# Patient Record
Sex: Female | Born: 1960 | Race: White | Hispanic: No | Marital: Single | Smoking: Current every day smoker
Health system: Southern US, Community
[De-identification: ages and names within clinical notes are randomized; demographics above are authoritative.]

## PROBLEM LIST (undated history)

## (undated) DIAGNOSIS — K279 Peptic ulcer, site unspecified, unspecified as acute or chronic, without hemorrhage or perforation: Secondary | ICD-10-CM

## (undated) DIAGNOSIS — K922 Gastrointestinal hemorrhage, unspecified: Secondary | ICD-10-CM

## (undated) DIAGNOSIS — F419 Anxiety disorder, unspecified: Secondary | ICD-10-CM

## (undated) DIAGNOSIS — R0789 Other chest pain: Secondary | ICD-10-CM

## (undated) DIAGNOSIS — K219 Gastro-esophageal reflux disease without esophagitis: Secondary | ICD-10-CM

## (undated) DIAGNOSIS — R52 Pain, unspecified: Secondary | ICD-10-CM

## (undated) DIAGNOSIS — F329 Major depressive disorder, single episode, unspecified: Secondary | ICD-10-CM

## (undated) DIAGNOSIS — G8929 Other chronic pain: Secondary | ICD-10-CM

## (undated) DIAGNOSIS — R87629 Unspecified abnormal cytological findings in specimens from vagina: Secondary | ICD-10-CM

## (undated) DIAGNOSIS — M47816 Spondylosis without myelopathy or radiculopathy, lumbar region: Secondary | ICD-10-CM

## (undated) DIAGNOSIS — M549 Dorsalgia, unspecified: Secondary | ICD-10-CM

## (undated) DIAGNOSIS — C539 Malignant neoplasm of cervix uteri, unspecified: Secondary | ICD-10-CM

## (undated) DIAGNOSIS — M797 Fibromyalgia: Secondary | ICD-10-CM

## (undated) DIAGNOSIS — F32A Depression, unspecified: Secondary | ICD-10-CM

## (undated) DIAGNOSIS — E785 Hyperlipidemia, unspecified: Secondary | ICD-10-CM

## (undated) DIAGNOSIS — G894 Chronic pain syndrome: Secondary | ICD-10-CM

## (undated) DIAGNOSIS — J449 Chronic obstructive pulmonary disease, unspecified: Secondary | ICD-10-CM

## (undated) DIAGNOSIS — M1990 Unspecified osteoarthritis, unspecified site: Secondary | ICD-10-CM

## (undated) DIAGNOSIS — T79A0XA Compartment syndrome, unspecified, initial encounter: Secondary | ICD-10-CM

## (undated) HISTORY — PX: TOTAL ABDOMINAL HYSTERECTOMY: SHX209

## (undated) HISTORY — DX: Unspecified abnormal cytological findings in specimens from vagina: R87.629

## (undated) HISTORY — DX: Fibromyalgia: M79.7

## (undated) HISTORY — DX: Anxiety disorder, unspecified: F41.9

## (undated) HISTORY — DX: Unspecified osteoarthritis, unspecified site: M19.90

## (undated) HISTORY — DX: Spondylosis without myelopathy or radiculopathy, lumbar region: M47.816

## (undated) HISTORY — DX: Compartment syndrome, unspecified, initial encounter: T79.A0XA

## (undated) HISTORY — DX: Major depressive disorder, single episode, unspecified: F32.9

## (undated) HISTORY — PX: THROAT SURGERY: SHX803

## (undated) HISTORY — PX: OTHER SURGICAL HISTORY: SHX169

## (undated) HISTORY — PX: TUBAL LIGATION: SHX77

## (undated) HISTORY — DX: Hyperlipidemia, unspecified: E78.5

## (undated) HISTORY — DX: Depression, unspecified: F32.A

## (undated) HISTORY — DX: Malignant neoplasm of cervix uteri, unspecified: C53.9

## (undated) HISTORY — DX: Peptic ulcer, site unspecified, unspecified as acute or chronic, without hemorrhage or perforation: K27.9

---

## 1998-11-28 ENCOUNTER — Ambulatory Visit (HOSPITAL_COMMUNITY): Admission: RE | Admit: 1998-11-28 | Discharge: 1998-11-28 | Payer: Self-pay | Admitting: Neurosurgery

## 1998-12-01 ENCOUNTER — Ambulatory Visit: Admission: RE | Admit: 1998-12-01 | Discharge: 1998-12-01 | Payer: Self-pay | Admitting: Internal Medicine

## 1998-12-08 ENCOUNTER — Ambulatory Visit (HOSPITAL_COMMUNITY): Admission: RE | Admit: 1998-12-08 | Discharge: 1998-12-08 | Payer: Self-pay | Admitting: Neurosurgery

## 1999-03-17 ENCOUNTER — Ambulatory Visit (HOSPITAL_COMMUNITY): Admission: RE | Admit: 1999-03-17 | Discharge: 1999-03-17 | Payer: Self-pay | Admitting: Neurosurgery

## 2000-12-12 ENCOUNTER — Ambulatory Visit (HOSPITAL_COMMUNITY): Admission: RE | Admit: 2000-12-12 | Discharge: 2000-12-12 | Payer: Self-pay | Admitting: Family Medicine

## 2000-12-12 ENCOUNTER — Encounter: Payer: Self-pay | Admitting: Family Medicine

## 2002-08-14 ENCOUNTER — Emergency Department (HOSPITAL_COMMUNITY): Admission: EM | Admit: 2002-08-14 | Discharge: 2002-08-14 | Payer: Self-pay | Admitting: Emergency Medicine

## 2002-08-14 ENCOUNTER — Encounter: Payer: Self-pay | Admitting: Emergency Medicine

## 2002-08-29 ENCOUNTER — Emergency Department (HOSPITAL_COMMUNITY): Admission: EM | Admit: 2002-08-29 | Discharge: 2002-08-29 | Payer: Self-pay | Admitting: Emergency Medicine

## 2002-08-30 ENCOUNTER — Encounter (HOSPITAL_COMMUNITY): Admission: RE | Admit: 2002-08-30 | Discharge: 2002-09-29 | Payer: Self-pay | Admitting: Emergency Medicine

## 2002-09-30 ENCOUNTER — Encounter (HOSPITAL_COMMUNITY): Admission: RE | Admit: 2002-09-30 | Discharge: 2002-10-30 | Payer: Self-pay | Admitting: General Surgery

## 2003-09-22 ENCOUNTER — Emergency Department (HOSPITAL_COMMUNITY): Admission: EM | Admit: 2003-09-22 | Discharge: 2003-09-22 | Payer: Self-pay | Admitting: Emergency Medicine

## 2003-11-11 ENCOUNTER — Ambulatory Visit (HOSPITAL_COMMUNITY): Admission: RE | Admit: 2003-11-11 | Discharge: 2003-11-11 | Payer: Self-pay | Admitting: Family Medicine

## 2004-01-15 ENCOUNTER — Ambulatory Visit (HOSPITAL_COMMUNITY): Admission: RE | Admit: 2004-01-15 | Discharge: 2004-01-15 | Payer: Self-pay | Admitting: Orthopedic Surgery

## 2004-01-19 ENCOUNTER — Emergency Department (HOSPITAL_COMMUNITY): Admission: EM | Admit: 2004-01-19 | Discharge: 2004-01-19 | Payer: Self-pay | Admitting: Emergency Medicine

## 2005-02-24 ENCOUNTER — Inpatient Hospital Stay (HOSPITAL_COMMUNITY): Admission: AD | Admit: 2005-02-24 | Discharge: 2005-03-02 | Payer: Self-pay | Admitting: Internal Medicine

## 2005-02-24 ENCOUNTER — Ambulatory Visit: Payer: Self-pay | Admitting: Internal Medicine

## 2005-02-25 ENCOUNTER — Encounter: Payer: Self-pay | Admitting: Internal Medicine

## 2005-03-30 ENCOUNTER — Ambulatory Visit (HOSPITAL_COMMUNITY): Admission: RE | Admit: 2005-03-30 | Discharge: 2005-03-30 | Payer: Self-pay | Admitting: Internal Medicine

## 2005-04-07 ENCOUNTER — Encounter (INDEPENDENT_AMBULATORY_CARE_PROVIDER_SITE_OTHER): Payer: Self-pay | Admitting: *Deleted

## 2005-04-07 ENCOUNTER — Ambulatory Visit: Payer: Self-pay | Admitting: Internal Medicine

## 2005-04-07 LAB — CONVERTED CEMR LAB
ALT: <8 units/L
AST: 11 units/L
Albumin: 4.5 g/dL
Alkaline Phosphatase: 54 units/L
Total Protein: 7.2 g/dL

## 2005-04-08 ENCOUNTER — Ambulatory Visit (HOSPITAL_COMMUNITY): Admission: RE | Admit: 2005-04-08 | Discharge: 2005-04-08 | Payer: Self-pay | Admitting: Internal Medicine

## 2005-04-13 ENCOUNTER — Ambulatory Visit (HOSPITAL_COMMUNITY): Admission: RE | Admit: 2005-04-13 | Discharge: 2005-04-13 | Payer: Self-pay | Admitting: Internal Medicine

## 2005-04-18 ENCOUNTER — Emergency Department (HOSPITAL_COMMUNITY): Admission: EM | Admit: 2005-04-18 | Discharge: 2005-04-18 | Payer: Self-pay | Admitting: Emergency Medicine

## 2005-04-20 ENCOUNTER — Ambulatory Visit (HOSPITAL_COMMUNITY): Admission: RE | Admit: 2005-04-20 | Discharge: 2005-04-20 | Payer: Self-pay | Admitting: Internal Medicine

## 2005-04-20 ENCOUNTER — Ambulatory Visit: Payer: Self-pay | Admitting: Internal Medicine

## 2005-04-27 ENCOUNTER — Ambulatory Visit: Payer: Self-pay | Admitting: Internal Medicine

## 2005-04-28 ENCOUNTER — Encounter (HOSPITAL_COMMUNITY): Admission: RE | Admit: 2005-04-28 | Discharge: 2005-05-28 | Payer: Self-pay | Admitting: Internal Medicine

## 2005-05-03 ENCOUNTER — Ambulatory Visit (HOSPITAL_COMMUNITY): Admission: RE | Admit: 2005-05-03 | Discharge: 2005-05-03 | Payer: Self-pay | Admitting: Gastroenterology

## 2005-05-11 ENCOUNTER — Emergency Department (HOSPITAL_COMMUNITY): Admission: EM | Admit: 2005-05-11 | Discharge: 2005-05-11 | Payer: Self-pay | Admitting: Emergency Medicine

## 2005-05-12 ENCOUNTER — Emergency Department (HOSPITAL_COMMUNITY): Admission: EM | Admit: 2005-05-12 | Discharge: 2005-05-12 | Payer: Self-pay | Admitting: Emergency Medicine

## 2005-05-18 ENCOUNTER — Ambulatory Visit (HOSPITAL_COMMUNITY): Admission: RE | Admit: 2005-05-18 | Discharge: 2005-05-18 | Payer: Self-pay | Admitting: Family Medicine

## 2005-06-01 ENCOUNTER — Ambulatory Visit (HOSPITAL_COMMUNITY): Admission: RE | Admit: 2005-06-01 | Discharge: 2005-06-01 | Payer: Self-pay | Admitting: Unknown Physician Specialty

## 2005-06-03 ENCOUNTER — Ambulatory Visit: Payer: Self-pay | Admitting: Family Medicine

## 2005-07-12 ENCOUNTER — Ambulatory Visit: Payer: Self-pay | Admitting: Orthopedic Surgery

## 2005-08-06 ENCOUNTER — Encounter (INDEPENDENT_AMBULATORY_CARE_PROVIDER_SITE_OTHER): Payer: Self-pay | Admitting: Internal Medicine

## 2005-08-06 LAB — CONVERTED CEMR LAB
ALT: 16 units/L
AST: 15 units/L
Alkaline Phosphatase: 91 units/L
BUN: 13 mg/dL
Calcium: 9.3 mg/dL
Chloride: 105 meq/L
Creatinine, Ser: 0.8 mg/dL
HCT: 41.1 %
MCV: 92.2 fL
Platelets: 324 10*3/uL
RDW: 13.9 %
Sed Rate: 11 mm/hr
Total Bilirubin: 0.4 mg/dL
WBC: 6.9 10*3/uL

## 2005-08-10 ENCOUNTER — Emergency Department (HOSPITAL_COMMUNITY): Admission: EM | Admit: 2005-08-10 | Discharge: 2005-08-10 | Payer: Self-pay | Admitting: Emergency Medicine

## 2005-08-21 ENCOUNTER — Emergency Department (HOSPITAL_COMMUNITY): Admission: EM | Admit: 2005-08-21 | Discharge: 2005-08-21 | Payer: Self-pay | Admitting: Emergency Medicine

## 2005-08-25 ENCOUNTER — Encounter (INDEPENDENT_AMBULATORY_CARE_PROVIDER_SITE_OTHER): Payer: Self-pay | Admitting: Internal Medicine

## 2005-09-27 ENCOUNTER — Inpatient Hospital Stay (HOSPITAL_COMMUNITY): Admission: RE | Admit: 2005-09-27 | Discharge: 2005-09-29 | Payer: Self-pay | Admitting: Obstetrics and Gynecology

## 2005-09-27 ENCOUNTER — Encounter (INDEPENDENT_AMBULATORY_CARE_PROVIDER_SITE_OTHER): Payer: Self-pay | Admitting: *Deleted

## 2005-10-19 ENCOUNTER — Ambulatory Visit: Payer: Self-pay | Admitting: Internal Medicine

## 2005-10-21 ENCOUNTER — Emergency Department (HOSPITAL_COMMUNITY): Admission: EM | Admit: 2005-10-21 | Discharge: 2005-10-21 | Payer: Self-pay | Admitting: Emergency Medicine

## 2005-11-01 ENCOUNTER — Encounter (INDEPENDENT_AMBULATORY_CARE_PROVIDER_SITE_OTHER): Payer: Self-pay | Admitting: Internal Medicine

## 2005-11-03 ENCOUNTER — Ambulatory Visit: Payer: Self-pay | Admitting: Internal Medicine

## 2005-11-23 ENCOUNTER — Ambulatory Visit: Payer: Self-pay | Admitting: Internal Medicine

## 2005-12-02 ENCOUNTER — Encounter (INDEPENDENT_AMBULATORY_CARE_PROVIDER_SITE_OTHER): Payer: Self-pay | Admitting: Internal Medicine

## 2005-12-27 ENCOUNTER — Emergency Department (HOSPITAL_COMMUNITY): Admission: EM | Admit: 2005-12-27 | Discharge: 2005-12-27 | Payer: Self-pay | Admitting: Emergency Medicine

## 2006-01-03 ENCOUNTER — Encounter (HOSPITAL_COMMUNITY): Admission: RE | Admit: 2006-01-03 | Discharge: 2006-01-22 | Payer: Self-pay | Admitting: Orthopedic Surgery

## 2006-01-07 ENCOUNTER — Encounter (INDEPENDENT_AMBULATORY_CARE_PROVIDER_SITE_OTHER): Payer: Self-pay | Admitting: Internal Medicine

## 2006-05-23 ENCOUNTER — Encounter (INDEPENDENT_AMBULATORY_CARE_PROVIDER_SITE_OTHER): Payer: Self-pay | Admitting: Internal Medicine

## 2006-08-12 ENCOUNTER — Encounter (INDEPENDENT_AMBULATORY_CARE_PROVIDER_SITE_OTHER): Payer: Self-pay | Admitting: Internal Medicine

## 2006-10-19 ENCOUNTER — Emergency Department (HOSPITAL_COMMUNITY): Admission: EM | Admit: 2006-10-19 | Discharge: 2006-10-19 | Payer: Self-pay | Admitting: Emergency Medicine

## 2006-10-20 ENCOUNTER — Ambulatory Visit: Payer: Self-pay | Admitting: Orthopedic Surgery

## 2006-10-31 ENCOUNTER — Ambulatory Visit: Payer: Self-pay | Admitting: Internal Medicine

## 2006-10-31 DIAGNOSIS — F1320 Sedative, hypnotic or anxiolytic dependence, uncomplicated: Secondary | ICD-10-CM | POA: Insufficient documentation

## 2006-10-31 DIAGNOSIS — M542 Cervicalgia: Secondary | ICD-10-CM

## 2006-10-31 DIAGNOSIS — K279 Peptic ulcer, site unspecified, unspecified as acute or chronic, without hemorrhage or perforation: Secondary | ICD-10-CM | POA: Insufficient documentation

## 2006-10-31 DIAGNOSIS — Z8719 Personal history of other diseases of the digestive system: Secondary | ICD-10-CM

## 2006-10-31 DIAGNOSIS — M79609 Pain in unspecified limb: Secondary | ICD-10-CM

## 2006-10-31 DIAGNOSIS — Z8541 Personal history of malignant neoplasm of cervix uteri: Secondary | ICD-10-CM

## 2006-10-31 DIAGNOSIS — Z87898 Personal history of other specified conditions: Secondary | ICD-10-CM

## 2006-10-31 DIAGNOSIS — K5900 Constipation, unspecified: Secondary | ICD-10-CM | POA: Insufficient documentation

## 2006-10-31 DIAGNOSIS — M129 Arthropathy, unspecified: Secondary | ICD-10-CM | POA: Insufficient documentation

## 2006-10-31 DIAGNOSIS — M545 Low back pain: Secondary | ICD-10-CM

## 2006-10-31 DIAGNOSIS — F319 Bipolar disorder, unspecified: Secondary | ICD-10-CM

## 2006-10-31 DIAGNOSIS — F411 Generalized anxiety disorder: Secondary | ICD-10-CM | POA: Insufficient documentation

## 2006-10-31 DIAGNOSIS — J309 Allergic rhinitis, unspecified: Secondary | ICD-10-CM | POA: Insufficient documentation

## 2006-10-31 DIAGNOSIS — M109 Gout, unspecified: Secondary | ICD-10-CM

## 2006-11-01 ENCOUNTER — Telehealth (INDEPENDENT_AMBULATORY_CARE_PROVIDER_SITE_OTHER): Payer: Self-pay | Admitting: Internal Medicine

## 2006-11-01 ENCOUNTER — Encounter (INDEPENDENT_AMBULATORY_CARE_PROVIDER_SITE_OTHER): Payer: Self-pay | Admitting: Internal Medicine

## 2006-11-18 ENCOUNTER — Encounter (INDEPENDENT_AMBULATORY_CARE_PROVIDER_SITE_OTHER): Payer: Self-pay | Admitting: Internal Medicine

## 2006-11-21 ENCOUNTER — Ambulatory Visit: Payer: Self-pay | Admitting: Orthopedic Surgery

## 2006-11-22 ENCOUNTER — Ambulatory Visit: Payer: Self-pay | Admitting: Internal Medicine

## 2006-11-22 DIAGNOSIS — F17200 Nicotine dependence, unspecified, uncomplicated: Secondary | ICD-10-CM

## 2006-11-22 DIAGNOSIS — M1990 Unspecified osteoarthritis, unspecified site: Secondary | ICD-10-CM | POA: Insufficient documentation

## 2006-12-01 ENCOUNTER — Telehealth (INDEPENDENT_AMBULATORY_CARE_PROVIDER_SITE_OTHER): Payer: Self-pay | Admitting: Internal Medicine

## 2006-12-07 ENCOUNTER — Telehealth (INDEPENDENT_AMBULATORY_CARE_PROVIDER_SITE_OTHER): Payer: Self-pay | Admitting: Internal Medicine

## 2006-12-14 ENCOUNTER — Telehealth (INDEPENDENT_AMBULATORY_CARE_PROVIDER_SITE_OTHER): Payer: Self-pay | Admitting: *Deleted

## 2006-12-19 ENCOUNTER — Ambulatory Visit: Payer: Self-pay | Admitting: Internal Medicine

## 2006-12-19 DIAGNOSIS — K0530 Chronic periodontitis, unspecified: Secondary | ICD-10-CM | POA: Insufficient documentation

## 2006-12-20 ENCOUNTER — Telehealth (INDEPENDENT_AMBULATORY_CARE_PROVIDER_SITE_OTHER): Payer: Self-pay | Admitting: Internal Medicine

## 2006-12-22 ENCOUNTER — Encounter (INDEPENDENT_AMBULATORY_CARE_PROVIDER_SITE_OTHER): Payer: Self-pay | Admitting: Internal Medicine

## 2006-12-23 ENCOUNTER — Ambulatory Visit (HOSPITAL_BASED_OUTPATIENT_CLINIC_OR_DEPARTMENT_OTHER): Admission: RE | Admit: 2006-12-23 | Discharge: 2006-12-23 | Payer: Self-pay | Admitting: Dentistry

## 2006-12-28 ENCOUNTER — Telehealth (INDEPENDENT_AMBULATORY_CARE_PROVIDER_SITE_OTHER): Payer: Self-pay | Admitting: Internal Medicine

## 2007-01-09 ENCOUNTER — Encounter (INDEPENDENT_AMBULATORY_CARE_PROVIDER_SITE_OTHER): Payer: Self-pay | Admitting: Internal Medicine

## 2007-02-06 ENCOUNTER — Telehealth (INDEPENDENT_AMBULATORY_CARE_PROVIDER_SITE_OTHER): Payer: Self-pay | Admitting: Internal Medicine

## 2007-02-15 ENCOUNTER — Encounter (INDEPENDENT_AMBULATORY_CARE_PROVIDER_SITE_OTHER): Payer: Self-pay | Admitting: Internal Medicine

## 2007-02-22 ENCOUNTER — Encounter (INDEPENDENT_AMBULATORY_CARE_PROVIDER_SITE_OTHER): Payer: Self-pay | Admitting: Internal Medicine

## 2007-03-22 ENCOUNTER — Encounter (INDEPENDENT_AMBULATORY_CARE_PROVIDER_SITE_OTHER): Payer: Self-pay | Admitting: Internal Medicine

## 2007-04-27 ENCOUNTER — Encounter: Payer: Self-pay | Admitting: Family Medicine

## 2007-05-04 ENCOUNTER — Telehealth (INDEPENDENT_AMBULATORY_CARE_PROVIDER_SITE_OTHER): Payer: Self-pay | Admitting: Internal Medicine

## 2007-05-12 ENCOUNTER — Ambulatory Visit (HOSPITAL_COMMUNITY): Admission: RE | Admit: 2007-05-12 | Discharge: 2007-05-12 | Payer: Self-pay | Admitting: Podiatry

## 2007-06-21 ENCOUNTER — Telehealth (INDEPENDENT_AMBULATORY_CARE_PROVIDER_SITE_OTHER): Payer: Self-pay | Admitting: Internal Medicine

## 2007-06-23 ENCOUNTER — Ambulatory Visit: Payer: Self-pay | Admitting: Internal Medicine

## 2007-08-30 ENCOUNTER — Ambulatory Visit: Payer: Self-pay | Admitting: Internal Medicine

## 2007-08-30 DIAGNOSIS — E538 Deficiency of other specified B group vitamins: Secondary | ICD-10-CM | POA: Insufficient documentation

## 2007-08-30 DIAGNOSIS — R498 Other voice and resonance disorders: Secondary | ICD-10-CM | POA: Insufficient documentation

## 2007-08-31 ENCOUNTER — Telehealth (INDEPENDENT_AMBULATORY_CARE_PROVIDER_SITE_OTHER): Payer: Self-pay | Admitting: *Deleted

## 2007-08-31 ENCOUNTER — Encounter (INDEPENDENT_AMBULATORY_CARE_PROVIDER_SITE_OTHER): Payer: Self-pay | Admitting: Internal Medicine

## 2007-08-31 LAB — CONVERTED CEMR LAB
Amphetamine Screen, Ur: NEGATIVE
Barbiturate Quant, Ur: NEGATIVE
Cocaine Metabolites: NEGATIVE
Ethyl Alcohol: <5 mg/dL (ref <10)
Marijuana Metabolite: NEGATIVE
Opiate Screen, Urine: POSITIVE — AB
Phencyclidine (PCP): NEGATIVE

## 2007-09-08 ENCOUNTER — Encounter (INDEPENDENT_AMBULATORY_CARE_PROVIDER_SITE_OTHER): Payer: Self-pay | Admitting: Internal Medicine

## 2007-09-11 ENCOUNTER — Encounter (INDEPENDENT_AMBULATORY_CARE_PROVIDER_SITE_OTHER): Payer: Self-pay | Admitting: Internal Medicine

## 2007-10-02 ENCOUNTER — Ambulatory Visit (HOSPITAL_COMMUNITY): Admission: RE | Admit: 2007-10-02 | Discharge: 2007-10-02 | Payer: Self-pay | Admitting: Family Medicine

## 2007-11-08 ENCOUNTER — Emergency Department (HOSPITAL_COMMUNITY): Admission: EM | Admit: 2007-11-08 | Discharge: 2007-11-08 | Payer: Self-pay | Admitting: Emergency Medicine

## 2009-05-12 ENCOUNTER — Inpatient Hospital Stay (HOSPITAL_COMMUNITY): Admission: EM | Admit: 2009-05-12 | Discharge: 2009-05-12 | Payer: Self-pay | Admitting: Emergency Medicine

## 2009-05-12 ENCOUNTER — Ambulatory Visit: Payer: Self-pay | Admitting: Cardiology

## 2009-06-10 ENCOUNTER — Emergency Department (HOSPITAL_COMMUNITY): Admission: EM | Admit: 2009-06-10 | Discharge: 2009-06-10 | Payer: Self-pay | Admitting: Emergency Medicine

## 2009-06-30 ENCOUNTER — Encounter (INDEPENDENT_AMBULATORY_CARE_PROVIDER_SITE_OTHER): Payer: Self-pay | Admitting: *Deleted

## 2009-09-10 ENCOUNTER — Encounter (INDEPENDENT_AMBULATORY_CARE_PROVIDER_SITE_OTHER): Payer: Self-pay | Admitting: *Deleted

## 2010-02-17 ENCOUNTER — Ambulatory Visit: Payer: Self-pay | Admitting: Internal Medicine

## 2010-02-18 ENCOUNTER — Ambulatory Visit (HOSPITAL_COMMUNITY): Admission: RE | Admit: 2010-02-18 | Discharge: 2010-02-18 | Payer: Self-pay | Admitting: Internal Medicine

## 2010-02-23 ENCOUNTER — Ambulatory Visit (HOSPITAL_COMMUNITY): Admission: RE | Admit: 2010-02-23 | Discharge: 2010-02-23 | Payer: Self-pay | Admitting: Internal Medicine

## 2010-02-23 ENCOUNTER — Ambulatory Visit: Payer: Self-pay | Admitting: Internal Medicine

## 2010-03-20 ENCOUNTER — Inpatient Hospital Stay (HOSPITAL_COMMUNITY): Admission: EM | Admit: 2010-03-20 | Discharge: 2010-03-25 | Payer: Self-pay | Admitting: Emergency Medicine

## 2010-03-20 ENCOUNTER — Ambulatory Visit: Payer: Self-pay | Admitting: Cardiology

## 2010-03-22 ENCOUNTER — Ambulatory Visit: Payer: Self-pay | Admitting: Gastroenterology

## 2010-03-24 ENCOUNTER — Encounter: Payer: Self-pay | Admitting: Cardiology

## 2010-05-11 ENCOUNTER — Encounter (INDEPENDENT_AMBULATORY_CARE_PROVIDER_SITE_OTHER): Payer: Self-pay | Admitting: *Deleted

## 2010-05-17 ENCOUNTER — Encounter: Payer: Self-pay | Admitting: Orthopedic Surgery

## 2010-05-28 NOTE — Miscellaneous (Signed)
Summary: labs 04/07/2005 liver  Clinical Lists Changes  Observations: Added new observation of ALBUMIN: 4.5 g/dL (91/47/8295 62:13) Added new observation of PROTEIN, TOT: 7.2 g/dL (08/65/7846 96:29) Added new observation of SGPT (ALT): <8 units/L (04/07/2005 11:18) Added new observation of SGOT (AST): 11 units/L (04/07/2005 11:18) Added new observation of ALK PHOS: 54 units/L (04/07/2005 11:18) Added new observation of BILI DIRECT: 0.1 mg/dL (52/84/1324 40:10)

## 2010-05-28 NOTE — Letter (Signed)
Summary: rpc chart  rpc chart   Imported By: Curtis Sites 01/16/2010 09:19:27  _____________________________________________________________________  External Attachment:    Type:   Image     Comment:   External Document

## 2010-05-28 NOTE — Miscellaneous (Signed)
Summary: CONSULTATION  Clinical Lists Changes [name], ROLLER           ACCOUNT NO.:  [id number]      MEDICAL RECORD NO.:  [id number]           PATIENT TYPE:      LOCATION:                                 FACILITY:      PHYSICIAN:  Jonette Eva, M.D.     DATE OF BIRTH:  [birth year]      DATE OF CONSULTATION:  03/22/2010   DATE OF DISCHARGE:                                    CONSULTATION         PRIMARY GASTROENTEROLOGIST:  Dr. Karilyn Cota.      PRIMARY PHYSICIAN:  Dr. Renard Matter.      PRIMARY PAIN SPECIALIST:  Dr. Hyman Bower, [place], [place].      REASON FOR CONSULTATION:  Chronic abdominal pain.      HPI:  [name] is a 50 year old female who has a significant past   medical history of anxiety and chronic pain.  She was last seen and   evaluated by Dr. Karilyn Cota for nausea in October 2011.  She had a CT scan   which showed diverticulosis and a small umbilical hernia.  It was   otherwise unremarkable.  She also had an EGD which was normal.  He   recommended ordering an ultrasound which was not performed.      She presents on a daily acid reducer which she bought at the dollar   store.  She said she cannot afford Dexilant.  She has been using her GI   cocktail.  She complains of epigastric pain and left upper quadrant pain   that is sharp.  It may be so severe that it makes her feel like she has   to bend over.  Her chest pain is a separate issue.  She has a bowel   movement every morning with a stool laxative.  She denies any blood in   her stool.  She has had 4 episodes of vomiting since October.  She   regurgitates 2 to 3 times a day.  She has intermittent nausea.  She   continues to smoke 1/2 a pack a day.  Her BMI is 28 consistent with   being overweight.      PAST MEDICAL HISTORY:   1. History of gout.   2. Peptic ulcer disease.   3. Tobacco abuse.      PAST SURGICAL HISTORY:   1. Tubal ligation.   2. Total abdominal hysterectomy.      ALLERGIES:   VICODIN.      MEDICATIONS:   1. Xanax.   2. Aspirin.   3. MS Contin 30 mg b.i.d.   4. Lovenox daily.   5. OxyContin 20 mg b.i.d. with 10 mg p.o. b.i.d.   6. Protonix daily.   7. Ambien q.h.s.   8. GI cocktail as needed.      FAMILY HISTORY:  She has a family history of coronary artery disease,   diabetes, and hypertension.      SOCIAL HISTORY:  She has a significant other.  She smokes 1/2 a pack a   day.  REVIEW OF SYSTEMS:  Patient stopped her Cymbalta after 2 weeks.  She   could not really give me an explanation why.  Review of systems is per   the HPI, otherwise all systems are negative.      PHYSICAL EXAM:  T-max 98.2.  Systolic blood pressure 117 to 100.   GENERAL:  She is in no apparent distress.  Alert and oriented x4.   HEENT:  Atraumatic, normocephalic.  Pupils equal and reactive to light.   Mouth, no oral lesions.   NECK:  Full range of motion.  No lymphadenopathy.   LUNGS:  Clear to auscultation bilaterally.   CARDIOVASCULAR:  Regular rhythm.  No murmur.  Normal S1 and S2.   ABDOMEN:  Bowel sounds present, soft, nontender, nondistended.  No   rebound or guarding.   EXTREMITIES:  No cyanosis or edema.   NEURO:  She has no focal neurologic deficits.      LABS:  White count 8.7, hemoglobin 14.8, platelets 281, creatinine 0.97.   normal hepatic function panel.  UA negative.      RADIOGRAPHIC STUDIES:  Portable chest x-ray on March 20, 2010, showed   no acute cardiopulmonary disease.      ASSESSMENT:  [name] is a 50 year old female who has chest pain and   left upper quadrant pain likely secondary to reflux and/or non-ulcer   dyspepsia.  She has a low likelihood of having esophageal spasm.      Thank you for allowing me to see [name] in consultation.  My   recommendations follow.      RECOMMENDATIONS:   1. We will order an ultrasound for tomorrow to complete her outpatient       workup.   2. B.i.d. PPI at 7 a.m. and 5 p.m.   3. Would add  imipramine for chronic pain and to treat non-ulcer       dyspepsia and depression.   4. She should follow a low-fat diet.   5. We will order a barium swallow to see if she has any evidence of       esophageal spasm as a reason for her chest pain.   6. Dr. Karilyn Cota will follow the patient on March 23, 2010.                     Jonette Eva, M.D.               SF/MEDQ  D:  03/22/2010  T:  03/22/2010  Job:  [id number]      cc:   Dr. Chrisandra Netters, M.D.   Fax: [phone]      Electronically Signed by Jonette Eva M.D. on 05/07/2010 65:78:46 PM

## 2010-05-28 NOTE — Letter (Signed)
Summary: Appointment - Missed  [name] at [facility]  [address], [place] [address]   Phone: [phone]  Fax: [phone]     Sep 10, 2009 MRN: [id number]   [name] [address] [place], [place]  [address]   Dear [name],  Our records indicate you missed your appointment on      09/10/09                  with Dr. Diona Browner            It is very important that we reach you to reschedule this appointment. We look forward to participating in your health care needs. Please contact us at the number listed above at your earliest convenience to reschedule this appointment.     Sincerely,    Glass blower/designer

## 2010-07-07 LAB — BASIC METABOLIC PANEL
BUN: 13 mg/dL (ref 6–23)
Creatinine, Ser: 0.97 mg/dL (ref 0.4–1.2)
GFR calc non Af Amer: >60 mL/min (ref >60)
Glucose, Bld: 93 mg/dL (ref 70–99)
Potassium: 3.6 mEq/L (ref 3.5–5.1)

## 2010-07-07 LAB — LIPID PANEL
Cholesterol: 239 mg/dL — ABNORMAL HIGH (ref 0–200)
LDL Cholesterol: 158 mg/dL — ABNORMAL HIGH (ref 0–99)
Triglycerides: 220 mg/dL — ABNORMAL HIGH (ref <150)

## 2010-07-07 LAB — URINALYSIS, ROUTINE W REFLEX MICROSCOPIC
Bilirubin Urine: NEGATIVE
Ketones, ur: NEGATIVE mg/dL
Nitrite: NEGATIVE
Protein, ur: NEGATIVE mg/dL
pH: 5.5 (ref 5.0–8.0)

## 2010-07-07 LAB — CARDIAC PANEL(CRET KIN+CKTOT+MB+TROPI)
CK, MB: 0.4 ng/mL (ref 0.3–4.0)
CK, MB: 0.5 ng/mL (ref 0.3–4.0)
Relative Index: INVALID (ref 0.0–2.5)
Total CK: 50 U/L (ref 7–177)
Troponin I: 0.01 ng/mL (ref 0.00–0.06)
Troponin I: <0.01 ng/mL (ref 0.00–0.06)
Troponin I: <0.01 ng/mL (ref 0.00–0.06)

## 2010-07-07 LAB — CBC
HCT: 42.5 % (ref 36.0–46.0)
MCHC: 34.8 g/dL (ref 30.0–36.0)
MCV: 88.5 fL (ref 78.0–100.0)
Platelets: 281 10*3/uL (ref 150–400)
RDW: 13.4 % (ref 11.5–15.5)

## 2010-07-07 LAB — POCT CARDIAC MARKERS
CKMB, poc: <1 ng/mL — ABNORMAL LOW (ref 1.0–8.0)
Myoglobin, poc: 41.5 ng/mL (ref 12–200)

## 2010-07-07 LAB — HEPATIC FUNCTION PANEL
Bilirubin, Direct: 0.1 mg/dL (ref 0.0–0.3)
Indirect Bilirubin: 0 mg/dL — ABNORMAL LOW (ref 0.3–0.9)
Total Protein: 7.7 g/dL (ref 6.0–8.3)

## 2010-07-07 LAB — DIFFERENTIAL
Basophils Absolute: 0 10*3/uL (ref 0.0–0.1)
Basophils Relative: 0 % (ref 0–1)
Eosinophils Relative: 4 % (ref 0–5)
Monocytes Absolute: 0.5 10*3/uL (ref 0.1–1.0)

## 2010-07-07 LAB — TSH: TSH: 1.23 u[IU]/mL (ref 0.350–4.500)

## 2010-07-08 LAB — BASIC METABOLIC PANEL
CO2: 30 mEq/L (ref 19–32)
Chloride: 98 mEq/L (ref 96–112)
Creatinine, Ser: 1.04 mg/dL (ref 0.4–1.2)
GFR calc Af Amer: >60 mL/min (ref >60)

## 2010-07-08 LAB — HEMOGLOBIN AND HEMATOCRIT, BLOOD: HCT: 43.1 % (ref 36.0–46.0)

## 2010-07-12 LAB — CBC
HCT: 43.3 % (ref 36.0–46.0)
Hemoglobin: 14.8 g/dL (ref 12.0–15.0)
RDW: 14.5 % (ref 11.5–15.5)

## 2010-07-12 LAB — D-DIMER, QUANTITATIVE: D-Dimer, Quant: 0.66 ug/mL-FEU — ABNORMAL HIGH (ref 0.00–0.48)

## 2010-07-12 LAB — COMPREHENSIVE METABOLIC PANEL
Alkaline Phosphatase: 142 U/L — ABNORMAL HIGH (ref 39–117)
BUN: 7 mg/dL (ref 6–23)
Chloride: 101 mEq/L (ref 96–112)
GFR calc non Af Amer: >60 mL/min (ref >60)
Glucose, Bld: 114 mg/dL — ABNORMAL HIGH (ref 70–99)
Potassium: 3.8 mEq/L (ref 3.5–5.1)
Total Bilirubin: 0.2 mg/dL — ABNORMAL LOW (ref 0.3–1.2)
Total Protein: 6.6 g/dL (ref 6.0–8.3)

## 2010-07-12 LAB — URINALYSIS, ROUTINE W REFLEX MICROSCOPIC
Bilirubin Urine: NEGATIVE
Hgb urine dipstick: NEGATIVE
Protein, ur: NEGATIVE mg/dL
Urobilinogen, UA: 0.2 mg/dL (ref 0.0–1.0)

## 2010-07-12 LAB — DIFFERENTIAL
Basophils Absolute: 0 10*3/uL (ref 0.0–0.1)
Basophils Relative: 1 % (ref 0–1)
Neutro Abs: 3.3 10*3/uL (ref 1.7–7.7)
Neutrophils Relative %: 55 % (ref 43–77)

## 2010-07-12 LAB — POCT CARDIAC MARKERS
CKMB, poc: <1 ng/mL — ABNORMAL LOW (ref 1.0–8.0)
CKMB, poc: <1 ng/mL — ABNORMAL LOW (ref 1.0–8.0)
Troponin i, poc: <0.05 ng/mL (ref 0.00–0.09)
Troponin i, poc: <0.05 ng/mL (ref 0.00–0.09)
Troponin i, poc: <0.05 ng/mL (ref 0.00–0.09)

## 2010-07-12 LAB — CARDIAC PANEL(CRET KIN+CKTOT+MB+TROPI): CK, MB: 1 ng/mL (ref 0.3–4.0)

## 2010-07-15 LAB — POCT CARDIAC MARKERS
CKMB, poc: <1 ng/mL — ABNORMAL LOW (ref 1.0–8.0)
Myoglobin, poc: 37.9 ng/mL (ref 12–200)
Troponin i, poc: <0.05 ng/mL (ref 0.00–0.09)

## 2010-07-15 LAB — BASIC METABOLIC PANEL
GFR calc Af Amer: >60 mL/min (ref >60)
GFR calc non Af Amer: >60 mL/min (ref >60)
Glucose, Bld: 107 mg/dL — ABNORMAL HIGH (ref 70–99)
Potassium: 4 mEq/L (ref 3.5–5.1)
Sodium: 137 mEq/L (ref 135–145)

## 2010-07-15 LAB — DIFFERENTIAL
Basophils Absolute: 0 10*3/uL (ref 0.0–0.1)
Eosinophils Relative: 5 % (ref 0–5)
Lymphocytes Relative: 37 % (ref 12–46)
Lymphs Abs: 2.2 10*3/uL (ref 0.7–4.0)
Monocytes Absolute: 0.6 10*3/uL (ref 0.1–1.0)
Monocytes Relative: 9 % (ref 3–12)

## 2010-07-15 LAB — CBC
HCT: 44.5 % (ref 36.0–46.0)
Hemoglobin: 15.4 g/dL — ABNORMAL HIGH (ref 12.0–15.0)
RBC: 4.8 MIL/uL (ref 3.87–5.11)
RDW: 14.7 % (ref 11.5–15.5)

## 2010-09-08 NOTE — Op Note (Signed)
NAMEJOMAIRA, [name]               ACCOUNT NO.:  [id number]   MEDICAL RECORD NO.:  [id number]          PATIENT TYPE:  AMB   LOCATION:  DSC                          FACILITY:  [facility]   PHYSICIAN:  Clydene Laming., D.D.S.DATE OF BIRTH:  1960/08/26   DATE OF PROCEDURE:  12/23/2006  DATE OF DISCHARGE:                               OPERATIVE REPORT   Patient was premedicated and brought into the OR under her own power and  placed on the table in supine position, where she remained throughout  the entire procedure.  After successful induction of nasal endotracheal  general anesthesia, the patient was prepped and draped in the usual  manner for an intraoral procedure.   Upper left, upper right, lower left and lower right were infiltrated and  blocked with 2% lidocaine, 1epinephrine times nine carpules, 1.8 mL per  carpule.   Upper right and upper left inverse bevel incisions, facial and palatal,  distal wedge, 2 and 15, full-thickness flaps were elevated from mesial  aspects of maxillary canines to the tuberosity.  There was very heavy  calculus interproximally.  The site was debrided, degranulated and  scaled and root planed with hand instrumentation and ultrasonic.  There  was significant cratering and bone loss interproximally between 2 and 3  with class 2 furcation distal of number 3.  The upper right was treated  with osteoplasty, reducing craters somewhat, removing spiny bony  exostosis palatal of number 2.  In the upper right, there was 2-3 wall  defect, mesial of 13 and distal of 14.  Furcations were generally intact  on the upper left.  Placed __________  bone graft at M13 and D14.  There  was further thinning and scalloping of tissues.  Flaps were repositioned  after irrigation and suture 4-0 BSS black silk suture.   On the lower left, pie-shaped distal wedge, distal of number 18,  intrasulcular incision, facial and lingual of number 18.  Full-thickness  flap elevated from  distal of 18 to the mesial aspect of 19.  There was  rather shallow and broad 2-3 wall defect distal of number 18, again very  heavy calculus.  Furcations were intact.  Scaled and root planed with  hand instrumentation and ultrasonic.  Graft to distal of number 18,  again with__________  bone graft material.  Flaps were sutured, 4-0 BSS  black silk suture.  Also, isolated flap procedure interproximal between  26 and 27.  Again there was intrasulcular incision on the facial and  lingual.  Interproximal area degranulated.  There was moderate to severe  horizontal bone loss, heavy calculus, mesial of 27, scaled and root  planed with hand instrumentation and ultrasonic.  Flaps were thinned and  apically positioned.  Suture of 4-0 BSS black silk suture.   Closed scaling and root planing, teeth 7 and 31.   There was excellent hemostasis, negligible blood loss.  No cultures or  specimens were taken.  Procedure went very smoothly and lasted  approximately two hours.  At the completion of the procedure, the  patient was returned to the recovery room, where  she remained until  discharge.  Patient was alert and postoperative instructions were  reviewed with patient in recovery.           ______________________________  Clydene Laming., D.D.S.    RJK/MEDQ  D:  12/23/2006  T:  12/24/2006  Job:  [id number]
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
## 2010-09-11 NOTE — Consult Note (Signed)
[name], [name]               ACCOUNT NO.:  [id number]   MEDICAL RECORD NO.:  [id number]          PATIENT TYPE:  EMS   LOCATION:  ED                            FACILITY:  [facility]   PHYSICIAN:  Tilda Burrow, M.D. DATE OF BIRTH:  March 22, 1961   DATE OF CONSULTATION:  08/21/2005  DATE OF DISCHARGE:  08/21/2005                                   CONSULTATION   CHIEF COMPLAINT:  Recurrent lower abdominal pain.   This 50 year old female is seen earlier this week in our office for her  continued complaints of lower abdominal pain since November which she  described as incapacitating and is seen in the emergency room. She began her  menses.   GYN history is interesting that the patient has a three-year history of  irregular menses. She allegedly was in fine overall shape as recently as  last November. She was treated at that time by primary care physician for  menstrual irregularity with Premarin and Provera. She relates that the  discomfort beginning about that time. She gives no GI etiology to her pain.  She claims the pain is all the time, constant, all the way across her lower  abdomen. She points to a line approximately even with the anterior superior  iliac crest from her left side to right side. There are no surgical scars in  that area. She has had no nausea, vomiting or diarrhea except that she does  state that she has thrown up in response to the Percocet which she is  currently prescribed. She perceives herself to not be able to take the pain  anymore.   The exam is surprisingly unremarkable. Abdomen is soft without guarding or  rebound. External genitalia is normal. Vaginal exam has nontender vaginal  side walls. Today exam, there is light menstrual blood on the glove. Bladder  gives her a sensation of pressure but is not perceived as painful. Uterus is  minimally sensitive with manipulation, increasing with pressure sensation  but causing no guarding, rebound or other body  response.   From review of old records, performed last week in the office, she has had a  negative small bowel series and a rather extensive outpatient workup to  date.   IMPRESSION:  Chronic lower abdominal pain, uncertain etiology. No acute  change in the status. Doubtful gynecologic etiology to pain.   PLAN:  We will reassess the patient as an outpatient. Have her return to the  office as already scheduled for next week. Prescription given for Dilaudid 2  mg tablets x20 tablets for the weekend and next week. It is not my plan to  keep her on such high doses of medicines but in the absence of any acute  change in her status I will simply arrange follow up next week. Labs pending  and to be called to me upon completion. This includes CBC and MET-7.      Tilda Burrow, M.D.  Electronically Signed     JVF/MEDQ  D:  08/21/2005  T:  08/22/2005  Job:  [id number]   cc:   [facility]

## 2010-09-11 NOTE — Discharge Summary (Signed)
[name], [name]               ACCOUNT NO.:  [id number]   MEDICAL RECORD NO.:  [id number]          PATIENT TYPE:  INP   LOCATION:  A428                          FACILITY:  [facility]   PHYSICIAN:  Tilda Burrow, M.D. DATE OF BIRTH:  05-Feb-1961   DATE OF ADMISSION:  09/27/2005  DATE OF DISCHARGE:  06/06/2007LH                                 DISCHARGE SUMMARY   ADMISSION DIAGNOSES:  1.  Heavy and irregular periods.  2.  Chronic pelvic pain x6 months.  3.  Benzodiazepine dependency x10 years.   DISCHARGE DIAGNOSES:  1.  Heavy and irregular periods.  2.  Chronic pelvic pain x6 months.  3.  Endometriosis.  4.  Benzodiazepine dependency x10 years.   PROCEDURE:  September 27, 2005, total abdominal hysterectomy with bilateral  salpingo-oophorectomy and appendectomy.   DISCHARGE MEDICATIONS:  1.  Tylox 40 tablets one p.o. q.i.d. x10 days.  2.  Xanax 1 mg tablets 120 dispensed one twice daily with two at bedtime,      refilled x1.  3.  Vivelle-Dot 0.1 mg to skin twice weekly indefinitely, refill x1 year.   HOSPITAL SUMMARY:  This 50 year old female with chronic pelvic pain x6  months was admitted for hysterectomy.  See HPI.  She underwent hysterectomy  and bilateral salpingo-oophorectomy with excision of endometriosis implants  in the cul-de-sac on September 27, 2005.  Postoperatively she did well with  postoperative hemoglobin 12 compared to preoperative of 13.8.  She was  afebrile.  She was actually doing quite well on her usual dose of Xanax.  She was discharged on her usual stable dose of Xanax 1 mg twice during the  day and 2 mg at bedtime, along with Tylox 40 tablets to last her 10 days  with follow-up in one week for staple removal.   ADDENDUM:  It is our goal to reduce benzodiazepine dependency in the future.  Patient is verbally supportive of this plan.      Tilda Burrow, M.D.  Electronically Signed     JVF/MEDQ  D:  09/29/2005  T:  09/29/2005  Job:  [id number]   cc:    [facility]   [facility]

## 2010-09-11 NOTE — Op Note (Signed)
[name], [name]               ACCOUNT NO.:  [id number]   MEDICAL RECORD NO.:  [id number]          PATIENT TYPE:  INP   LOCATION:  A302                          FACILITY:  [facility]   PHYSICIAN:  R. Roetta Sessions, M.D. DATE OF BIRTH:  [birth year]   DATE OF PROCEDURE:  03/01/2005  DATE OF DISCHARGE:                                 OPERATIVE REPORT   PROCEDURE:  Diagnostic esophagogastroduodenoscopy.   ENDOSCOPIST:  Gerrit Friends. Rourk, M.D.   INDICATIONS FOR PROCEDURE:  The patient is a 50 year old lady admitted to  the hospital with bloody diarrhea.  Colonoscopy demonstrated inflammatory  change consistent with ischemic colitis.  Biopsies remain.  She has  recovered from this illness, but does complain of chronic upper abdominal  discomfort and some reflux symptoms. She has  been taking Goody powders on a  regular basis.  She has requested that she have an EGD prior to discharge.  This is not unreasonable.  EGD is now being done for diagnostic purposes.  The approach has been discussed with the patient at length.  The potential  risks, benefits, and alternatives have been reviewed; questions answered;  she is agreeable.  Please see documentation in the medical record.   PROCEDURE NOTE:  O2 saturation, blood pressure, pulse, and respirations were  monitored throughout the entire procedure.   CONSCIOUS SEDATION:  Versed 6 mg IV, Demerol 150 mg IV in divided doses,  Phenergan 25 mg slow to __________ IV push.  Cetacaine spray for topical  oropharyngeal anesthesia.   INSTRUMENT:  Olympus video chip system.   FINDINGS:  Examination of the tubular esophagus revealed no mucosal  abnormalities.  The GE junction was easily traversed.   STOMACH:  The gastric cavity was empty.  It insufflated well with air.  A  thorough examination of the gastric mucosa including a retroflex view of the  proximal stomach and esophagogastric junction demonstrated prepyloric antral  erosions, otherwise, gastric  mucosa was normal.  Pylorus patent and easily  traversed.  Examination of the bulb and second portion was undertaken. The  patient had multiple bulbar erosions and a 6-mm bulbar ulcer with a clean  base.   THERAPEUTIC/DIAGNOSTIC MANEUVERS:  None.   The patient tolerated the procedure well and was reacted in endoscopy.   IMPRESSION:  1.  Normal esophagus.  2.  Antral erosions, otherwise normal stomach.  3.  Patent pylorus.  4.  Normal bulbar erosions with a 6-mm bulbar ulcer, otherwise D2, D2      appeared normal.   RECOMMENDATIONS:  1.  No aspirin powders, Advil, etcetera of any sort.  These are      contraindicated.  2.  [name] out a course of oral Cipro and Flagyl.  3.  Protonix b.i.d. x 1 month and once daily thereafter.  4.  Check H. pylori serologies.  5.  Diet as tolerated.  6.  From a GI standpoint this nice lady could be discharged at any time.      Jonathon Bellows, M.D.  Electronically Signed     RMR/MEDQ  D:  03/01/2005  T:  03/01/2005  Job:  [id number]   cc:   Madelin Rear. Sherwood Gambler, MD  Fax: [phone]

## 2010-09-11 NOTE — Op Note (Signed)
[name], [name]               ACCOUNT NO.:  [id number]   MEDICAL RECORD NO.:  [id number]          PATIENT TYPE:  INP   LOCATION:  A428                          FACILITY:  [facility]   PHYSICIAN:  Tilda Burrow, M.D. DATE OF BIRTH:  1960-12-16   DATE OF PROCEDURE:  DATE OF DISCHARGE:                                 OPERATIVE REPORT   PREOPERATIVE DIAGNOSIS:  Pelvic pain, heavy and irregular menses, chronic  pelvic pain.   POSTOPERATIVE DIAGNOSIS:  Pelvic pain, heavy and irregular menses, chronic  pelvic pain, endometriosis.   PROCEDURE:  Total abdominal hysterectomy, bilateral salpingo-oophorectomy,  appendectomy.   SURGEON:  Tilda Burrow, M.D.   ASSISTANTAlinda Money, RN.   ANESTHESIA:  General.   SPECIMENS:  Uterus, tubes, and ovaries.  Specimen to lab.   ESTIMATED BLOOD LOSS:  150 cc.   COMPLICATIONS:  None.   DESCRIPTION OF PROCEDURE:  The patient was taken to the operating room,  prepped and draped for lower abdominal surgery.  Foley catheter in place.  Vaginal prepping performed.  A Pfannenstiel incision was performed with an  18 cm long incision with the excision of a 4 cm ellipse of skin and fatty  tissue to avoid retraction at the scar.  The fascia was opened transversely  in a standard stainless steel technique.  The rectus muscle split in the  midline, and peritoneal cavity entered carefully by elevating the peritoneal  services and cautiously entering without risk to bowel.  The peritoneum was  opened in the midline.  A Balfour retractor positioned with a rolled-up  green towel placed beneath the retractor to elevate it above the pelvis.  Three laparotomy moistened tapes were used to elevate the bowel out of the  surgical field.  The uterus is grasped with Lahey thyroid tenaculum,  elevated the round ligaments, doubly ligated, clamped, cut, and suture  ligated.  The pelvis was inspected carefully, and there was this clear,  fibrotic area, particularly over  the left uterosacral ligament and to a  lesser degree in the pouch of Douglas, and a few on the right uterosacral  ligament.  These were addressed during the surgery with excision of these  portions of the peritoneal cavity, all except the two tiny implants with the  bowel.  The decision was made because of this, and the possibility that  there was endometriosis in the right ovary.  It was decided to remove both  tubes and ovaries bilaterally, as per preop discussions.  After the  infundibulopelvic ligaments were isolated by entering retroperitoneal and  identifying the ureters deep in the pelvis, well out of harm's way, we cross  clamped the infundibulopelvic ligaments with Kelly clamp, suture ligated  them.  The uterine vessels were skeletonized on either side, being careful  to excise the peritoneum surfaces that were involved in the nodular  processes.  The left uterosacral ligament covering was sent as a separate  specimen.  It peeled off nicely with a large area of approximately 3 cm  circumference.  The uterine vessels were then clamped with a curved Haney  clamp, Tresa Endo  clamp for back-bleeding, transected and suture ligated.  The  upper and lower cardinal ligaments were serially clamped, cut, and suture  ligated on either side with curved Heaney clamps used as knife dissection  and 0 chromic suture ligature.   Upon reaching low of the cervical vaginal angle, with the bladder flap  having been pushed in quite nicely, we were able to enter the anterior  cervical vaginal fornix with a single knife stab incision, then amputate the  cervix off of the vaginal cuff.  The left uterosacral insertion area was  somewhat thickened and fibrotic, and some extra trimming was necessary in  this area to remove the cervical tissues.  The vaginal cuff was then closed  with a figure-of-eight suture at each angle and then running 2-0 chromic in  between, resulting in satisfactory hemostasis.  The pelvis  was irrigated,  and the denuded surfaces inspected and found satisfactorily hemostatic.  The  bladder flap had been developed quite wide, and the peritoneum covered over  the hysterectomy and vaginal cuff nicely.  A moistened gauze was placed in  the pelvis after irrigation was completed and hemostasis confirmed.  We then  proceeded towards appendectomy.   APPENDECTOMY:  An appendectomy was then performed by identifying the  appendix in its anterior position.  There were very firm attachments to the  right lower quadrant.  The appendix itself was not particularly mobile.  The  mesoappendix was separated into segments with hemostats.  These segments  were cross-clamped with transection of the appendiceal mesentery (meso-  appendix) and then we ligated around each of the pedicles.  The appendix  itself was cross-clamped with two hemostats, transected between them with a  knife, and then ligated securely.   Irrigation was performed and hemostasis confirmed.   Laparotomy equipment was removed.  The peritoneum closed with running 2-0  chromic.  Fascia closed with interrupted suture in the pyramidalis muscle  area, then two-layered closure of the fascia.  Irrigation again was  performed to subcutaneous tissues.  Then the subcutaneous fatty tissue was  closed with 2-0 chromic.  The skin stapled together with excellent tissue-  edge approximation.  EBL 150 cc.      Tilda Burrow, M.D.  Electronically Signed     JVF/MEDQ  D:  09/27/2005  T:  09/27/2005  Job:  [id number]

## 2010-09-11 NOTE — Discharge Summary (Signed)
NAMEANUOLUWAPO, MEFFERD               ACCOUNT NO.:  [id number]   MEDICAL RECORD NO.:  [id number]          PATIENT TYPE:  INP   LOCATION:  A302                          FACILITY:  [facility]   PHYSICIAN:  Madelin Rear. Sherwood Gambler, MD  DATE OF BIRTH:  18-Apr-1961   DATE OF ADMISSION:  02/24/2005  DATE OF DISCHARGE:  11/07/2006LH                                 DISCHARGE SUMMARY   DISCHARGE MEDICATIONS:  1.  Prilosec 20 mg p.o. daily.  2.  Percocet 10/650 p.r.n. q.4 h.  3.  Abstinence from aspirin.  4.  Discontinuation of Prempro.   DISCHARGE DIAGNOSES:  1.  Hematochezia.  2.  Peptic ulcer disease.  3.  Gastroesophageal reflux disease.   SUMMARY:  Patient was admitted with abdominal pain, diarrhea and rectal  bleeding.  Of concern on her differential was infectious inflammatory bowel  disease and/or ischemic colitis.  She was seen in consultation by  gastroenterology and subsequently underwent colonoscopy.  She also underwent  EGD which showed multiple antral erosions and gastritis felt to be secondary  to nonsteroidal anti-inflammatory agent use.  Day of discharge she was  considerably improved and was discharged for followup in office for repeat  H&H.  Incidentally noted at the time of discharge was onset of  perimenopausal menorrhagia and GYN followup was anticipated.      Madelin Rear. Sherwood Gambler, MD  Electronically Signed     LJF/MEDQ  D:  03/21/2005  T:  03/21/2005  Job:  [id number]

## 2010-09-11 NOTE — Consult Note (Signed)
[name], [name]               ACCOUNT NO.:  [id number]   MEDICAL RECORD NO.:  [id number]          PATIENT TYPE:  INP   LOCATION:  A222                          FACILITY:  [facility]   PHYSICIAN:  [name], M.D. DATE OF BIRTH:  06/12/1960   DATE OF CONSULTATION:  02/24/2005  DATE OF DISCHARGE:                                   CONSULTATION   REASON FOR CONSULTATION:  Hematochezia, abdominal pain, colitis.   HISTORY OF PRESENT ILLNESS:  The patient is a 50 year old Caucasian female  who has had a 3-week history of diarrhea. She has had some associated lower  abdominal pain, bloating and nausea. The last 24 hours she has had  progressive diarrhea and was up the entire night prior to admission with  diarrhea. She saw Dr. Sherwood Gambler in the office today and she developed large  volume hematochezia while there. She passed blood clots as well. She denies  any dysuria, hematuria. No vomiting. No heart burn. She has not had any  recent antibiotic use. She has had some weakness and dizziness over the last  several weeks but she felt this was related to Prempro which she had  recently started. She has had these side effects to this medication  previously. She has had some headache as well. She has had vaginal bleeding  for which she had started the Prempro. The last 2 days she only had a small  amount of spotting. She denies any recent travel abroad, any ill contacts.  She has not been camping. No history of chronic diarrhea in the past.   MEDICATIONS PRIOR TO ADMISSION:  1.  Xanax 1 mg q.h.s.  2.  Diazepam t.i.d. p.Jessican.  3.  Hydrocodone 7.5 mg/650 mg q.6h p.Jessican.   ALLERGIES:  No known drug allergies.   She denies any NSAID or aspirin use.   PAST MEDICAL HISTORY:  1.  History of cervical cancer at age 53, status post surgery.  2.  Bilateral tubal ligation 7 years ago.  3.  Approximately 10 years ago she reports having a history of bleeding      peptic ulcer disease with prolonged  hospitalization of about 1 month.      She was treated for H. pylori at the time. Her weight dropped to about      60 pounds. She die not require surgery however.   FAMILY HISTORY:  Negative for colorectal cancer, chronic GI illnesses or  inflammatory bowel disease.   SOCIAL HISTORY:  She is married and has two children and two step-children.  She smokes about a pack of cigarettes daily. Denies any alcohol use or drug  use.   REVIEW OF SYSTEMS:  See HPI for GI. CONSTITUTIONAL: No weight loss.  GENITOURINARY:  See HPI, in addition she has had irregular menses. She may  skip several months in a row then when she does have a menstrual cycle it  lasts for 3-4 weeks at a time.   PHYSICAL EXAMINATION:  VITAL SIGNS:  Temperature 98.9, pulse 72, respiration  20, blood pressure 105/52. Height 64 inches. Weight 155.  GENERAL:  A  pleasant, well-developed, well-nourished Caucasian female in no  acute distress.  SKIN:  Warm and dry, no jaundice.  HEENT:  Pupils are equal, round and reactive to light. Conjunctivae are  pink, sclerae non icteric. Oropharyngeal mucosa moist and pink, no lesions  or exudate.  NECK:  No lymphadenopathy or thyromegaly.  CHEST:  Lungs are clear to auscultation.  CARDIAC EXAM:  Reveals regular rate and rhythm, normal S1, S2, no murmurs,  rubs, or gallops.  ABDOMEN:  Positive bowel sounds, soft, nondistended. She has moderate  tenderness throughout the right abdomen to deep palpation. She also has  moderate tenderness in the left lower quadrant region. No organomegaly or  masses. No rebound tenderness or guarding. No abdominal bruits or hernias.  EXTREMITIES:  No edema.   LABORATORY DATA:  White count 14,200, hemoglobin 13.6, hematocrit 38.9,  platelets 306,000. Sodium 135, potassium 5, BUN 9, creatinine 0.8, glucose  113, total bilirubin 0.4, alkaline phosphatase 68, SGOT 21, SGPT 21, albumin  3.7, amylase 32, lipase 18.   IMPRESSION:  Ms. [name] is a 50 year old  Caucasian female with a several-  week history of diarrhea who has developed large volume hematochezia in the  last 24 hours. She has associated abdominal pain, predominantly right-sided.  No recent history of antibiotic use. Remote history of bleeding peptic ulcer  disease. She has mild leukocytosis. Expect lower GI bleed secondary to  colitis, differential diagnosis includes infectious, inflammatory bowel  disease, and ischemia.   RECOMMENDATIONS:  1.  Colonoscopy.  2.  Will follow up stool studies.  3.  CBC in the morning.      Tana Coast, P.AJonathon Bellows, M.D.  Electronically Signed    LL/MEDQ  D:  02/24/2005  T:  02/24/2005  Job:  [id number]

## 2010-09-11 NOTE — H&P (Signed)
[name], [name]               ACCOUNT NO.:  [id number]   MEDICAL RECORD NO.:  [id number]          PATIENT TYPE:  AMB   LOCATION:  DAY                           FACILITY:  [facility]   PHYSICIAN:  Tilda Burrow, M.D. DATE OF BIRTH:  [birth year]   DATE OF ADMISSION:  09/27/2005  DATE OF DISCHARGE:  LH                                HISTORY & PHYSICAL   CHIEF COMPLAINT:  Heavy and prolonged menses, chronic pelvic pain x6 months,  uncertain etiology.   HISTORY OF PRESENT ILLNESS:  This 50 year old female has been seen in our  office with recurrent visits for lower abdominal and pelvic pain since  November, 2006.  She was first seen on March 10, 2005 for complaints of  irregular menses.  She had para menopausal symptoms, hot flashes, bleeding  up to three times per month with six months of that.  She was evaluated and  noted to be bleeding, given Megace to control her bleeding.  She was  subsequently cycled with Provera.  She gives a three year history of  irregular menses.  She was overall in fine shape with abdominal pain as late  as November.  At that point in time, she was hospitalized for several days  for the abdominal pain evaluation by Dr. Sherwood Gambler and Dr. Karilyn Cota.  The  evaluation resulted in a discharge diagnosis of hematochezia, GERD, and  peptic ulcer disease.  She was subsequently discharged home.  During that  evaluation, she had abdominal MRI which was unremarkable from a GYN  standpoint.  She had a pelvic ultrasound, which showed a small anterior  uterus with a normal premenopausal thickness endometrium.  She has small  nodules in the ovaries that were within normal limits for functional  ovaries.  There was a tubular structure in the right adnexa that could  represent a small hydrosalpinx.   She has been seen in our office several times.  She perceives herself as not  being able to take the pain anymore.  She has been in the emergency room on  August 21, 2005.  We were  given considerations to proceeding with laparoscopy  to evaluate the pain, given the thoroughness of her prior evaluation.  [place] does not wish to proceed with the lesser procedure but is strongly  committed to a single surgery that would include a hysterectomy at that  time.  Given her firm commitment to this, hysterectomy is considered our  best alternative that is acceptable to the patient.  We are therefore  proceeding with hysterectomy.  We have had lengthy discussion of the pro's  and con's of ovarian preservation.  Evaluna understands that we will preserve  the ovaries unless there is specific visible abnormalities noted at the time  of the surgery.  If that is the case, she agrees to surgical removal with  tubes and/or ovaries if abnormalities are identified.   REVIEW OF SYSTEMS:  Negative for bowel abnormality.  Bowel function  reportedly normal at this time.  Voiding is considered normal at 5-6 times  per day, 9 at night.  She does complain  of intermittent abdominal pain all  night with intermittent waking.   PAST MEDICAL HISTORY:  Notable for longstanding use of Xanax.  She has been  on three Xanax a day for several years.   PHYSICAL EXAMINATION:  GENERAL:  A well-dressed, appropriately attired  Caucasian female.  VITAL SIGNS:  Weight 156-1/2 pounds.  Blood pressure 110/72.  HEENT:  Pupils are equal, round and reactive.  Extraocular movements are  intact.  CARDIOVASCULAR:  Unremarkable.  BREASTS:  Deferred.  ABDOMEN:  Nontender without masses.  EXTERNAL GENITALIA:  Normal.  Multiparous female.  Vaginal exam shows  adequate support.  Cervix is mobile.  She describes the pain as related to  uterocervical manipulation.  Interestingly, she did complain of side wall  discomfort and equal discomfort over the palpation of the bowel.  The cervix  is the most tender, and bladder is slightly less tender than the cervix.   IMPRESSION:  Chronic pelvic pain, dysmenorrhea, dyspareunia,  longstanding  benzodiazepine dependence.   PLAN:  Total abdominal hysterectomy with preservation of ovaries.  Probable  appendectomy on June 4.  Bowel prep to be performed preoperatively.   ADDENDUM:  The patient is made specifically clear that I cannot guarantee  this will resolve her pain complaints but will absolutely eliminate menses  and allow thorough exploration of the abdomen.  Patient understands and  clearly acknowledges her desire for this treatment.      Tilda Burrow, M.D.  Electronically Signed     JVF/MEDQ  D:  09/26/2005  T:  09/26/2005  Job:  [id number]

## 2010-09-11 NOTE — Op Note (Signed)
[name], [name]               ACCOUNT NO.:  [id number]   MEDICAL RECORD NO.:  [id number]          PATIENT TYPE:  INP   LOCATION:  A222                          FACILITY:  [facility]   PHYSICIAN:  R. Roetta Sessions, M.D. DATE OF BIRTH:  1961-02-18   DATE OF PROCEDURE:  02/25/2005  DATE OF DISCHARGE:                                 OPERATIVE REPORT   PROCEDURE:  Colonoscopy with ileoscopy, biopsy.   INDICATIONS FOR PROCEDURE:  The patient is a 50 year old lady admitted to  the hospital with recent abdominal cramps and bloody diarrhea. It is notable  that she is a smoker. She admits to taking Goody's Powders on a regular  basis and was recently started on Prempro.   Colonoscopy is now being done. This approach has been discussed with the  patient at length. Potential risks, benefits, and alternatives have been  reviewed and questions answered. Please see documentation in the medical  record. Today, her white count is 12.4, hemoglobin is 12.3.   PROCEDURE NOTE:  The patient was placed in the left lateral decubitus  position. O2 saturation, blood pressure, pulse, and respirations were  monitored throughout the entire procedure. Conscious sedation with Versed 13  mg IV and Demerol 250 mg IV, Phenergan 25 mg in diluted slow IV push.   INSTRUMENT:  Olympus video chip system.   FINDINGS:  Digital rectal exam revealed no abnormalities.   ENDOSCOPIC FINDINGS:  Prep was good.   Rectum:  Examination of the rectal mucosa including retroflexed view of the  anal verge revealed only internal hemorrhoids.   Colon:  Colonic mucosa was surveyed from the rectosigmoid junction through  the left, transverse, and right colon to the area of the appendiceal  orifice, ileocecal valve, and cecum. These structures were well seen and  photographed for the record. Terminal ileum was intubated to 10 cm. From  this level, the scope was slowly withdrawn, and all previously mentioned  mucosal surfaces were  again seen. The patient had scattered sigmoid  diverticula beginning in the area of the proximal descending colon and  extending to the cecum. There were multiple areas of ulcerations. There were  long linear ulcers as wide as 1 cm and as long as a good 7 to 8 cm. There  were multiple. Some of these were stellate shaped. Others were linear and  spanning folds. The intervening mucosa appeared relatively normal. Terminal  ileal mucosa appeared normal. The ulcerated mucosa in the area of the  splenic flexure was biopsied for histologic study. The patient tolerated the  procedure very well and was reactive to endoscopy   IMPRESSION:  1.  Internal hemorrhoids. Otherwise normal rectum.  2.  Sigmoid diverticula.  3.  Scattered areas of ulceration from the splenic flexure all the way to      the cecum. Normal terminal ileum. Status post biopsy.   DISCUSSION:  I suspect today's findings are a result of ischemic or  segmental colitis, particularly in the setting of recent estrogen therapy in  a smoker.   The patient also admits to regular Goody's Powder use which could exacerbate  the above condition. Less likely this is an infectious process or new onset  Crohn's colitis.   RECOMMENDATIONS:  1.  Follow up on pathology.  2.  Clinically, the patient is doing much better. We will advance her to a      low residue diet.  3.  Smoking cessation encouraged.  4.  We will go ahead and finish out a course of oral antibiotic therapy.      Follow up on biopsies. Have strongly admonished her to avoid aspirin and      NSAIDs.      Jonathon Bellows, M.D.  Electronically Signed     RMR/MEDQ  D:  02/25/2005  T:  02/25/2005  Job:  [id number]

## 2010-09-17 ENCOUNTER — Ambulatory Visit (HOSPITAL_COMMUNITY)
Admission: RE | Admit: 2010-09-17 | Discharge: 2010-09-17 | Disposition: A | Discharge status: Home / Self Care | Payer: PRIVATE HEALTH INSURANCE | Source: Ambulatory Visit | Attending: Family Medicine | Admitting: Family Medicine

## 2010-09-17 ENCOUNTER — Other Ambulatory Visit (HOSPITAL_COMMUNITY): Payer: Self-pay | Admitting: Family Medicine

## 2010-09-17 DIAGNOSIS — R490 Dysphonia: Secondary | ICD-10-CM

## 2010-09-17 DIAGNOSIS — R0789 Other chest pain: Secondary | ICD-10-CM | POA: Insufficient documentation

## 2010-11-12 ENCOUNTER — Ambulatory Visit: Payer: PRIVATE HEALTH INSURANCE | Admitting: Orthopedic Surgery

## 2010-11-25 IMAGING — CT CT ANGIO CHEST
2 of 5 series · 18 of 36 positions shown · IV contrast (APPLIED)
Comparison: Chest radiograph performed 05/11/2009

CLINICAL DATA: Chest pain and left arm pain.  Elevated D-dimer.

CT ANGIOGRAPHY CHEST WITH CONTRAST
TECHNIQUE: Multidetector CT imaging of the chest was performed
using the standard protocol during bolus administration of
intravenous contrast.  Multiplanar CT image reconstructions
including MIPs were obtained to evaluate the vascular anatomy.
Contrast:  80 mL of Omnipaque 300 IV contrast

[Series 8: pulm embolism 1.0 b25f thins · axial · 0.69mm/px · z∈[-354,-98]mm · 15 of 285 slices shown]
[im 15/285  lung]
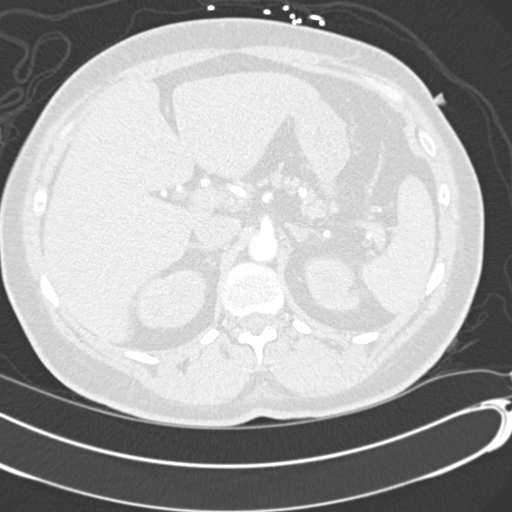
[im 29/285  mediastinal]
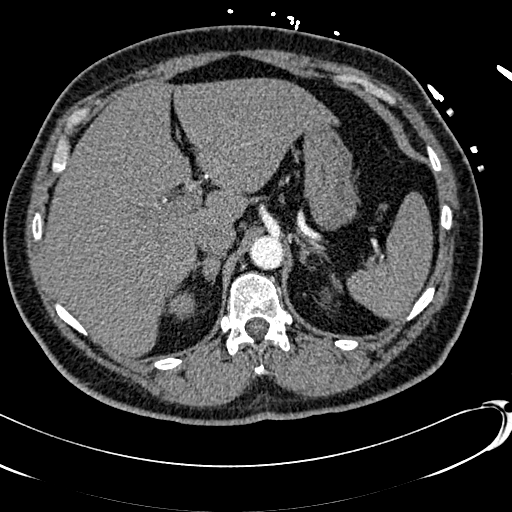
[im 57/285  lung]
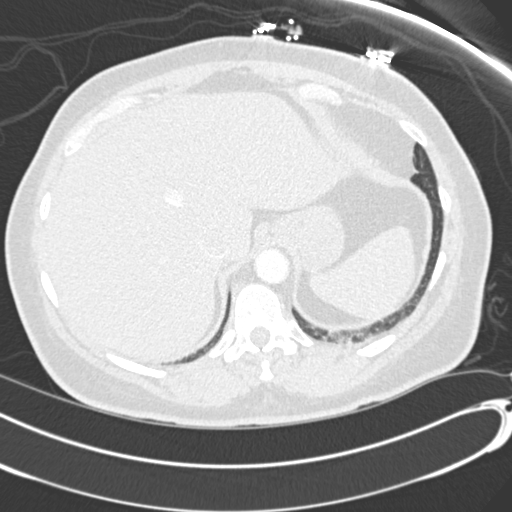
[im 72/285  mediastinal]
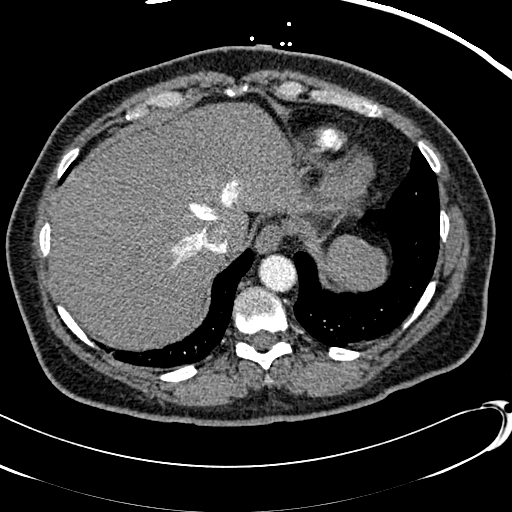
[im 86/285  lung]
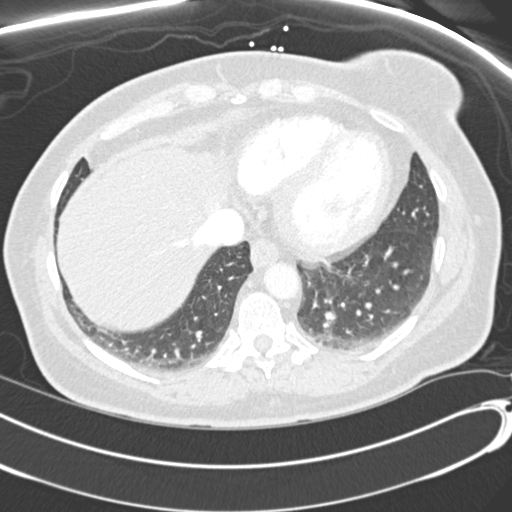
[im 100/285  mediastinal]
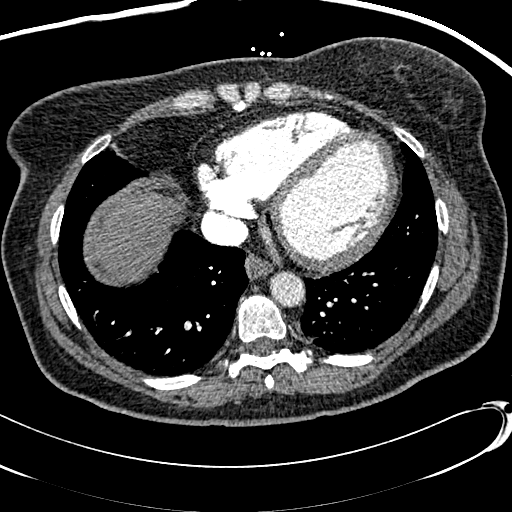
[im 128/285  lung]
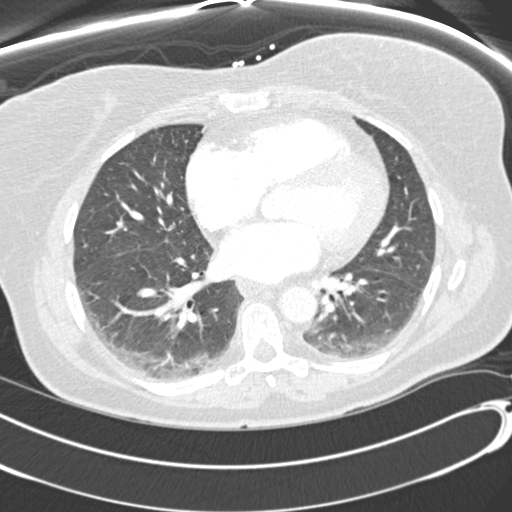
[im 143/285  mediastinal]
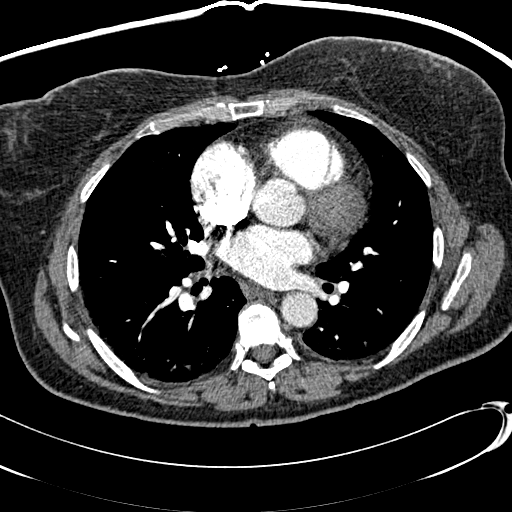
[im 157/285  lung]
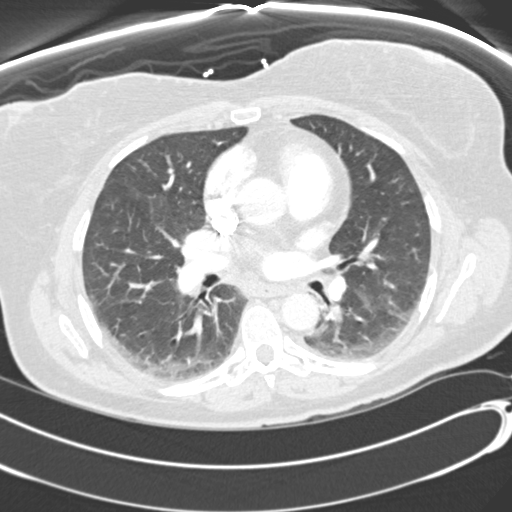
[im 185/285  mediastinal]
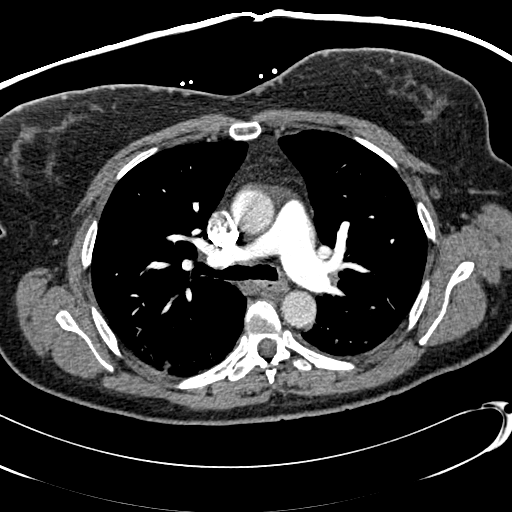
[im 199/285  lung]
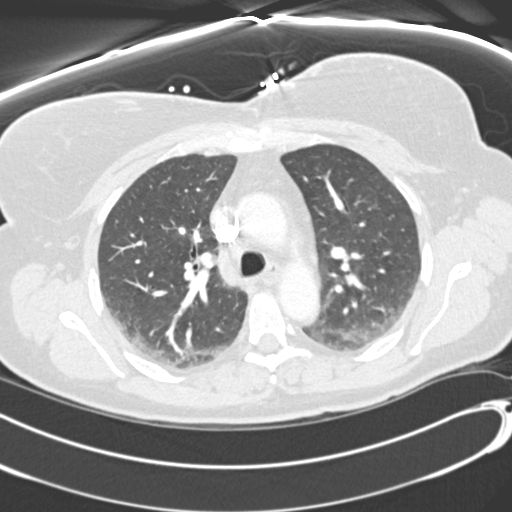
[im 214/285  mediastinal]
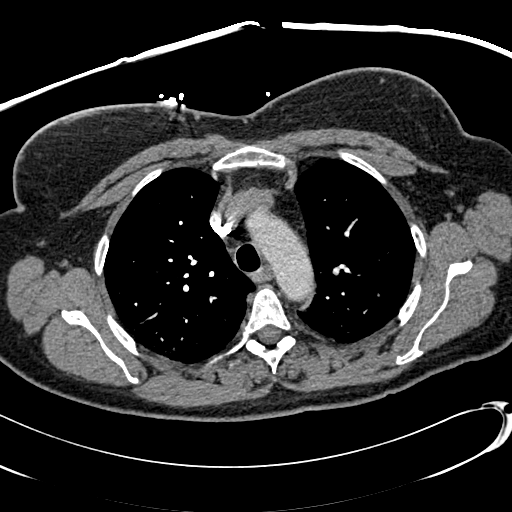
[im 228/285  lung]
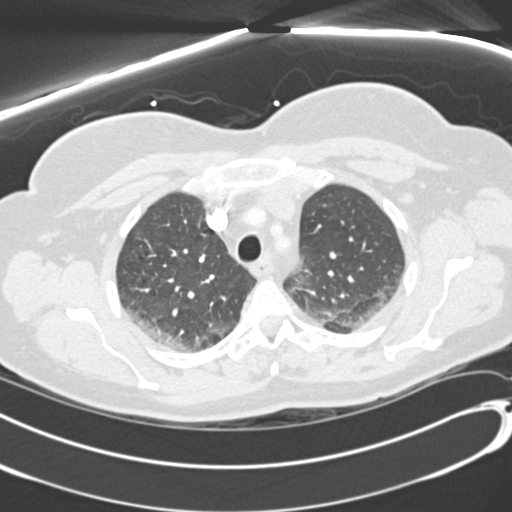
[im 256/285  mediastinal]
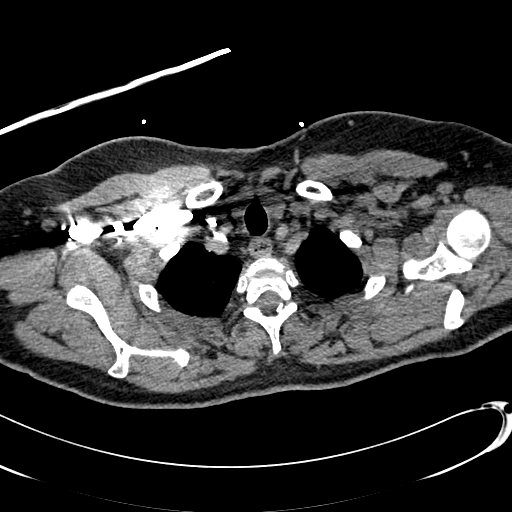
[im 270/285  lung]
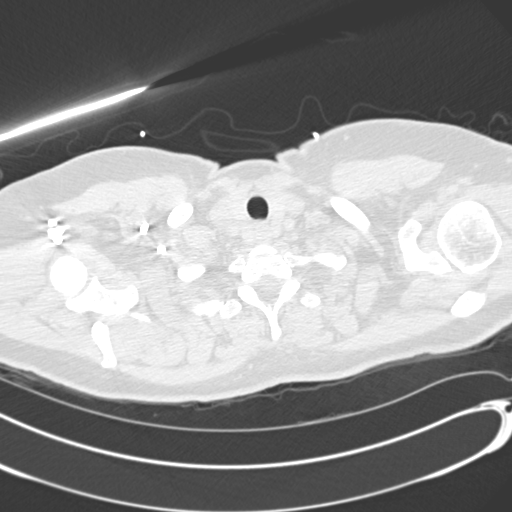

[Series 602: cor · coronal · 0.69mm/px · 3 of 99 slices shown]
[im 20/99  mediastinal]
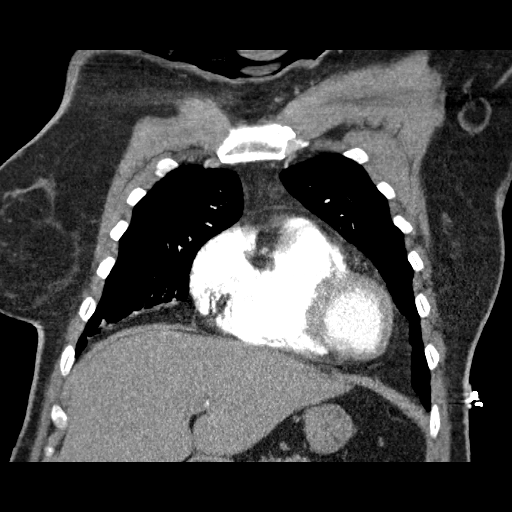
[im 40/99  mediastinal]
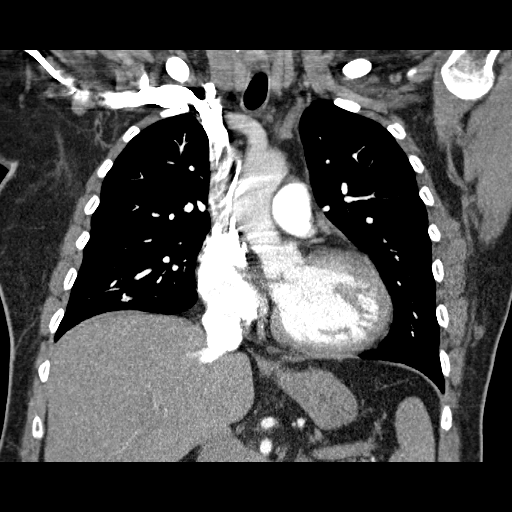
[im 59/99  mediastinal]
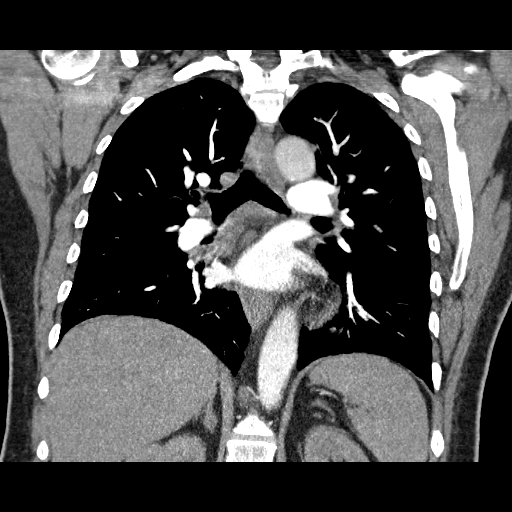

[18 of 36 positions shown; findings below may reference images not displayed]

FINDINGS: There is no evidence of pulmonary embolus.

Significant dependent atelectasis is noted bilaterally.  More focal
atelectasis is seen within the anterior aspect of the right middle
lobe.  There is no evidence of significant focal consolidation,
pleural effusion or pneumothorax.  No suspicious masses are
identified; no abnormal focal contrast enhancement is seen.

The mediastinum is unremarkable in appearance.  No mediastinal
lymphadenopathy is seen.  No pericardial effusion is identified.
The great vessels are unremarkable.  No axillary lymphadenopathy is
seen.  The thyroid gland is unremarkable in appearance.

The visualized portions of the liver and spleen are unremarkable.
There is mild reflux of contrast into the hepatic veins.  The
visualized portions of the pancreas, left adrenal gland and kidneys
are grossly unremarkable.  Note is made of a 1.7 x 1.2 cm mass
arising from the right adrenal gland.  Given that it appears
relatively homogeneous and well circumscribed, this is most likely
benign.  Per most recent recommendations, follow-up CT or MRI is
recommended in 12 months.

No acute osseous abnormalities are seen.

Review of the MIP images confirms the above findings.
IMPRESSION: 1.  No evidence of pulmonary embolus.
2.  Significant dependent atelectasis noted bilaterally.  Focal
atelectasis within the anterior aspect of the right middle lobe.
3.  1.7 cm mass arising from the right adrenal gland.  Given that
this appears relatively homogeneous and well-circumscribed, it is
most likely benign.  Per most recent recommendations, follow-up CT
or MRI is recommended in 12 months.

## 2010-12-16 ENCOUNTER — Emergency Department (HOSPITAL_COMMUNITY): Payer: PRIVATE HEALTH INSURANCE

## 2010-12-16 ENCOUNTER — Other Ambulatory Visit: Payer: Self-pay

## 2010-12-16 ENCOUNTER — Observation Stay (HOSPITAL_COMMUNITY)
Admission: EM | Admit: 2010-12-16 | Discharge: 2010-12-18 | Disposition: A | Discharge status: Home / Self Care | Payer: PRIVATE HEALTH INSURANCE | Attending: Family Medicine | Admitting: Family Medicine

## 2010-12-16 ENCOUNTER — Encounter (HOSPITAL_COMMUNITY): Payer: Self-pay | Admitting: *Deleted

## 2010-12-16 DIAGNOSIS — I2789 Other specified pulmonary heart diseases: Secondary | ICD-10-CM | POA: Insufficient documentation

## 2010-12-16 DIAGNOSIS — R079 Chest pain, unspecified: Secondary | ICD-10-CM

## 2010-12-16 DIAGNOSIS — R9431 Abnormal electrocardiogram [ECG] [EKG]: Secondary | ICD-10-CM | POA: Insufficient documentation

## 2010-12-16 DIAGNOSIS — M1990 Unspecified osteoarthritis, unspecified site: Secondary | ICD-10-CM | POA: Insufficient documentation

## 2010-12-16 DIAGNOSIS — R5383 Other fatigue: Secondary | ICD-10-CM | POA: Insufficient documentation

## 2010-12-16 DIAGNOSIS — R5381 Other malaise: Secondary | ICD-10-CM | POA: Insufficient documentation

## 2010-12-16 DIAGNOSIS — R0789 Other chest pain: Principal | ICD-10-CM | POA: Insufficient documentation

## 2010-12-16 DIAGNOSIS — F3289 Other specified depressive episodes: Secondary | ICD-10-CM | POA: Insufficient documentation

## 2010-12-16 DIAGNOSIS — F329 Major depressive disorder, single episode, unspecified: Secondary | ICD-10-CM | POA: Insufficient documentation

## 2010-12-16 DIAGNOSIS — M542 Cervicalgia: Secondary | ICD-10-CM | POA: Insufficient documentation

## 2010-12-16 DIAGNOSIS — F411 Generalized anxiety disorder: Secondary | ICD-10-CM | POA: Insufficient documentation

## 2010-12-16 DIAGNOSIS — G8929 Other chronic pain: Secondary | ICD-10-CM | POA: Insufficient documentation

## 2010-12-16 DIAGNOSIS — Z8249 Family history of ischemic heart disease and other diseases of the circulatory system: Secondary | ICD-10-CM | POA: Insufficient documentation

## 2010-12-16 DIAGNOSIS — R0602 Shortness of breath: Secondary | ICD-10-CM | POA: Insufficient documentation

## 2010-12-16 HISTORY — DX: Dorsalgia, unspecified: M54.9

## 2010-12-16 HISTORY — DX: Gastrointestinal hemorrhage, unspecified: K92.2

## 2010-12-16 HISTORY — DX: Other chronic pain: G89.29

## 2010-12-16 LAB — URINALYSIS, ROUTINE W REFLEX MICROSCOPIC
Glucose, UA: NEGATIVE mg/dL
Ketones, ur: NEGATIVE mg/dL
Leukocytes, UA: NEGATIVE
Protein, ur: NEGATIVE mg/dL
Urobilinogen, UA: 0.2 mg/dL (ref 0.0–1.0)

## 2010-12-16 LAB — CBC
MCH: 30.6 pg (ref 26.0–34.0)
MCH: 30.5 pg (ref 26.0–34.0)
MCHC: 34.5 g/dL (ref 30.0–36.0)
MCHC: 33.8 g/dL (ref 30.0–36.0)
MCV: 88.7 fL (ref 78.0–100.0)
Platelets: 294 10*3/uL (ref 150–400)
Platelets: 289 10*3/uL (ref 150–400)
RBC: 4.89 MIL/uL (ref 3.87–5.11)
RDW: 14.2 % (ref 11.5–15.5)

## 2010-12-16 LAB — CARDIAC PANEL(CRET KIN+CKTOT+MB+TROPI)
CK, MB: 1.8 ng/mL (ref 0.3–4.0)
CK, MB: 1.8 ng/mL (ref 0.3–4.0)
Total CK: 73 U/L (ref 7–177)
Troponin I: <0.3 ng/mL (ref <0.30)
Troponin I: <0.3 ng/mL (ref <0.30)

## 2010-12-16 LAB — BASIC METABOLIC PANEL
Calcium: 9.8 mg/dL (ref 8.4–10.5)
Creatinine, Ser: 0.71 mg/dL (ref 0.50–1.10)
GFR calc non Af Amer: >60 mL/min (ref >60)
Sodium: 138 mEq/L (ref 135–145)

## 2010-12-16 LAB — CREATININE, SERUM: Creatinine, Ser: 0.71 mg/dL (ref 0.50–1.10)

## 2010-12-16 MED ORDER — MORPHINE SULFATE CR 15 MG PO TB12
30.0000 mg | ORAL_TABLET | Freq: Two times a day (BID) | ORAL | Status: DC
  Administered 2010-12-17 (×3): 30 mg via ORAL
  Filled 2010-12-16 (×2): qty 2

## 2010-12-16 MED ORDER — ZOLPIDEM TARTRATE 5 MG PO TABS: 5.0000 mg | ORAL_TABLET | Freq: Every evening | ORAL | Status: DC | PRN

## 2010-12-16 MED ORDER — ENOXAPARIN SODIUM 80 MG/0.8ML ~~LOC~~ SOLN
40.0000 mg | SUBCUTANEOUS | Status: DC
  Administered 2010-12-16 – 2010-12-17 (×2): 40 mg via SUBCUTANEOUS
  Filled 2010-12-16 (×2): qty 0.8

## 2010-12-16 MED ORDER — PANTOPRAZOLE SODIUM 40 MG PO TBEC
40.0000 mg | DELAYED_RELEASE_TABLET | Freq: Every day | ORAL | Status: DC
  Administered 2010-12-16 – 2010-12-18 (×3): 40 mg via ORAL
  Filled 2010-12-16 (×3): qty 1

## 2010-12-16 MED ORDER — OXYCODONE HCL 5 MG PO TABS
30.0000 mg | ORAL_TABLET | Freq: Four times a day (QID) | ORAL | Status: DC
  Administered 2010-12-16 – 2010-12-18 (×6): 30 mg via ORAL
  Filled 2010-12-16 (×6): qty 6

## 2010-12-16 MED ORDER — POLYETHYLENE GLYCOL 3350 17 G PO PACK
17.0000 g | PACK | Freq: Every day | ORAL | Status: DC
  Filled 2010-12-16 (×2): qty 1

## 2010-12-16 MED ORDER — ACETAMINOPHEN 650 MG RE SUPP: 650.0000 mg | Freq: Four times a day (QID) | RECTAL | Status: DC | PRN

## 2010-12-16 MED ORDER — SODIUM CHLORIDE 0.9 % IV SOLN
INTRAVENOUS | Status: DC
  Administered 2010-12-17 – 2010-12-18 (×4): via INTRAVENOUS

## 2010-12-16 MED ORDER — CYCLOBENZAPRINE HCL 10 MG PO TABS
10.0000 mg | ORAL_TABLET | Freq: Three times a day (TID) | ORAL | Status: DC | PRN
  Administered 2010-12-17: 10 mg via ORAL

## 2010-12-16 MED ORDER — ONDANSETRON HCL 4 MG PO TABS: 4.0000 mg | ORAL_TABLET | Freq: Four times a day (QID) | ORAL | Status: DC | PRN

## 2010-12-16 MED ORDER — ASPIRIN 81 MG PO CHEW
324.0000 mg | CHEWABLE_TABLET | Freq: Once | ORAL | Status: AC
  Administered 2010-12-16: 324 mg via ORAL
  Filled 2010-12-16: qty 4

## 2010-12-16 MED ORDER — LORAZEPAM 1 MG PO TABS
1.0000 mg | ORAL_TABLET | Freq: Once | ORAL | Status: AC
  Administered 2010-12-16: 1 mg via ORAL
  Filled 2010-12-16: qty 1

## 2010-12-16 MED ORDER — ACETAMINOPHEN 325 MG PO TABS: 650.0000 mg | ORAL_TABLET | Freq: Four times a day (QID) | ORAL | Status: DC | PRN

## 2010-12-16 MED ORDER — ALPRAZOLAM 0.5 MG PO TABS
1.0000 mg | ORAL_TABLET | Freq: Two times a day (BID) | ORAL | Status: DC
  Administered 2010-12-16 – 2010-12-18 (×4): 1 mg via ORAL
  Filled 2010-12-16: qty 2
  Filled 2010-12-16 (×2): qty 1
  Filled 2010-12-16 (×2): qty 2

## 2010-12-16 MED ORDER — ONDANSETRON HCL 4 MG/2ML IJ SOLN: 4.0000 mg | Freq: Four times a day (QID) | INTRAMUSCULAR | Status: DC | PRN

## 2010-12-16 NOTE — Progress Notes (Signed)
Pt sitting on side of the bed waiting to go to Xray. No distress. Pt states she is less "shakey". Chest pain is only slight. She feels safer now. Test results up to this point discussed with pt. Cxray pending. 2nd cardiac markers pending.

## 2010-12-16 NOTE — ED Provider Notes (Signed)
History     CSN: [id number] Arrival date & time: 12/16/2010  3:17 PM  Chief Complaint  Patient presents with  . Chest Pain   HPI Comments: Pt reports daily "shaking episodes", usually accompanied by SOB, chest pain, and sweats. This has been intermittent since NOV. 2012. It has become worse in the past few days, accompanies by weakness and easy fatigue. No n/v/d or LOC.  Patient is a 50 y.o. female presenting with chest pain. The history is provided by the patient.  Chest Pain The chest pain began more  than 1 month ago. Duration of episode(s) is 2 minutes. Chest pain occurs frequently. The chest pain is worsening. The severity of the pain is moderate. The quality of the pain is described as aching, sharp and tightness. The pain does not radiate. Primary symptoms include fatigue and shortness of breath. Pertinent negatives for primary symptoms include no fever, no syncope, no cough, no wheezing, no palpitations, no abdominal pain, no nausea, no vomiting, no dizziness and no altered mental status.  Pertinent negatives for associated symptoms include no near-syncope. She tried nothing for the symptoms. Risk factors include smoking/tobacco exposure.  Her past medical history is significant for anxiety/panic attacks and arrhythmia.  Pertinent negatives for past medical history include no aneurysm, no MI, no pacemaker, no PE, no recent injury, no seizures and no strokes.  Her family medical history is significant for hypertension in family.     Past Medical History  Diagnosis Date  . Chest pain   . Pulmonary HTN   . Allergic rhinitis   . Anxiety and depression   . Osteoarthritis   . PUD (peptic ulcer disease)   . Cervical cancer   . Neck pain   . Gout   . DJD (degenerative joint disease) of lumbar spine     Past Surgical History  Procedure Date  . Total abdominal hysterectomy   . Btl   . Cervival cone   . Tubal ligation     Family History  Problem Relation Age of Onset  .  Coronary artery disease      History  Substance Use Topics  . Smoking status: Current Everyday Smoker  . Smokeless tobacco: Not on file  . Alcohol Use: No    OB History    Grav Para Term Preterm Abortions TAB SAB Ect Mult Living                  Review of Systems  Constitutional: Positive for fatigue. Negative for fever and activity change.       All ROS Neg except as noted in HPI  HENT: Negative for nosebleeds and neck pain.   Eyes: Negative for photophobia and discharge.  Respiratory: Positive for shortness of breath. Negative for cough and wheezing.   Cardiovascular: Positive for chest pain. Negative for palpitations, syncope and near-syncope.  Gastrointestinal: Negative for nausea, vomiting, abdominal pain and blood in stool.  Genitourinary: Negative for dysuria, frequency and hematuria.  Musculoskeletal: Negative for back pain and arthralgias.  Skin: Negative.   Neurological: Negative for dizziness, seizures and speech difficulty.  Psychiatric/Behavioral: Negative for hallucinations, confusion and altered mental status. The patient is nervous/anxious.     Physical Exam  BP 123/82  Temp(Src) 98.1 F (36.7 C) (Oral)  Resp 20  Ht 5\' 4"  (1.626 m)  Wt 165 lb (74.844 kg)  BMI 28.32 kg/m2  SpO2 96%  LMP 09/16/2000  Physical Exam  Nursing note and vitals reviewed. Constitutional: She is oriented to person,  place, and time. She appears well-developed and well-nourished.  Non-toxic appearance.  HENT:  Head: Normocephalic.  Right Ear: Tympanic membrane and external ear normal.  Left Ear: Tympanic membrane and external ear normal.  Eyes: EOM and lids are normal. Pupils are equal, round, and reactive to light.  Neck: Normal range of motion. Neck supple. Carotid bruit is not present.  Cardiovascular: Normal rate, regular rhythm, normal heart sounds, intact distal pulses and normal pulses.  Exam reveals no gallop and no friction rub.   No murmur heard. Pulmonary/Chest:  Breath sounds normal. No respiratory distress. She has no wheezes. She exhibits no tenderness.  Abdominal: Soft. Bowel sounds are normal. There is no tenderness. There is no guarding.  Musculoskeletal: Normal range of motion.       Neg Homan's Sign.  Lymphadenopathy:       Head (right side): No submandibular adenopathy present.       Head (left side): No submandibular adenopathy present.    She has no cervical adenopathy.  Neurological: She is alert and oriented to person, place, and time. She has normal strength. No cranial nerve deficit or sensory deficit.  Skin: Skin is warm and dry.  Psychiatric: Her speech is normal. Her mood appears anxious.    ED Course  Procedures  OTHER DATA REVIEWED: Nursing notes, vital signs, and past medical records reviewed.  DIAGNOSTIC STUDIES: Oxygen Saturation is 96% on room air, normal by my interpretation.    LABS / RADIOLOGY:  Results for orders placed during the hospital encounter of 12/16/10  CARDIAC PANEL(CRET KIN+CKTOT+MB+TROPI)      Component Value Range   Total CK 73  7 - 177 (U/L)   CK, MB 1.8  0.3 - 4.0 (ng/mL)   Troponin I <0.30  <0.30 (ng/mL)   Relative Index RELATIVE INDEX IS INVALID  0.0 - 2.5   CBC      Component Value Range   WBC 8.2  4.0 - 10.5 (K/uL)   RBC 5.03  3.87 - 5.11 (MIL/uL)   Hemoglobin 15.4 (*) 12.0 - 15.0 (g/dL)   HCT 04.5  40.9 - 81.1 (%)   MCV 88.7  78.0 - 100.0 (fL)   MCH 30.6  26.0 - 34.0 (pg)   MCHC 34.5  30.0 - 36.0 (g/dL)   RDW 91.4  78.2 - 95.6 (%)   Platelets 294  150 - 400 (K/uL)  BASIC METABOLIC PANEL      Component Value Range   Sodium 138  135 - 145 (mEq/L)   Potassium 3.7  3.5 - 5.1 (mEq/L)   Chloride 102  96 - 112 (mEq/L)   CO2 27  19 - 32 (mEq/L)   Glucose, Bld 117 (*) 70 - 99 (mg/dL)   BUN 11  6 - 23 (mg/dL)   Creatinine, Ser 2.13  0.50 - 1.10 (mg/dL)   Calcium 9.8  8.4 - 08.6 (mg/dL)   GFR calc non Af Amer >60  >60 (mL/min)   GFR calc Af Amer >60  >60 (mL/min)  URINALYSIS, ROUTINE W  REFLEX MICROSCOPIC      Component Value Range   Color, Urine YELLOW  YELLOW    Appearance CLEAR  CLEAR    Specific Gravity, Urine 1.020  1.005 - 1.030    pH 6.5  5.0 - 8.0    Glucose, UA NEGATIVE  NEGATIVE (mg/dL)   Hgb urine dipstick NEGATIVE  NEGATIVE    Bilirubin Urine NEGATIVE  NEGATIVE    Ketones, ur NEGATIVE  NEGATIVE (mg/dL)  Protein, ur NEGATIVE  NEGATIVE (mg/dL)   Urobilinogen, UA 0.2  0.0 - 1.0 (mg/dL)   Nitrite NEGATIVE  NEGATIVE    Leukocytes, UA NEGATIVE  NEGATIVE   CARDIAC PANEL(CRET KIN+CKTOT+MB+TROPI)      Component Value Range   Total CK 65  7 - 177 (U/L)   CK, MB 1.8  0.3 - 4.0 (ng/mL)   Troponin I <0.30  <0.30 (ng/mL)   Relative Index RELATIVE INDEX IS INVALID  0.0 - 2.5    Dg Chest 2 View  12/16/2010  *RADIOLOGY REPORT*  Clinical Data: Chest pain and tightness  CHEST - 2 VIEW  Comparison: 09/17/2010  Findings: Negative for heart failure or pneumonia.  Lungs are clear.  Negative for mass or effusion.  IMPRESSION: No active cardiopulmonary disease.  Original Report Authenticated By: Camelia Phenes, M.D.      ED COURSE / COORDINATION OF CARE: Case discussed with Dr Juanetta Gosling at [phone]. He will admit pt to [facility] unit. Patient feeling better after ativan given.  MDM:  I have reviewed nursing notes, vital signs, and all appropriate lab and imaging results for this patient. R/O dysrhythmia, anxiety/depression, electrolyte imbalance.   IMPRESSION: Chest Pain Anxiety  PLAN:  Admitted to step down unit.  the case was discussed with the admitting physician Dr Juanetta Gosling.      Medications  oxymorphone (OPANA ER) 20 MG 12 hr tablet (not administered)  oxycodone (ROXICODONE) 30 MG immediate release tablet (not administered)  pantoprazole (PROTONIX) 40 MG tablet (not administered)  ALPRAZolam (XANAX) 1 MG tablet (not administered)  cyclobenzaprine (FLEXERIL) 10 MG tablet (not administered)  PRESCRIPTION MEDICATION (not administered)  aspirin chewable tablet 324  mg (324 mg Oral Given 12/16/10 1916)  LORazepam (ATIVAN) tablet 1 mg (1 mg Oral Given 12/16/10 1916)       Kathie Dike, PA 12/16/10 1557  Kathie Dike, [place] 12/16/10 1958
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
## 2010-12-16 NOTE — Progress Notes (Signed)
Evaluation and management procedures were performed by the PA/NP under my supervision/collaboration.   

## 2010-12-16 NOTE — ED Notes (Signed)
C/o "shaking" for a while.  Chest pain, sob, and diaphoresis started approx 0800 today.  Denies n/v/weakness/dizziness.

## 2010-12-16 NOTE — ED Provider Notes (Signed)
Evaluation and management procedures were performed by the PA/NP under my supervision/collaboration.    Loyola Santino D Micalah Cabezas, MD 12/16/10 2257 

## 2010-12-17 ENCOUNTER — Other Ambulatory Visit: Payer: Self-pay

## 2010-12-17 ENCOUNTER — Encounter (HOSPITAL_COMMUNITY): Payer: Self-pay | Admitting: Cardiology

## 2010-12-17 DIAGNOSIS — R0789 Other chest pain: Secondary | ICD-10-CM

## 2010-12-17 DIAGNOSIS — R072 Precordial pain: Secondary | ICD-10-CM

## 2010-12-17 LAB — BASIC METABOLIC PANEL
CO2: 30 mEq/L (ref 19–32)
Calcium: 9.2 mg/dL (ref 8.4–10.5)
Chloride: 100 mEq/L (ref 96–112)
GFR calc Af Amer: >60 mL/min (ref >60)
Sodium: 136 mEq/L (ref 135–145)

## 2010-12-17 LAB — MRSA PCR SCREENING: MRSA by PCR: NEGATIVE

## 2010-12-17 MED ORDER — REGADENOSON 0.4 MG/5ML IV SOLN
0.4000 mg | Freq: Once | INTRAVENOUS | Status: DC
  Administered 2010-12-18: 0.4 mg via INTRAVENOUS
  Filled 2010-12-17: qty 5

## 2010-12-17 NOTE — Progress Notes (Signed)
*  PRELIMINARY RESULTS* Echocardiogram 2D Echocardiogram has been performed.  [name] [facility] 12/17/2010, 12:07 PM

## 2010-12-17 NOTE — Progress Notes (Unsigned)
[name], [name]              ACCOUNT NO.:  [id number]  MEDICAL RECORD NO.:  [id number]  LOCATION:  IC11                          FACILITY:  [facility]  PHYSICIAN:  Devory Mckinzie G. Renard Matter, MD   DATE OF BIRTH:  15-Jan-1961  DATE OF PROCEDURE: DATE OF DISCHARGE:                                PROGRESS NOTE   This patient was admitted with chest pain and shortness of breath which had become worse over the last few days.  It was followed by ED that the patient should be admitted to rule out MI and for further cardiac evaluation.  She has had essentially no pain this morning and the patient feels that she has been in much anxiety and has been depressed.  OBJECTIVE:  VITAL SIGNS:  Blood pressure 89/44, respirations 12, pulse 54, temp 97.5. LUNGS:  Occasional rhonchus heard. HEART:  Regular rhythm. ABDOMEN:  No palpable organs or masses.  ASSESSMENT:  The patient admitted to the hospital with chest pain to rule out myocardial infarction.  She will have serial cardiac enzymes, EKG, and echocardiogram and we will get cardiology consult.  So far cardiac markers have been within normal range.     Deante Blough G. Renard Matter, MD     AGM/MEDQ  D:  12/17/2010  T:  12/17/2010  Job:  [id number]

## 2010-12-17 NOTE — Progress Notes (Signed)
UR Chart Review Completed  

## 2010-12-17 NOTE — Progress Notes (Signed)
Pt to be transferred to regular floor with tele per MD order. Report given to RN. Pt transferred via wheelchair.

## 2010-12-17 NOTE — Consult Note (Signed)
Clinical Summary [name] is a 50 y.o.female now admitted to the hospital with chest discomfort. She reports being under a substantial amount of stress and suffering from anxiety. She reports a chronic history of dyspnea on exertion, and intermittent episodes of chest heaviness, but she attributes to "anxiety attacks." She reports a particularly intense episode within the last few days, associated with weakness and fatigue. She was admitted for further observation, and has ruled out for myocardial infarction with normal troponin levels. ECG is however no abnormal, showing anterolateral T wave inversions, somewhat dynamic. Followup echocardiogram demonstrates an LVEF of 50-55% with possible hypokinesis of the basal inferior segment.  She has no clearly documented history of obstructive CAD or myocardial infarction. Ischemic testing in November of last year was overall low risk, demonstrating breast attenuation without clear evidence of scar or ischemia. LVEF was 57%. We are consulted to assist with her management.   Allergies  Allergen Reactions  . Vicodin (Hydrocodone-Acetaminophen) Itching    Medication list reviewed.  Past Medical History  Diagnosis Date  . Allergic rhinitis   . Anxiety and depression   . Osteoarthritis   . PUD (peptic ulcer disease)   . Cervical cancer   . Gout   . DJD (degenerative joint disease) of lumbar spine   . Chronic back pain   . GI bleed     Past Surgical History  Procedure Date  . Total abdominal hysterectomy   . Cervival cone   . Tubal ligation   . Appendectomy     Family History  Problem Relation Age of Onset  . Coronary artery disease Father   . Hypertension Father   . Diabetes type II Mother   . Alcohol abuse Mother     Social History Ms. Mccolgan reports that she has been smoking Cigarettes.  She has never used smokeless tobacco. Ms. Marion reports that she does not drink alcohol.  Review of Systems No palpitations or syncope. Has  trouble with chronic back pain. Reports no cough or hemoptysis. No melena or hematochezia. Describes feeling "shaky" and uneasy. Otherwise as outlined above.  Physical Examination Blood pressure 121/67, pulse 65, temperature 97.3 F (36.3 C), temperature source Axillary, resp. rate 15, height 5\' 4"  (1.626 m), weight 170 lb 10.2 oz (77.4 kg), last menstrual period 09/16/2000, SpO2 97.00%.  Somewhat disheveled-appearing woman in no acute distress, tearful. HEENT: Conjunctivae and lids normal, oropharynx with poor dentition. Neck: Supple, no elevated JVP or carotid bruits, without megaly. Lungs: Coarse breath sounds with scattered rhonchi, no wheezing. Cardiac: Regular rate and rhythm, no S3 gallop or significant murmur. Abdomen: Soft, nontender, bowel sounds present. Skin: Warm and dry. Musculoskeletal: No kyphosis. Extremities: No pitting edema, distal pulses with 2+. Neuropsychiatric: Alert and oriented x3, anxious.   ECG Serial tracings from August 22 and August 23 shows sinus rhythm with anterolateral T wave inversions, occasional ventricular ectopy.  Studies Echocardiogram 12/17/2010: - Left ventricle: The cavity size was normal. Wall thickness was normal. Systolic function was normal. The estimated ejection fraction was in the range of 50% to 55%. Possible hypokinesis of the basal inferior myocardium. Left ventricular diastolic function parameters were normal. - Mitral valve: Trivial regurgitation. - Left atrium: The atrium was at the upper limits of normal in size. - Tricuspid valve: Trivial regurgitation. - Pericardium, extracardiac: There was no pericardial effusion.  Chest x-ray 12/16/2010: No active cardiopulmonary disease.  Lab Results  Component Value Date   CREATININE 0.83 12/17/2010   BUN 9 12/17/2010   NA 136  12/17/2010   K 3.5 12/17/2010   CL 100 12/17/2010   CO2 30 12/17/2010   Lab Results  Component Value Date   WBC 7.8 12/16/2010   HGB 14.9 12/16/2010   HCT  44.1 12/16/2010   MCV 90.2 12/16/2010   PLT 289 12/16/2010   Lab Results  Component Value Date   CKTOTAL 52 12/17/2010   CKMB 1.8 12/17/2010   TROPONINI <0.30 12/17/2010    Impression  1. Chest pain - typical and atypical features. Cardiac markers at this point are normal, however ECG is abnormal with anterolateral T wave inversions that are somewhat dynamic. Echocardiogram also suggests possible inferobasal wall motion abnormality with LVEF of 50-55%. Cardiac risk factors include a long-standing history of tobacco use, hyperlipidemia based on previous lab work, some family history of CAD.  2. Hyperlipidemia - LDL 158 in November 2011.  3. Tobacco abuse  4. History of anxiety and depression - seems to be a significant limitation at this point.  Plan  Although her symptoms may be multifactorial in light of comorbid illnesses, the possibility of underlying CAD is certainly to be considered. At this point cardiac markers argue against an acute coronary syndrome. I discussed the case with Dr. Renard Matter, and plan is to arrange a followup Lexiscan Myoview for tomorrow as an inpatient, to better understand the patient's risk, and whether she can be treated medically, or need to be considered for further invasive testing. Would also agree with further efforts to manage her anxiety and depression, as it seems to be a major limitation for her. Smoking cessation is recommended, as is more aggressive management of her lipids. Further recommendations to follow.

## 2010-12-18 ENCOUNTER — Inpatient Hospital Stay (HOSPITAL_COMMUNITY): Payer: PRIVATE HEALTH INSURANCE

## 2010-12-18 ENCOUNTER — Encounter (HOSPITAL_COMMUNITY): Payer: Self-pay | Admitting: Cardiology

## 2010-12-18 ENCOUNTER — Observation Stay (HOSPITAL_COMMUNITY): Payer: PRIVATE HEALTH INSURANCE

## 2010-12-18 MED ORDER — TECHNETIUM TC 99M TETROFOSMIN IV KIT
30.0000 | PACK | Freq: Once | INTRAVENOUS | Status: AC | PRN
  Administered 2010-12-18: 28.5 via INTRAVENOUS

## 2010-12-18 MED ORDER — REGADENOSON 0.4 MG/5ML IV SOLN
INTRAVENOUS | Status: AC
  Administered 2010-12-18: 0.4 mg via INTRAVENOUS
  Filled 2010-12-18: qty 5

## 2010-12-18 MED ORDER — NAPHAZOLINE-GLYCERIN 0.012-0.2 % OP SOLN
1.0000 [drp] | OPHTHALMIC | Status: DC | PRN
  Filled 2010-12-18: qty 15

## 2010-12-18 MED ORDER — SODIUM CHLORIDE 0.9 % IJ SOLN
INTRAMUSCULAR | Status: AC
  Administered 2010-12-18: 10 mL via INTRAVENOUS
  Filled 2010-12-18: qty 10

## 2010-12-18 MED ORDER — REGADENOSON 0.4 MG/5ML IV SOLN: 0.4000 mg | Freq: Once | INTRAVENOUS | Status: DC

## 2010-12-18 MED ORDER — POLYVINYL ALCOHOL 1.4 % OP SOLN: 1.0000 [drp] | OPHTHALMIC | Status: DC | PRN

## 2010-12-18 MED ORDER — TECHNETIUM TC 99M TETROFOSMIN IV KIT
10.0000 | PACK | Freq: Once | INTRAVENOUS | Status: AC | PRN
  Administered 2010-12-18: 10.5 via INTRAVENOUS

## 2010-12-18 NOTE — Discharge Summary (Signed)
[name], [name]              ACCOUNT NO.:  [id number]  MEDICAL RECORD NO.:  [id number]  LOCATION:  A303                          FACILITY:  [facility]  PHYSICIAN:  Kinsie Belford G. Renard Matter, MD   DATE OF BIRTH:  [birth year]  DATE OF ADMISSION:  12/16/2010 DATE OF DISCHARGE:  LH                              DISCHARGE SUMMARY   DIAGNOSES: 1. Atypical chest pain. 2. Possible underlying ischemic heart disease. 3. Pulmonary hypertension. 4. Chronic anxiety and depression. 5. Osteoarthritis. 6. Degenerative joint disease of lumbar spine. 7. Chronic neck pain.  CONDITION:  Stable at the time of her discharge.  HISTORY:  This 50 year old white female came to emergency room with complaints of chest pain, shortness of breath and sweats, which this had become worse for several days prior to her being seen in the ED.  It was felt by ED physician that she should be brought into the hospital to rule out myocardial infarction and for further cardiac evaluation.  She had also been sent to mental health, but had difficulty affording care there.  She had anxiety episodes prior to admission.  PHYSICAL EXAMINATION:  VITAL SIGNS:  Blood pressure on admission 111/73, pulse 63, temp 98. HEENT:  Eyes:  PERRLA.  TM negative.  Oropharynx benign. NECK:  Supple.  No JVD or thyroid abnormalities. LUNGS:  Diminished breath sounds and occasional rhonchus bilaterally. HEART:  Regular rhythm.  No murmurs.  No cardiomegaly. ABDOMEN:  No palpable organs or masses. EXTREMITIES:  Free of edema.  LABORATORY DATA ON ADMISSION:  CBC:  WBC 8200 with hemoglobin 15.4, hematocrit 44.6.  Chemistries on admission:  Sodium 138, potassium 3.7, chloride 102, CO2 27, glucose 117, BUN 11, creatinine 0.71, calcium 98.1.  Urinalysis negative.  Cardiac panel on admission:  CK 60, CK-MB 1.8, troponin less than 0.30.  Subsequent cardiac marker:  CK 52, CK-MB 1.8, troponin less than 0.30.  X-ray:  Chest x-ray on admission, no active  cardiopulmonary disease.  HOSPITAL COURSE:  The patient on admission was continued on following medications: 1. Alprazolam 1 mg b.i.d. 2. Enoxaparin 40 mg subcu q.24 h. 3. Morphine 30 mg q.12 h. 4. Oxycodone 30 mg q.i.d. 5. Pantoprazole 40 mg daily. 6. Polyethylene glycol 17 g p.o. daily. 7. She was given Lexiscan injection 0.4 mg IV. 8. Upon one occasion, she was continued on IV fluids, half normal     saline.  The patient remained fairly comfortable during her hospital stay in ICU. She was seen in consultation by [facility] and a stress test was ordered.  She did have slight abnormality, T-wave changes on electrocardiogram and slightly abnormal echocardiogram.  It was felt that if stress test which will be done today is normal, then the patient can be discharged home.  Echocardiogram did demonstrate LVEF of 50-55% with possible hypokinesis of basal inferior segment.     [name] G. Renard Matter, MD     AGM/MEDQ  D:  12/18/2010  T:  12/18/2010  Job:  [id number]

## 2010-12-18 NOTE — Progress Notes (Signed)
SUBJECTIVE:No further complaints of chest pain.  "Its anxiety."    Filed Vitals:   12/17/10 1654 12/17/10 1900 12/18/10 0447 12/18/10 0648  BP: 126/75 134/71 128/72   Pulse: 61 63 61   Temp: 97.6 F (36.4 C) 97.4 F (36.3 C) 97.9 F (36.6 C)   TempSrc: Oral Oral    Resp: 16 16 16    Height:      Weight:    176 lb 9.4 oz (80.1 kg)  SpO2: 98% 100% 96%     Intake/Output Summary (Last 24 hours) at 12/18/10 1026 Last data filed at 12/18/10 1610  Gross per 24 hour  Intake   4151 ml  Output      0 ml  Net   4151 ml    LABS: Basic Metabolic Panel:  Basename 12/17/10 0220 12/16/10 2103 12/16/10 1543  NA 136 -- 138  K 3.5 -- 3.7  CL 100 -- 102  CO2 30 -- 27  GLUCOSE 105* -- 117*  BUN 9 -- 11  CREATININE 0.83 0.71 --  CALCIUM 9.2 -- 9.8  MG -- -- --  PHOS -- -- --   CBC:  Basename 12/16/10 2103 12/16/10 1543  WBC 7.8 8.2  NEUTROABS -- --  HGB 14.9 15.4*  HCT 44.1 44.6  MCV 90.2 88.7  PLT 289 294   Cardiac Enzymes:  Basename 12/17/10 0200 12/16/10 1800 12/16/10 1543  CKTOTAL 52 65 73  CKMB 1.8 1.8 1.8  CKMBINDEX -- -- --  TROPONINI <0.30 <0.30 <0.30     RADIOLOGY: Dg Chest 2 View  12/16/2010  *RADIOLOGY REPORT*  Clinical Data: Chest pain and tightness  CHEST - 2 VIEW  Comparison: 09/17/2010  Findings: Negative for heart failure or pneumonia.  Lungs are clear.  Negative for mass or effusion.  IMPRESSION: No active cardiopulmonary disease.  Original Report Authenticated By: Camelia Phenes, M.D.    PHYSICAL EXAM General: Well developed, well nourished, in no acute distress Head: Eyes PERRLA, No xanthomas.   Normal cephalic and atramatic  Lungs: Clear bilaterally to auscultation and percussion. Heart: HRRR S1 S2, with soft S4 murmur.  Pulses are 2+ & equal.            No carotid bruit. No JVD.  No abdominal bruits. No femoral bruits. Abdomen: Bowel sounds are positive, abdomen soft and non-tender without masses or                  Hernia's noted. Msk:  Back  normal, normal gait. Normal strength and tone for age. Extremities: No clubbing, cyanosis or edema.  DP +1 Neuro: Alert and oriented X 3. Psych:  Good affect, responds appropriately  TELEMETRY: Reviewed telemetry pt in NSR  ASSESSMENT AND PLAN:  1. Chest Pain:  Negative cardiac enzymes.  She has experienced no further chest pain or discomfort. She is stating that this is anxiety. It is atypical in etiology.  She is undergoing stress myoview for evaluation of ischemia. More recommendations per Dr. Diona Browner when test results are evaluated. 2.  Ongoing tobacco abuse: States this helps with anxiety. She is counseled on cessation.  3.  Anxiety:  She is advised to seek assistance with this via her PCP or on her own.  ADDENDUM: Patient unable to complete walking portion of stress myoview, stepping off treadmill while still in motion at Stage III .  States she could not walk any further. Lexiscan used instead. Hypertensive response to exercise with evidence of LV strain.  Nuclear scintigraphy to follow.  Active Problems:  * No active hospital problems. [name]. Lyman Bishop NP

## 2010-12-18 NOTE — Discharge Summary (Signed)
[name], [name]              ACCOUNT NO.:  [id number]  MEDICAL RECORD NO.:  [id number]  LOCATION:  A303                          FACILITY:  [facility]  PHYSICIAN:  Mariann Palo G. Renard Matter, MD   DATE OF BIRTH:  1961/01/09  DATE OF ADMISSION:  12/16/2010 DATE OF DISCHARGE:  LH                              DISCHARGE SUMMARY   ADDENDUM: The patient's medication list at discharge: 1. Alprazolam 1 mg every 4 hours p.r.n. 2. Flexeril 10 mg q.i.d. 3. Oxycodone 30 mg. 4. Opana ER 20 mg every 12 hours. 5. Protonix 40 mg daily.     [name] G. Renard Matter, MD     AGM/MEDQ  D:  12/18/2010  T:  12/18/2010  Job:  [id number]

## 2010-12-18 NOTE — Progress Notes (Signed)
Stress Lab Nurses Notes - [name]  [name] 12/18/2010  Reason for doing test: Chest Pain  Type of test: Stress Myoview and Test Changed unable to reach THR stepped off TM.  [name] given  Nurse performing test: Parke Poisson, RN  Nuclear Medicine Tech: Dillard Essex  Echo Tech: Not Applicable  MD performing test: Ival Bible. MD  & [name]. NP  Family MD: McInnis  Test explained and consent signed: yes  IV started: 20g jelco, IV in progress from floor and No redness or edema  Symptoms: Mild SOB  Treatment/Intervention: None  Reason test stopped: protocol completed  After recovery IV was: Left intact (rate of 125cc/min)  Patient to return to Nuc. Med at :11:10am  Patient discharged: Transported back to room 303 via Wheelchair  Patient's Condition upon discharge was: stable  Comments: Symptoms resolved in recovery. Peak BP 160/68 & HR 138.  BP during recovery 128/72, HR 85.   [name] T

## 2010-12-18 NOTE — Progress Notes (Signed)
Patient seen and examined. Denies further chest pain, very eager to go home today. Troponin levels have been normal. She is completing a [employer] today for further evaluation. Anticipate medical therapy and observation, unless high risk features are noted.

## 2010-12-19 NOTE — Progress Notes (Signed)
Patient received discharge instructions along with follow up appointments. Patient verbalized understanding of all instructions. Patient was escorted by staff via wheelchair to vehicle. Patient discharged to home in stable condition. 

## 2011-01-07 ENCOUNTER — Ambulatory Visit (HOSPITAL_COMMUNITY): Payer: PRIVATE HEALTH INSURANCE | Admitting: Psychology

## 2011-02-05 LAB — POCT HEMOGLOBIN-HEMACUE
Hemoglobin: 13.7
Operator id: 141661

## 2011-02-08 ENCOUNTER — Other Ambulatory Visit (HOSPITAL_COMMUNITY): Payer: Self-pay | Admitting: Family Medicine

## 2011-02-08 DIAGNOSIS — M79606 Pain in leg, unspecified: Secondary | ICD-10-CM

## 2011-02-08 DIAGNOSIS — R609 Edema, unspecified: Secondary | ICD-10-CM

## 2011-02-09 ENCOUNTER — Other Ambulatory Visit (HOSPITAL_COMMUNITY): Payer: Self-pay | Admitting: Family Medicine

## 2011-02-09 ENCOUNTER — Ambulatory Visit (HOSPITAL_COMMUNITY)
Admission: RE | Admit: 2011-02-09 | Discharge: 2011-02-09 | Disposition: A | Discharge status: Home / Self Care | Payer: PRIVATE HEALTH INSURANCE | Source: Ambulatory Visit | Attending: Family Medicine | Admitting: Family Medicine

## 2011-02-09 DIAGNOSIS — M79606 Pain in leg, unspecified: Secondary | ICD-10-CM

## 2011-02-09 DIAGNOSIS — M79609 Pain in unspecified limb: Secondary | ICD-10-CM | POA: Insufficient documentation

## 2011-02-09 DIAGNOSIS — R52 Pain, unspecified: Secondary | ICD-10-CM

## 2011-02-09 DIAGNOSIS — R609 Edema, unspecified: Secondary | ICD-10-CM

## 2011-02-09 DIAGNOSIS — M7989 Other specified soft tissue disorders: Secondary | ICD-10-CM | POA: Insufficient documentation

## 2011-07-22 ENCOUNTER — Ambulatory Visit (INDEPENDENT_AMBULATORY_CARE_PROVIDER_SITE_OTHER): Payer: PRIVATE HEALTH INSURANCE | Admitting: Otolaryngology

## 2011-07-22 DIAGNOSIS — H9209 Otalgia, unspecified ear: Secondary | ICD-10-CM

## 2011-07-22 DIAGNOSIS — J381 Polyp of vocal cord and larynx: Secondary | ICD-10-CM

## 2011-07-22 DIAGNOSIS — R490 Dysphonia: Secondary | ICD-10-CM

## 2012-02-02 ENCOUNTER — Ambulatory Visit: Payer: PRIVATE HEALTH INSURANCE | Attending: Neurology | Admitting: Sleep Medicine

## 2012-02-02 DIAGNOSIS — G4700 Insomnia, unspecified: Secondary | ICD-10-CM

## 2012-02-02 DIAGNOSIS — G4710 Hypersomnia, unspecified: Secondary | ICD-10-CM | POA: Insufficient documentation

## 2012-02-08 NOTE — Procedures (Signed)
[facility] Tena Linebaugh A. Gerilyn Pilgrim, MD     [email or web address]         NAMEDARIEL, TEIXEIRA              ACCOUNT NO.:  [id number]  MEDICAL RECORD NO.:  [id number]          PATIENT TYPE:  OUT  LOCATION:  SLEEP LAB                     FACILITY:  [facility]  PHYSICIAN:  Cherisse Carrell A. Gerilyn Pilgrim, M.D. DATE OF BIRTH:  Sep 02, 1960  DATE OF STUDY:  02/02/2012                           NOCTURNAL POLYSOMNOGRAM  REFERRING PHYSICIAN:  Angus G. McInnis, MD  INDICATION:  This is a 51 year old, who presents with fatigue, snoring, and awakening with dyspnea.  MEDICATIONS:  Oxycodone, Flexeril, Zantac, Cymbalta.  EPWORTH SLEEPINESS SCALE:  4.  BMI:  29.  ARCHITECTURAL SUMMARY:  This is a full-night recording.  The total recording time is 362 minutes.  Sleep efficiency 65%, sleep latency 12 minutes, REM latency 158 minutes.  Stage N1 10%, N2 73%, N3 0%, and REM sleep 60%.  RESPIRATORY SUMMARY:  Baseline oxygen saturation is 98, lowest saturation 87 during REM sleep.  Diagnostic AHI is 4.  LIMB MOVEMENT SUMMARY:  PLM index 0.  ELECTROCARDIOGRAM SUMMARY:  Average heart rate is 66 with no significant dysrhythmias observed.  IMPRESSION:  Unremarkable nocturnal polysomnography.    Brylea Pita A. Gerilyn Pilgrim, M.D.    KAD/MEDQ  D:  02/08/2012 09:13:20  T:  02/08/2012 62:13:08  Job:  [id number]

## 2012-05-18 ENCOUNTER — Ambulatory Visit (INDEPENDENT_AMBULATORY_CARE_PROVIDER_SITE_OTHER): Payer: PRIVATE HEALTH INSURANCE | Admitting: Otolaryngology

## 2012-06-01 ENCOUNTER — Encounter (HOSPITAL_COMMUNITY): Payer: Self-pay | Admitting: Emergency Medicine

## 2012-06-01 ENCOUNTER — Emergency Department (HOSPITAL_COMMUNITY)
Admission: EM | Admit: 2012-06-01 | Discharge: 2012-06-02 | Discharge status: Against Medical Advice | Payer: Medicaid Other | Attending: Family Medicine | Admitting: Family Medicine

## 2012-06-01 ENCOUNTER — Emergency Department (HOSPITAL_COMMUNITY): Payer: Medicaid Other

## 2012-06-01 DIAGNOSIS — R0789 Other chest pain: Secondary | ICD-10-CM | POA: Insufficient documentation

## 2012-06-01 DIAGNOSIS — R1013 Epigastric pain: Secondary | ICD-10-CM | POA: Insufficient documentation

## 2012-06-01 DIAGNOSIS — R079 Chest pain, unspecified: Secondary | ICD-10-CM

## 2012-06-01 DIAGNOSIS — K3189 Other diseases of stomach and duodenum: Secondary | ICD-10-CM | POA: Insufficient documentation

## 2012-06-01 LAB — COMPREHENSIVE METABOLIC PANEL
BUN: 6 mg/dL (ref 6–23)
Calcium: 9 mg/dL (ref 8.4–10.5)
Creatinine, Ser: 0.91 mg/dL (ref 0.50–1.10)
GFR calc Af Amer: 82 mL/min — ABNORMAL LOW (ref >90)
Glucose, Bld: 93 mg/dL (ref 70–99)
Total Protein: 6.7 g/dL (ref 6.0–8.3)

## 2012-06-01 LAB — CBC WITH DIFFERENTIAL/PLATELET
Basophils Absolute: 0 10*3/uL (ref 0.0–0.1)
Basophils Relative: 0 % (ref 0–1)
Eosinophils Absolute: 0.4 10*3/uL (ref 0.0–0.7)
HCT: 38 % (ref 36.0–46.0)
Hemoglobin: 13.1 g/dL (ref 12.0–15.0)
MCH: 31.2 pg (ref 26.0–34.0)
MCHC: 34.5 g/dL (ref 30.0–36.0)
Monocytes Absolute: 0.4 10*3/uL (ref 0.1–1.0)
Monocytes Relative: 6 % (ref 3–12)
RDW: 14.3 % (ref 11.5–15.5)

## 2012-06-01 LAB — TROPONIN I: Troponin I: <0.3 ng/mL (ref <0.30)

## 2012-06-01 MED ORDER — ASPIRIN 325 MG PO TABS
325.0000 mg | ORAL_TABLET | Freq: Once | ORAL | Status: AC
  Administered 2012-06-01: 325 mg via ORAL
  Filled 2012-06-01: qty 1

## 2012-06-01 MED ORDER — CYANOCOBALAMIN 1000 MCG/ML IJ SOLN
1000.0000 ug | INTRAMUSCULAR | Status: DC
  Filled 2012-06-01: qty 1

## 2012-06-01 MED ORDER — LORAZEPAM 1 MG PO TABS
1.0000 mg | ORAL_TABLET | Freq: Once | ORAL | Status: AC
  Administered 2012-06-01: 1 mg via ORAL
  Filled 2012-06-01: qty 1

## 2012-06-01 MED ORDER — DULOXETINE HCL 60 MG PO CPEP
60.0000 mg | ORAL_CAPSULE | Freq: Every day | ORAL | Status: DC
  Administered 2012-06-01: 60 mg via ORAL
  Filled 2012-06-01 (×3): qty 1

## 2012-06-01 MED ORDER — PANTOPRAZOLE SODIUM 40 MG PO TBEC: 40.0000 mg | DELAYED_RELEASE_TABLET | Freq: Every day | ORAL | Status: DC

## 2012-06-01 MED ORDER — ALPRAZOLAM 1 MG PO TABS
1.0000 mg | ORAL_TABLET | Freq: Two times a day (BID) | ORAL | Status: DC
  Administered 2012-06-01: 1 mg via ORAL
  Filled 2012-06-01: qty 1

## 2012-06-01 MED ORDER — ASPIRIN EC 325 MG PO TBEC: 325.0000 mg | DELAYED_RELEASE_TABLET | Freq: Once | ORAL | Status: DC

## 2012-06-01 MED ORDER — MORPHINE SULFATE ER 30 MG PO TBCR: 30.0000 mg | EXTENDED_RELEASE_TABLET | Freq: Every morning | ORAL | Status: DC

## 2012-06-01 MED ORDER — OXYCODONE HCL 5 MG PO TABS
30.0000 mg | ORAL_TABLET | Freq: Four times a day (QID) | ORAL | Status: DC
  Administered 2012-06-01: 30 mg via ORAL
  Filled 2012-06-01: qty 6

## 2012-06-01 MED ORDER — AMITRIPTYLINE HCL 25 MG PO TABS
75.0000 mg | ORAL_TABLET | Freq: Every day | ORAL | Status: DC
  Filled 2012-06-01 (×3): qty 3

## 2012-06-01 MED ORDER — TRIAMTERENE-HCTZ 37.5-25 MG PO TABS
1.0000 | ORAL_TABLET | Freq: Every morning | ORAL | Status: DC
  Filled 2012-06-01 (×2): qty 1

## 2012-06-01 MED ORDER — CYCLOBENZAPRINE HCL 10 MG PO TABS: 10.0000 mg | ORAL_TABLET | Freq: Three times a day (TID) | ORAL | Status: DC | PRN

## 2012-06-01 NOTE — ED Provider Notes (Signed)
History    CSN: [id number]  Arrival date & time 06/01/12  1911   First MD Initiated Contact with Patient 06/01/12 1933      Chief Complaint  Patient presents with  . Chest Pain   HPI Comments: Started as indigestion, now feels like someone "hit her" in the left side of her chest.   Patient is a 52 y.o. female presenting with chest pain. The history is provided by the patient.  Chest Pain The chest pain began 3 - 5 hours ago. Chest pain occurs constantly. The chest pain is improving. The pain is associated with stress. At its most intense, the pain is at 6/10. The pain is currently at 2/10. The severity of the pain is severe. The quality of the pain is described as sharp and heavy. The pain radiates to the left neck and left jaw. Primary symptoms include cough and abdominal pain (Started as "bad case of indigestion"). Pertinent negatives for primary symptoms include no fever, no shortness of breath, no wheezing, no nausea and no vomiting.  The abdominal pain began today. The abdominal pain has been rapidly improving since its onset. The abdominal pain is located in the epigastric region. She tried nothing for the symptoms. Risk factors include smoking/tobacco exposure and stress.  Her past medical history is significant for anxiety/panic attacks.  Pertinent negatives for past medical history include no diabetes, no hyperlipidemia and no hypertension. Past medical history comments: Been worked up before for chest pain with normal stress test  Her family medical history is significant for CAD in family and diabetes in family.  Procedure history is positive for exercise treadmill test.  Procedure history is negative for cardiac catheterization.    Past Medical History  Diagnosis Date  . Allergic rhinitis   . Anxiety and depression   . Osteoarthritis   . PUD (peptic ulcer disease)   . Cervical cancer   . Gout   . DJD (degenerative joint disease) of lumbar spine   . Chronic back pain   .  GI bleed     Past Surgical History  Procedure Date  . Total abdominal hysterectomy   . Cervival cone   . Tubal ligation   . Appendectomy     Family History  Problem Relation Age of Onset  . Coronary artery disease Father   . Hypertension Father   . Diabetes type II Mother   . Alcohol abuse Mother     History  Substance Use Topics  . Smoking status: Current Every Day Smoker    Types: Cigarettes  . Smokeless tobacco: Never Used  . Alcohol Use: No    OB History    Grav Para Term Preterm Abortions TAB SAB Ect Mult Living                  Review of Systems  Constitutional: Negative for fever and chills.  HENT: Positive for neck pain. Negative for congestion and trouble swallowing.   Eyes: Negative for visual disturbance.  Respiratory: Positive for cough. Negative for shortness of breath and wheezing.   Cardiovascular: Positive for chest pain.  Gastrointestinal: Positive for abdominal pain (Started as "bad case of indigestion"). Negative for nausea and vomiting.  Skin: Negative for rash.  Neurological: Negative for light-headedness and headaches.  Psychiatric/Behavioral: The patient is nervous/anxious.   All other systems reviewed and are negative.    Allergies  Vicodin  Home Medications   Current Outpatient Rx  Name  Route  Sig  Dispense  Refill  .  ALPRAZOLAM 1 MG PO TABS   Oral   Take 1 mg by mouth 2 (two) times daily.           . CYCLOBENZAPRINE HCL 10 MG PO TABS   Oral   Take 10 mg by mouth 3 (three) times daily as needed. For muscle spasms          . OXYCODONE HCL 30 MG PO TABS   Oral   Take 30 mg by mouth 4 (four) times daily.           Marland Kitchen OXYMORPHONE HCL ER 20 MG PO TB12   Oral   Take 40 mg by mouth every morning. And take one tablet by mouth at bedtime          . PANTOPRAZOLE SODIUM 40 MG PO TBEC   Oral   Take 40 mg by mouth daily.           Marland Kitchen PRESCRIPTION MEDICATION   Subcutaneous   Inject 1 each into the skin every 30 (thirty)  days. Vitamin B-12 INJ: Adminstered by Renard Matter, MD office           BP 114/66  Pulse 69  Temp 98.2 F (36.8 C) (Oral)  SpO2 98%  LMP 09/16/2000  Physical Exam  Constitutional: She is oriented to person, place, and time. She appears well-developed and well-nourished. No distress.  HENT:  Head: Normocephalic and atraumatic.  Mouth/Throat: Oropharynx is clear and moist. No oropharyngeal exudate.  Eyes: Pupils are equal, round, and reactive to light.  Neck: Normal range of motion. Neck supple. Carotid bruit is not present.  Cardiovascular: Normal rate, regular rhythm, normal heart sounds and normal pulses.  Exam reveals no gallop.   No murmur heard. Pulmonary/Chest: Effort normal and breath sounds normal. No respiratory distress. She has no decreased breath sounds. She has no wheezes. She exhibits tenderness (Left sided reproducible pain). She exhibits no deformity.  Abdominal: Soft. Normal appearance and bowel sounds are normal. There is tenderness in the epigastric area.  Musculoskeletal: Normal range of motion. She exhibits no edema.  Lymphadenopathy:    She has no cervical adenopathy.  Neurological: She is alert and oriented to person, place, and time. No cranial nerve deficit.  Skin: Skin is warm and dry. No rash noted. She is not diaphoretic.  Psychiatric: She has a normal mood and affect. Thought content normal.    ED Course  Procedures (including critical care time)  Labs Reviewed  CBC WITH DIFFERENTIAL - Abnormal; Notable for the following:    Eosinophils Relative 6 (*)     All other components within normal limits  COMPREHENSIVE METABOLIC PANEL - Abnormal; Notable for the following:    CO2 33 (*)     Total Bilirubin 0.2 (*)     GFR calc non Af Amer 71 (*)     GFR calc Af Amer 82 (*)     All other components within normal limits  TROPONIN I   Dg Chest Portable 1 View  06/01/2012  *RADIOLOGY REPORT*  Clinical Data: Lower chest pain.  PORTABLE CHEST - 1 VIEW   Comparison: CT chest 05/12/2009.  PA and lateral chest 12/16/2010.  Findings: Lungs are clear.  Mild cardiomegaly is noted.  No pneumothorax or pleural effusion.  IMPRESSION: No acute disease.   Original Report Authenticated By: Holley Dexter, M.D.      No diagnosis found.  1940 - Patient seen and examined. Describes pain currently as a ache in her chest, but not sharp/stabbing/radiating currently  although it was previously. Given her past history of admissions for chest pain, will proceed with full work up including EKG, CXR, CBC, Cmet, Troponin. She has already received aspirin and nitro, but will give Ativan 1mg  x1 for anxiety (chronic condition, which she states is now exacerbated.)  2035- Labs and X-ray reviewed. All within normal limits. Troponin negative.   2100- Patient continues to have chest pain. Risk factors include smoking, prior chest pain, female with atypical symptoms. Will need hospital admission for cardiac rule out.  MDM  52 yo F with PMH of chronic pain, anxiety and GERD presenting with acute onset chest pain and indigestion. Given her history of prior cardiac work up including stress test without cardiac catherization, she will be admitted for chest pain rule out and risk stratification.   Date: 06/01/2012  Rate: 67  Rhythm: normal sinus rhythm with one PVC  QRS Axis: normal 104  Intervals: normal  ST/T Wave abnormalities: normal  Conduction Disutrbances: none  Narrative Interpretation: No apparent life threatening event  Old EKG Reviewed: No significant changes noted  Patient aware of admission and agrees with this plan. Dr. Felecia Shelling called and will accept patient.         Hilarie Fredrickson, MD 06/01/12 2120

## 2012-06-01 NOTE — ED Notes (Signed)
Patient had mid-sternal chest pain that started 2 hours ago that was radiating into her jaw.  Per EMS, patient has had some anxiety recently.  Patient ambulatory with steady gait.  Patient A&O; skin w/d. Respirations even and unlabored; able to speak in complete sentences without difficulty.

## 2012-06-01 NOTE — ED Notes (Signed)
MD at bedside. 

## 2012-06-01 NOTE — ED Provider Notes (Signed)
I have personally seen and examined the patient.  I have discussed the plan of care with the resident.  I have reviewed the documentation on PMH/FH/Soc. History.  I have reviewed the documentation of the resident and agree.  I have reviewed and agree with the ECG interpretation(s) documented by the resident.  Pt tells me she had chest pressure/diaphoresis.  Would benefit from admission and monitoring.  Stable in the ED.  No no dynamic EKG changes noted  Joya Gaskins, MD 06/01/12 2142

## 2012-06-02 NOTE — Progress Notes (Signed)
Pt stated that she needed to leave that her room smelled like death and that she could not stay in this hospital. Pt stated that her mother died about a year ago and that she was smelling that smell.Tried to console pt and offered to change rooms. Pt stated that she just could not stay here. AMA papers signed. Heplock removed, site WNL.

## 2012-06-02 NOTE — Progress Notes (Signed)
Dr. Felecia Shelling notified of pt leaving AMA.

## 2012-06-22 NOTE — Progress Notes (Signed)
UR Chart Review Completed  

## 2012-07-02 IMAGING — NM NM MYOCAR SINGLE W/SPECT W/WALL MOTION & EF
1 series · 6 of 6 positions shown · non-contrast
Comparison: none

Ordering Physician: XASHIN PARZALI

Taxidion Physician: [REDACTED]al Data: 50-year-old woman with history of significant
anxiety and depression, hyperlipidemia, tobacco abuse, and some
family history of CAD.  She presents with chest pain and has ruled
out for myocardial infarction.  This study is requested to evaluate
for the presence of ischemia.
NUCLEAR MEDICINE STRESS MYOVIEW STUDY WITH SPECT AND LEFT
VENTRICULAR EJECTION FRACTION
Radionuclide Data: One-day rest/stress protocol performed with
[DATE] mCi of Ic-00m Myoview.
Stress Data: Lexiscan bolus was given per protocol with concurrent
ambulation on the treadmill.  No clearly diagnostic ST-segment
changes were noted by standard criteria. No arrhythmias were noted.
Peak heart rate was 140 beats per minute, 83% of the maximum
predicted heart rate response.  Blood pressure increased from
128/68 up to 160/66.
EKG: Baseline ECG shows normal sinus rhythm at 66 beats per minute
with poor R-wave progression, nonspecific T-wave changes.
Scintigraphic Data: Analysis of the raw perfusion data shows
evidence of breast attenuation.
Tomographic views were obtained using the short axis, vertical long
axis, and horizontal long axis planes.  There is a mild
anteroseptal defect at the apex that is consistent with breast
attenuation.  Partially reversible, small, mild intensity defect
noted at the inferior apex, suggests possible mild degree of
ischemia in this distribution.  No large reversible perfusion
defects are noted however.
Gated imaging reveals an EDV of 144, ESV of 62, T I D ratio of
1.08, and LVEF of 57%.

[cr cardiac tc low dose · 6.41mm/px · 6 of 64 frames shown]
[frame 6/64]
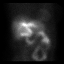
[frame 16/64]
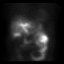
[frame 27/64]
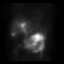
[frame 38/64]
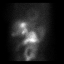
[frame 48/64]
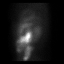
[frame 59/64]
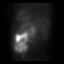

[6 of 6 positions shown; findings below may reference images not displayed]

IMPRESSION: Mildly abnormal, overall low risk, Lexican Myoview as outlined.  No
diagnostic ST-segment changes were noted during concurrent
ambulation on the treadmill.  Perfusion imaging shows evidence of
breast attenuation.  Possible small area of inferoapical ischemia
noted.  LVEF is normal at 57%.

## 2012-07-17 ENCOUNTER — Encounter: Payer: Self-pay | Admitting: *Deleted

## 2012-07-18 ENCOUNTER — Telehealth: Payer: Self-pay | Admitting: Cardiovascular Disease

## 2012-07-18 ENCOUNTER — Encounter: Payer: Self-pay | Admitting: Cardiovascular Disease

## 2012-07-18 ENCOUNTER — Ambulatory Visit (INDEPENDENT_AMBULATORY_CARE_PROVIDER_SITE_OTHER): Payer: PRIVATE HEALTH INSURANCE | Admitting: Cardiovascular Disease

## 2012-07-18 VITALS — BP 108/76 | HR 80 | Ht 64.0 in | Wt 180.4 lb

## 2012-07-18 DIAGNOSIS — R0989 Other specified symptoms and signs involving the circulatory and respiratory systems: Secondary | ICD-10-CM

## 2012-07-18 DIAGNOSIS — R079 Chest pain, unspecified: Secondary | ICD-10-CM

## 2012-07-18 DIAGNOSIS — R0600 Dyspnea, unspecified: Secondary | ICD-10-CM

## 2012-07-18 NOTE — Patient Instructions (Signed)
Your physician recommends that you schedule a follow-up appointment in: As needed  Your physician has requested that you have an exercise tolerance test. For further information please visit [email or web address] Please also follow instruction sheet, as given.  Your physician has requested that you have an echocardiogram. Echocardiography is a painless test that uses sound waves to create images of your heart. It provides your doctor with information about the size and shape of your heart and how well your heart's chambers and valves are working. This procedure takes approximately one hour. There are no restrictions for this procedure.

## 2012-07-18 NOTE — Telephone Encounter (Signed)
JUST FYI. PT STATES SHE HAS HAD SEVERAL STRESS TEST DONE IN PAST AND THEY NEVER SHOW ANYTHING WRONG. SHE IS NOT SCHEDULING ANOTHER ONE AT THIS TIME. SHE DID SCHEDULES 2-D ECHO FOR 07/21/12/TMJ

## 2012-07-18 NOTE — Telephone Encounter (Signed)
Noted MD PN made aware verbally

## 2012-07-18 NOTE — Assessment & Plan Note (Signed)
Counseled no motivation to quit CXR 2/14 ok no active wheezing f/u primary

## 2012-07-18 NOTE — Assessment & Plan Note (Signed)
Likely related to anxiety and smoking F/U echo
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
## 2012-07-18 NOTE — Assessment & Plan Note (Signed)
Atypical negative ER w/u but left AMA  F/U ETT  Echo to assess RWMA;s

## 2012-07-21 ENCOUNTER — Ambulatory Visit (HOSPITAL_COMMUNITY): Payer: PRIVATE HEALTH INSURANCE | Attending: Cardiovascular Disease

## 2012-10-20 ENCOUNTER — Other Ambulatory Visit (HOSPITAL_COMMUNITY): Payer: PRIVATE HEALTH INSURANCE

## 2012-10-23 ENCOUNTER — Other Ambulatory Visit (HOSPITAL_COMMUNITY): Payer: Self-pay | Admitting: Family Medicine

## 2012-10-23 ENCOUNTER — Ambulatory Visit (HOSPITAL_COMMUNITY)
Admission: RE | Admit: 2012-10-23 | Discharge: 2012-10-23 | Disposition: A | Discharge status: Home / Self Care | Payer: Medicaid Other | Source: Ambulatory Visit | Attending: Cardiovascular Disease | Admitting: Cardiovascular Disease

## 2012-10-23 DIAGNOSIS — Z139 Encounter for screening, unspecified: Secondary | ICD-10-CM

## 2012-10-23 DIAGNOSIS — F17200 Nicotine dependence, unspecified, uncomplicated: Secondary | ICD-10-CM | POA: Insufficient documentation

## 2012-10-23 DIAGNOSIS — I359 Nonrheumatic aortic valve disorder, unspecified: Secondary | ICD-10-CM

## 2012-10-23 DIAGNOSIS — R079 Chest pain, unspecified: Secondary | ICD-10-CM | POA: Insufficient documentation

## 2012-10-23 NOTE — Progress Notes (Signed)
*  PRELIMINARY RESULTS* Echocardiogram 2D Echocardiogram has been performed.  [name] 10/23/2012, 2:01 PM

## 2012-10-24 ENCOUNTER — Ambulatory Visit (HOSPITAL_COMMUNITY)
Admission: RE | Admit: 2012-10-24 | Discharge: 2012-10-24 | Disposition: A | Discharge status: Home / Self Care | Payer: Medicaid Other | Source: Ambulatory Visit | Attending: Family Medicine | Admitting: Family Medicine

## 2012-10-24 ENCOUNTER — Other Ambulatory Visit (HOSPITAL_COMMUNITY): Payer: Self-pay | Admitting: Family Medicine

## 2012-10-24 DIAGNOSIS — Z139 Encounter for screening, unspecified: Secondary | ICD-10-CM

## 2012-10-24 DIAGNOSIS — M79609 Pain in unspecified limb: Secondary | ICD-10-CM | POA: Insufficient documentation

## 2012-10-24 DIAGNOSIS — G8929 Other chronic pain: Secondary | ICD-10-CM

## 2012-10-24 DIAGNOSIS — Z1231 Encounter for screening mammogram for malignant neoplasm of breast: Secondary | ICD-10-CM | POA: Insufficient documentation

## 2012-10-30 ENCOUNTER — Telehealth: Payer: Self-pay | Admitting: Cardiovascular Disease

## 2012-10-30 NOTE — Telephone Encounter (Signed)
LMTCB ./CY 

## 2012-10-30 NOTE — Telephone Encounter (Signed)
Follow-up: ° ° ° °Patient called in returning your call.  Please call back. °

## 2012-10-30 NOTE — Telephone Encounter (Signed)
New Problem  Pt is wanting to know the results of her echo. She said she received 3 calls from our office so she is a little concerned.

## 2012-11-01 NOTE — Telephone Encounter (Signed)
PT AWARE OF ECHO RESULTS./CY 

## 2013-01-06 ENCOUNTER — Encounter (HOSPITAL_COMMUNITY): Payer: Self-pay

## 2013-01-06 ENCOUNTER — Emergency Department (HOSPITAL_COMMUNITY): Payer: Medicaid Other

## 2013-01-06 ENCOUNTER — Observation Stay (HOSPITAL_COMMUNITY)
Admission: EM | Admit: 2013-01-06 | Discharge: 2013-01-07 | Disposition: A | Discharge status: Home / Self Care | Payer: Medicaid Other | Attending: Family Medicine | Admitting: Family Medicine

## 2013-01-06 DIAGNOSIS — I10 Essential (primary) hypertension: Secondary | ICD-10-CM | POA: Diagnosis present

## 2013-01-06 DIAGNOSIS — G8929 Other chronic pain: Secondary | ICD-10-CM | POA: Diagnosis present

## 2013-01-06 DIAGNOSIS — Z8711 Personal history of peptic ulcer disease: Secondary | ICD-10-CM | POA: Insufficient documentation

## 2013-01-06 DIAGNOSIS — F3289 Other specified depressive episodes: Secondary | ICD-10-CM | POA: Insufficient documentation

## 2013-01-06 DIAGNOSIS — Z859 Personal history of malignant neoplasm, unspecified: Secondary | ICD-10-CM | POA: Insufficient documentation

## 2013-01-06 DIAGNOSIS — K219 Gastro-esophageal reflux disease without esophagitis: Secondary | ICD-10-CM | POA: Insufficient documentation

## 2013-01-06 DIAGNOSIS — M109 Gout, unspecified: Secondary | ICD-10-CM | POA: Insufficient documentation

## 2013-01-06 DIAGNOSIS — E876 Hypokalemia: Secondary | ICD-10-CM | POA: Insufficient documentation

## 2013-01-06 DIAGNOSIS — F17200 Nicotine dependence, unspecified, uncomplicated: Secondary | ICD-10-CM | POA: Diagnosis present

## 2013-01-06 DIAGNOSIS — Z79899 Other long term (current) drug therapy: Secondary | ICD-10-CM | POA: Insufficient documentation

## 2013-01-06 DIAGNOSIS — F32A Depression, unspecified: Secondary | ICD-10-CM | POA: Diagnosis present

## 2013-01-06 DIAGNOSIS — R079 Chest pain, unspecified: Secondary | ICD-10-CM | POA: Diagnosis present

## 2013-01-06 DIAGNOSIS — R0789 Other chest pain: Principal | ICD-10-CM | POA: Insufficient documentation

## 2013-01-06 DIAGNOSIS — M1990 Unspecified osteoarthritis, unspecified site: Secondary | ICD-10-CM | POA: Insufficient documentation

## 2013-01-06 DIAGNOSIS — G894 Chronic pain syndrome: Secondary | ICD-10-CM | POA: Insufficient documentation

## 2013-01-06 DIAGNOSIS — F329 Major depressive disorder, single episode, unspecified: Secondary | ICD-10-CM | POA: Diagnosis present

## 2013-01-06 DIAGNOSIS — J309 Allergic rhinitis, unspecified: Secondary | ICD-10-CM | POA: Insufficient documentation

## 2013-01-06 DIAGNOSIS — F411 Generalized anxiety disorder: Secondary | ICD-10-CM | POA: Diagnosis present

## 2013-01-06 DIAGNOSIS — M549 Dorsalgia, unspecified: Secondary | ICD-10-CM | POA: Insufficient documentation

## 2013-01-06 LAB — BASIC METABOLIC PANEL
BUN: 16 mg/dL (ref 6–23)
Creatinine, Ser: 0.82 mg/dL (ref 0.50–1.10)
GFR calc Af Amer: >90 mL/min (ref >90)
GFR calc non Af Amer: 80 mL/min — ABNORMAL LOW (ref >90)
Glucose, Bld: 110 mg/dL — ABNORMAL HIGH (ref 70–99)

## 2013-01-06 LAB — CBC WITH DIFFERENTIAL/PLATELET
Lymphocytes Relative: 32 % (ref 12–46)
Neutro Abs: 6 10*3/uL (ref 1.7–7.7)
Platelets: 319 10*3/uL (ref 150–400)
RDW: 14.8 % (ref 11.5–15.5)
WBC: 10.5 10*3/uL (ref 4.0–10.5)

## 2013-01-06 LAB — RAPID URINE DRUG SCREEN, HOSP PERFORMED
Barbiturates: NOT DETECTED
Benzodiazepines: POSITIVE — AB

## 2013-01-06 MED ORDER — ASPIRIN 81 MG PO CHEW
324.0000 mg | CHEWABLE_TABLET | Freq: Once | ORAL | Status: AC
  Administered 2013-01-06: 324 mg via ORAL
  Filled 2013-01-06: qty 4

## 2013-01-06 MED ORDER — ACETAMINOPHEN 325 MG PO TABS: 650.0000 mg | ORAL_TABLET | Freq: Four times a day (QID) | ORAL | Status: DC | PRN

## 2013-01-06 MED ORDER — MORPHINE SULFATE ER 30 MG PO TBCR
60.0000 mg | EXTENDED_RELEASE_TABLET | Freq: Two times a day (BID) | ORAL | Status: DC
  Administered 2013-01-06 (×2): 60 mg via ORAL
  Filled 2013-01-06 (×2): qty 2

## 2013-01-06 MED ORDER — OXYCODONE HCL 5 MG PO TABS
30.0000 mg | ORAL_TABLET | Freq: Four times a day (QID) | ORAL | Status: DC | PRN
  Administered 2013-01-06 (×2): 30 mg via ORAL
  Filled 2013-01-06: qty 1
  Filled 2013-01-06: qty 5
  Filled 2013-01-06: qty 6

## 2013-01-06 MED ORDER — ALPRAZOLAM 1 MG PO TABS
1.0000 mg | ORAL_TABLET | Freq: Two times a day (BID) | ORAL | Status: DC
  Administered 2013-01-06 (×2): 1 mg via ORAL
  Filled 2013-01-06 (×2): qty 1

## 2013-01-06 MED ORDER — TRIAMTERENE-HCTZ 37.5-25 MG PO TABS
1.0000 | ORAL_TABLET | Freq: Every morning | ORAL | Status: DC
  Administered 2013-01-06: 1 via ORAL
  Filled 2013-01-06: qty 1

## 2013-01-06 MED ORDER — MORPHINE SULFATE 4 MG/ML IJ SOLN
4.0000 mg | Freq: Once | INTRAMUSCULAR | Status: AC
  Administered 2013-01-06: 4 mg via INTRAVENOUS
  Filled 2013-01-06: qty 1

## 2013-01-06 MED ORDER — ENSURE COMPLETE PO LIQD
237.0000 mL | Freq: Two times a day (BID) | ORAL | Status: DC
  Administered 2013-01-06: 237 mL via ORAL

## 2013-01-06 MED ORDER — NITROGLYCERIN 0.4 MG SL SUBL: 0.4000 mg | SUBLINGUAL_TABLET | SUBLINGUAL | Status: DC | PRN

## 2013-01-06 MED ORDER — SODIUM CHLORIDE 0.9 % IV SOLN
Freq: Once | INTRAVENOUS | Status: AC
  Administered 2013-01-06: 06:00:00 via INTRAVENOUS

## 2013-01-06 MED ORDER — ENOXAPARIN SODIUM 40 MG/0.4ML ~~LOC~~ SOLN
40.0000 mg | SUBCUTANEOUS | Status: DC
  Administered 2013-01-06: 40 mg via SUBCUTANEOUS
  Filled 2013-01-06: qty 0.4

## 2013-01-06 MED ORDER — SODIUM CHLORIDE 0.9 % IV SOLN
INTRAVENOUS | Status: DC
  Administered 2013-01-06: 10:00:00 via INTRAVENOUS

## 2013-01-06 MED ORDER — ASPIRIN EC 81 MG PO TBEC
81.0000 mg | DELAYED_RELEASE_TABLET | Freq: Every day | ORAL | Status: DC
  Administered 2013-01-06: 81 mg via ORAL
  Filled 2013-01-06: qty 1

## 2013-01-06 MED ORDER — ACETAMINOPHEN 650 MG RE SUPP: 650.0000 mg | Freq: Four times a day (QID) | RECTAL | Status: DC | PRN

## 2013-01-06 MED ORDER — DIPHENHYDRAMINE HCL 50 MG/ML IJ SOLN
25.0000 mg | Freq: Once | INTRAMUSCULAR | Status: AC
  Administered 2013-01-06: 25 mg via INTRAVENOUS
  Filled 2013-01-06: qty 1

## 2013-01-06 MED ORDER — POTASSIUM CHLORIDE CRYS ER 20 MEQ PO TBCR
40.0000 meq | EXTENDED_RELEASE_TABLET | Freq: Once | ORAL | Status: AC
  Administered 2013-01-06: 40 meq via ORAL
  Filled 2013-01-06: qty 2

## 2013-01-06 MED ORDER — NITROGLYCERIN 0.4 MG SL SUBL
0.4000 mg | SUBLINGUAL_TABLET | SUBLINGUAL | Status: DC | PRN
  Administered 2013-01-06: 0.4 mg via SUBLINGUAL
  Filled 2013-01-06: qty 25

## 2013-01-06 MED ORDER — ONDANSETRON HCL 4 MG/2ML IJ SOLN: 4.0000 mg | Freq: Four times a day (QID) | INTRAMUSCULAR | Status: DC | PRN

## 2013-01-06 MED ORDER — AMITRIPTYLINE HCL 25 MG PO TABS
75.0000 mg | ORAL_TABLET | Freq: Every day | ORAL | Status: DC
  Administered 2013-01-06: 75 mg via ORAL
  Filled 2013-01-06: qty 3

## 2013-01-06 MED ORDER — CYCLOBENZAPRINE HCL 10 MG PO TABS: 10.0000 mg | ORAL_TABLET | Freq: Three times a day (TID) | ORAL | Status: DC | PRN

## 2013-01-06 MED ORDER — DULOXETINE HCL 60 MG PO CPEP
60.0000 mg | ORAL_CAPSULE | Freq: Every day | ORAL | Status: DC
  Administered 2013-01-06: 60 mg via ORAL
  Filled 2013-01-06: qty 1

## 2013-01-06 MED ORDER — PANTOPRAZOLE SODIUM 40 MG PO TBEC
40.0000 mg | DELAYED_RELEASE_TABLET | Freq: Two times a day (BID) | ORAL | Status: DC
  Administered 2013-01-06 (×2): 40 mg via ORAL
  Filled 2013-01-06 (×2): qty 1

## 2013-01-06 MED ORDER — ONDANSETRON HCL 4 MG PO TABS: 4.0000 mg | ORAL_TABLET | Freq: Four times a day (QID) | ORAL | Status: DC | PRN

## 2013-01-06 MED ORDER — SODIUM CHLORIDE 0.9 % IJ SOLN: 3.0000 mL | Freq: Two times a day (BID) | INTRAMUSCULAR | Status: DC
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
## 2013-01-06 NOTE — Progress Notes (Signed)
INITIAL NUTRITION ASSESSMENT  DOCUMENTATION CODES Per approved criteria  -Obesity Unspecified   INTERVENTION: Ensure Complete po BID, each supplement provides 350 kcal and 13 grams of protein.  NUTRITION DIAGNOSIS: Inadequate oral intake related to decreased appetite as evidenced by PO: 0-15%.   Goal: Pt will meet >90% of estimated nutritional needs  Monitor:  PO intake, labs, weight changes, skin integrity, changes in status  Reason for Assessment: MST=3  52 y.o. female  Admitting Dx: Chest pain  ASSESSMENT: Pt admitted with chest pain. Pt asleep at time of visit and unable to arouse.  Chart reviewed. Wt has fluctuated the past year between 192-176#. 5# (2.7%) wt loss x 6 months and 17# (8.8%) wt loss x 6 months are both not clinically significant.  Recorded PO intake 15% at breakfast. Noted that pt's lunch tray was unattempted.   Height: Ht Readings from Last 1 Encounters:  01/06/13 5\' 4"  (1.626 m)    Weight: Wt Readings from Last 1 Encounters:  01/06/13 175 lb (79.379 kg)    Ideal Body Weight: 120#  % Ideal Body Weight: 146%  Wt Readings from Last 10 Encounters:  01/06/13 175 lb (79.379 kg)  07/18/12 180 lb 6.4 oz (81.829 kg)  06/01/12 192 lb 6.4 oz (87.272 kg)  12/18/10 176 lb 9.4 oz (80.1 kg)  06/23/07 154 lb (69.854 kg)    Usual Body Weight: 176#  % Usual Body Weight: 99%  BMI:  Body mass index is 30.02 kg/(m^2). Meets criteria for obesity, class I.  Estimated Nutritional Needs: Kcal: 1300-1400 daily Protein: 64-80 grams daily Fluid: 1.3-1.4 L daily  Skin: Intact  Diet Order: Cardiac  EDUCATION NEEDS: -Education not appropriate at this time   Intake/Output Summary (Last 24 hours) at 01/06/13 1302 Last data filed at 01/06/13 1021  Gross per 24 hour  Intake    310 ml  Output      0 ml  Net    310 ml    Last BM: 01/05/13  Labs:   Recent Labs Lab 01/06/13 0537  NA 137  K 3.3*  CL 98  CO2 29  BUN 16  CREATININE 0.82  CALCIUM  9.4  GLUCOSE 110*    CBG (last 3)  No results found for this basename: GLUCAP,  in the last 72 hours  Scheduled Meds: . ALPRAZolam  1 mg Oral BID  . amitriptyline  75 mg Oral QHS  . aspirin EC  81 mg Oral Daily  . DULoxetine  60 mg Oral QHS  . enoxaparin (LOVENOX) injection  40 mg Subcutaneous Q24H  . morphine  60 mg Oral Q12H  . pantoprazole  40 mg Oral BID  . sodium chloride  3 mL Intravenous Q12H  . triamterene-hydrochlorothiazide  1 tablet Oral q morning - 10a    Continuous Infusions: . sodium chloride 75 mL/hr at 01/06/13 1008    Past Medical History  Diagnosis Date  . Allergic rhinitis   . Anxiety and depression   . Osteoarthritis   . PUD (peptic ulcer disease)   . Cervical cancer   . Gout   . DJD (degenerative joint disease) of lumbar spine   . Chronic back pain   . GI bleed     Past Surgical History  Procedure Laterality Date  . Total abdominal hysterectomy    . Cervival cone    . Tubal ligation    . Appendectomy    . Throat surgery     Boyce Keltner A. Mayford Knife, RD, LDN Pager: [phone]

## 2013-01-06 NOTE — ED Notes (Signed)
Tightness in chest onset 2 hours ago

## 2013-01-06 NOTE — Progress Notes (Signed)
Patient requested to walk in hall, explained to patient that was ok, but do not leave unit. Patient was later observed by security smoking outside. Upon patient return, patient educated on smoking policy and the consequences if goes back outside. Patient also educated on importance of needing urine sample, patient voids in toilet and not in specimen.

## 2013-01-06 NOTE — H&P (Signed)
[facility] History and Physical  [name] JXB:[id number] DOB: Sep 14, 1960 DOA: 01/06/2013  Referring physician: Dr. Preston Fleeting, ER physician PCP: Alice Reichert, MD  Specialists: Cardiologist: Dr. Eden Emms  Chief Complaint: Chest pain  HPI: [name] is a 52 y.o. female presents the emergency room with complaints of chest discomfort. She describes severe, sharp left-sided chest pain or crepitus sleep approximately 3 AM this morning. She ended up taking one nitroglycerin at home without any significant relief. She was brought to the hospital where she received aspirin nitroglycerin as well as morphine which improved her symptoms. She reports her pain radiated across her chest, she may have had some jaw pain as well. She had associated shortness of breath, no diaphoresis, no nausea, some lightheadedness. He reports having these symptoms for the last year, but worse today. She was evaluated in the emergency room where he EKG did not show any acute changes. Cardiac enzymes are negative. Due to her risk factors, overnight observation was recommended. Hospitalists were requested to assist with admission. Of note, patient was seen by cardiology in 06/2012. At that time echocardiogram and stress test were ordered. Echocardiogram showed preserved systolic function, I could not locate results of stress test. She reports having significant stressors in her life that may be contributing to her symptoms.   Review of Systems: Pertinent positives as per history of present illness, otherwise negative  Past Medical History  Diagnosis Date  . Allergic rhinitis   . Anxiety and depression   . Osteoarthritis   . PUD (peptic ulcer disease)   . Cervical cancer   . Gout   . DJD (degenerative joint disease) of lumbar spine   . Chronic back pain   . GI bleed    Past Surgical History  Procedure Laterality Date  . Total abdominal hysterectomy    . Cervival cone    . Tubal ligation    . Appendectomy     . Throat surgery     Social History:  reports that she has been smoking Cigarettes.  She has a 80 pack-year smoking history. She has never used smokeless tobacco. She reports that she does not drink alcohol or use illicit drugs.   Allergies  Allergen Reactions  . Vicodin [Hydrocodone-Acetaminophen] Itching    Family History  Problem Relation Age of Onset  . Coronary artery disease Father   . Hypertension Father   . Diabetes type II Mother   . Alcohol abuse Mother     Prior to Admission medications   Medication Sig Start Date End Date Taking? Authorizing Provider  ALPRAZolam Prudy Feeler) 1 MG tablet Take 1 mg by mouth 2 (two) times daily.     Yes Historical Provider, MD  cephALEXin (KEFLEX) 500 MG capsule Take 500 mg by mouth 4 (four) times daily.   Yes Historical Provider, MD  cyclobenzaprine (FLEXERIL) 10 MG tablet Take 10 mg by mouth 3 (three) times daily as needed. For muscle spasms    Yes Historical Provider, MD  CYMBALTA 60 MG capsule Take 60 mg by mouth at bedtime. 03/19/12  Yes Historical Provider, MD  nitroGLYCERIN (NITROSTAT) 0.3 MG SL tablet Place 0.3 mg under the tongue every 5 (five) minutes as needed for chest pain.   Yes Historical Provider, MD  omeprazole (PRILOSEC) 40 MG capsule Take 40 mg by mouth Twice daily. 03/29/12  Yes Historical Provider, MD  oxycodone (ROXICODONE) 30 MG immediate release tablet Take 30 mg by mouth 4 (four) times daily.     Yes Historical Provider,  MD  oxymorphone (OPANA ER) 20 MG 12 hr tablet Take 20-40 mg by mouth 2 (two) times daily. Take two tablets in the morning And take one tablet by mouth at bedtime   Yes Historical Provider, MD  triamterene-hydrochlorothiazide (MAXZIDE-25) 37.5-25 MG per tablet Take 1 tablet by mouth every morning.  04/08/12  Yes Historical Provider, MD  amitriptyline (ELAVIL) 75 MG tablet Take 75 mg by mouth at bedtime. 03/29/12   Historical Provider, MD  cyanocobalamin (,VITAMIN B-12,) 1000 MCG/ML injection Inject 1 mL into  the muscle Every month. 05/05/12   Historical Provider, MD   Physical Exam: Filed Vitals:   01/06/13 0834  BP: 105/59  Pulse: 57  Temp:   Resp: 20     General:  No acute distress  Eyes: Pupils are equal round react to light  ENT: Mucous membranes are moist  Neck: Supple  Cardiovascular: S1, S2, regular rate and rhythm, with some tenderness to palpation over left breast which the patient describes is different than the discomfort she had earlier today.  Respiratory: Clear to auscultation bilaterally  Abdomen: Soft, nontender, positive bowel sounds  Skin: No rashes  Musculoskeletal: No pedal edema bilaterally  Psychiatric: Somewhat anxious, cooperative with exam  Neurologic:  Grossly intact, nonfocal  Labs on Admission:  Basic Metabolic Panel:  Recent Labs Lab 01/06/13 0537  NA 137  K 3.3*  CL 98  CO2 29  GLUCOSE 110*  BUN 16  CREATININE 0.82  CALCIUM 9.4   Liver Function Tests: No results found for this basename: AST, ALT, ALKPHOS, BILITOT, PROT, ALBUMIN,  in the last 168 hours No results found for this basename: LIPASE, AMYLASE,  in the last 168 hours No results found for this basename: AMMONIA,  in the last 168 hours CBC:  Recent Labs Lab 01/06/13 0537  WBC 10.5  NEUTROABS 6.0  HGB 15.5*  HCT 45.5  MCV 90.5  PLT 319   Cardiac Enzymes:  Recent Labs Lab 01/06/13 0537  TROPONINI <0.30    BNP (last 3 results) No results found for this basename: PROBNP,  in the last 8760 hours CBG: No results found for this basename: GLUCAP,  in the last 168 hours  Radiological Exams on Admission: Dg Chest Port 1 View  01/06/2013   CLINICAL DATA:  Chest tightness  EXAM: PORTABLE CHEST - 1 VIEW  COMPARISON:  06/01/2012  FINDINGS: The heart size and mediastinal contours are within normal limits. Both lungs are clear. The visualized skeletal structures are unremarkable.  IMPRESSION: No active disease.   Electronically Signed   By: Tiburcio Pea   On:  01/06/2013 06:07    EKG: Independently reviewed. T wave inversions in V2 and V3 (old)  Assessment/Plan Principal Problem:   Chest pain Active Problems:   ANXIETY   TOBACCO ABUSE   Chronic pain   Depression   Hypertension   1. Chest pain. Appear somewhat atypical. Possibly related to anxiety. She also has significant reflux and is on twice a day PPI. Due to her risk factors, she'll be admitted to telemetry overnight. We will cycle cardiac enzymes and repeat EKG in the morning. If she rules out for ACS, she will need followup with cardiology as an outpatient. D-dimer was also found to be negative. 2. Hypokalemia, likely related to hydrochlorothiazide, replace 3. Anxiety. Continue home dose of Xanax 4. Chronic pain syndrome. Continue long and short acting opiates as she takes at home. 5. Tobacco abuse. Counseled on the importance of tobacco cessation.  Patient will be  admitted to the service of Dr. Renard Matter. [facility] will be available for any issues until 7 AM on 9/14.    Code Status: full code Family Communication: discussed with patient Disposition Plan: probable discharge home in am  Time spent:71mins  Ege Muckey [facility] Pager [phone]  If 7PM-7AM, please contact night-coverage [email or web address] Password [email or web address] 01/06/2013, 8:45 AM

## 2013-01-07 LAB — BASIC METABOLIC PANEL
CO2: 31 mEq/L (ref 19–32)
Calcium: 9.3 mg/dL (ref 8.4–10.5)
Chloride: 102 mEq/L (ref 96–112)
Sodium: 140 mEq/L (ref 135–145)

## 2013-01-07 LAB — CBC
MCV: 91.2 fL (ref 78.0–100.0)
Platelets: 286 10*3/uL (ref 150–400)
RBC: 4.45 MIL/uL (ref 3.87–5.11)
WBC: 6.9 10*3/uL (ref 4.0–10.5)

## 2013-01-07 NOTE — Discharge Summary (Signed)
NAMESINAYA, MINOGUE           ACCOUNT NO.:  [id number]  MEDICAL RECORD NO.:  [id number]  LOCATION:  A323                          FACILITY:  [facility]  PHYSICIAN:  Deston Bilyeu G. Rebekka Lobello, MD   DATE OF BIRTH:  March 26, 1961  DATE OF ADMISSION:  01/06/2013 DATE OF DISCHARGE:  09/14/2014LH                              DISCHARGE SUMMARY   ADDENDUM:  Patient will be discharged on the following medications: Opana ER 20 mg twice daily, oxycodone 30 mg four times a day, Xanax 1 mg by mouth two times a day, Flexeril 10 mg t.i.d., vitamin B12 one mL once a month, Maxzide 25 mg daily, Prilosec 40 mg daily, Cymbalta 60 mg daily at bedtime, Nitrostat 0.3 mg under tongue every 5 minutes as needed for chest pain, Keflex 500 mg b.i.d.  Patient was stable at the time of her discharge.     Elfie Costanza G. Renard Matter, MD     AGM/MEDQ  D:  01/07/2013  T:  01/07/2013  Job:  [id number]

## 2013-01-07 NOTE — Progress Notes (Signed)
01/07/13 1610 Reviewed discharge instructions with patient. Given copy of instructions, medication list, f/u appointment information. Pt to call MD office on Monday for appointment as instructed. Verbalized understanding of instructions and when to call MD if further chest pain, shortness of breath. Denies chest pain this am. IV site d/c'd, within normal limits. Notified CMT of telemetry d/c for discharge. Pt left floor in stable condition via w/c accompanied by nurse techs. Earnstine Regal, RN

## 2013-01-07 NOTE — Progress Notes (Signed)
01/07/13 0735 Patient to be discharged per Dr. Renard Matter. Requested EKG completed prior to discharge, notified pt had EKG this morning about 0500. Dr. Renard Matter reviewed EKG result, stated okay for discharge home. Morrie Sheldon Evelisse Szalkowski,RN

## 2013-01-07 NOTE — Discharge Summary (Signed)
NAMENUMA, HEATWOLE           ACCOUNT NO.:  [id number]  MEDICAL RECORD NO.:  [id number]  LOCATION:  A323                          FACILITY:  [facility]  PHYSICIAN:  Teonia Yager G. Renard Matter, MD   DATE OF BIRTH:  1960/11/14  DATE OF ADMISSION:  01/06/2013 DATE OF DISCHARGE:  09/14/2014LH                              DISCHARGE SUMMARY   DIAGNOSES: 1. Atypical chest pain. 2. Probable gastroesophageal reflux disease. 3. Hypokalemia. 4. Anxiety. 5. Chronic pain syndrome. 6. Prior history of peptic ulcer disease.  CONDITION:  Stable at the time of her discharge.  This 52 year old female presented to the emergency department with chest discomfort which she described as severe sharp left-sided chest pain which occurred approximately at 3:00 a.m. in the morning.  She took a nitroglycerin at home without relief.  Came to the hospital where received aspirin, nitroglycerin, and morphine which improved her symptoms.  Pain radiated across the chest.  She has had these symptoms previously.  An electrocardiogram did not show any acute changes and cardiac markers were negative.  She had been seen by Cardiology in March, 2014 with echocardiogram showed preserved systolic function.  PHYSICAL EXAMINATION:  VITAL SIGNS:  On admission; blood pressure 105/59, pulse 57, temp 98, respirations 20. GENERAL APPEARANCE:  Patient appear to be in no acute distress. HEENT:  Eyes, PERRLA.  TMs negative.  Oropharynx benign. NECK:  Supple.  No JVD or thyroid abnormalities. HEART:  Regular rhythm.  No murmurs.  Tenderness over the left breast. LUNGS:  Clear to P and A. ABDOMEN:  No palpable organs or masses. SKIN:  Warm and dry. NEUROLOGICAL:  Grossly intact.  No focal areas of concern.  LABORATORY DATA:  On admission, CBC:  WBC 10,500 with hemoglobin 15.5, hematocrit 45.5.  Chemistries; sodium 137, potassium 3.3, chloride 98, CO2 of 29, glucose 110.  BUN 16, creatinine 0.82.  Calcium 9.2. Troponin less than 0.3.   D-dimer is 0.30.  TSH is 0.780.  Rapid drug screen was positive for opiates, positive for benzodiazepine.  Chemistry panel on  September 14; sodium 140, potassium 4.2, chloride 102, CO2 of 31, glucose 103, BUN 17, creatinine 0.89, calcium 9.3.  RADIOLOGY:  Chest x-ray, heart size and mediastinal contours were within normal limits.  Lungs are clear.  No active disease.  EKG, T-wave inversion in V2 and 3 old.  HOSPITAL COURSE:  Patient was admitted on telemetry to med/surg bed. She was continued on the following medications.  Amitriptyline 75 mg at bedtime, Xanax 1 mg b.i.d., aspirin 81 mg daily, Cymbalta 60 mg at bedtime, pantoprazole 40 mg b.i.d.  She was given IV fluids, saline, continued on Maxzide 25 mg daily.  Lovenox was given subcutaneously 40 mg for DVT prophylaxis, and patient was continued on oxycodone 30 mg every 6 hours p.r.n., Flexeril 10 mg t.i.d. p.r.n., and Tylenol.  She did not have any further chest pain similar to that she had previously, but did describe the soreness over the left chest wall, some slight soreness over left lower leg.  PLAN:  Discharge patient the following EKG this morning and follow up as outpatient.     Clarke Amburn G. Renard Matter, MD     AGM/MEDQ  D:  01/07/2013  T:  01/07/2013  Job:  [id number]

## 2013-01-07 NOTE — Progress Notes (Signed)
[name], [name]           ACCOUNT NO.:  [id number]  MEDICAL RECORD NO.:  [id number]  LOCATION:  A323                          FACILITY:  [facility]  PHYSICIAN:  Bryse Blanchette G. Renard Matter, MD   DATE OF BIRTH:  02/05/1961  DATE OF PROCEDURE: DATE OF DISCHARGE:                                PROGRESS NOTE   SUBJECTIVE:  This patient is free of pain this morning.  She was admitted through the emergency department after having developed what she describes as a severe sharp left-sided chest pain which awakened her from her sleep.  She has had no diaphoresis or nausea.  She was evaluated in the emergency room.  EKG did not show acute changes. Cardiac enzymes were negative.  A previous echocardiogram had been done as an outpatient which showed preserved systolic function.  OBJECTIVE:  GENERAL:  Alert female. VITAL SIGNS:  Blood pressure of 105/56, respirations 17, pulse 51, temp 97.3. HEENT:  Eyes, PERRLA.  TMs negative.  Oropharynx benign. NECK:  Supple.  No JVD or thyroid abnormalities. HEART:  Regular rhythm.  No murmurs. LUNGS:  Clear to P and A. ABDOMEN:  No palpable organs or masses.  No organomegaly. SKIN:  Warm and dry.  ASSESSMENT:  The patient was admitted with chest pain which is atypical, possibly related to anxiety.  She does have history of reflux and she did have hypokalemia which is being addressed, chronic pain syndrome, and chronic anxiety.     Mickell Birdwell G. Renard Matter, MD     AGM/MEDQ  D:  01/06/2013  T:  01/07/2013  Job:  [id number]

## 2013-01-23 ENCOUNTER — Other Ambulatory Visit (HOSPITAL_COMMUNITY): Payer: Self-pay | Admitting: Nurse Practitioner

## 2013-01-23 ENCOUNTER — Ambulatory Visit (HOSPITAL_COMMUNITY)
Admission: RE | Admit: 2013-01-23 | Discharge: 2013-01-23 | Disposition: A | Discharge status: Home / Self Care | Payer: Medicaid Other | Source: Ambulatory Visit | Attending: Nurse Practitioner | Admitting: Nurse Practitioner

## 2013-01-23 DIAGNOSIS — M542 Cervicalgia: Secondary | ICD-10-CM

## 2013-01-23 DIAGNOSIS — M898X9 Other specified disorders of bone, unspecified site: Secondary | ICD-10-CM | POA: Insufficient documentation

## 2013-07-11 ENCOUNTER — Emergency Department (HOSPITAL_COMMUNITY): Payer: Medicaid Other

## 2013-07-11 ENCOUNTER — Encounter (HOSPITAL_COMMUNITY): Payer: Self-pay | Admitting: Emergency Medicine

## 2013-07-11 ENCOUNTER — Emergency Department (HOSPITAL_COMMUNITY)
Admission: EM | Admit: 2013-07-11 | Discharge: 2013-07-11 | Disposition: A | Discharge status: Home / Self Care | Payer: Medicaid Other | Attending: Emergency Medicine | Admitting: Emergency Medicine

## 2013-07-11 DIAGNOSIS — Y9389 Activity, other specified: Secondary | ICD-10-CM | POA: Insufficient documentation

## 2013-07-11 DIAGNOSIS — Z8639 Personal history of other endocrine, nutritional and metabolic disease: Secondary | ICD-10-CM | POA: Insufficient documentation

## 2013-07-11 DIAGNOSIS — Z8711 Personal history of peptic ulcer disease: Secondary | ICD-10-CM | POA: Insufficient documentation

## 2013-07-11 DIAGNOSIS — S0990XA Unspecified injury of head, initial encounter: Secondary | ICD-10-CM | POA: Insufficient documentation

## 2013-07-11 DIAGNOSIS — R269 Unspecified abnormalities of gait and mobility: Secondary | ICD-10-CM | POA: Insufficient documentation

## 2013-07-11 DIAGNOSIS — R7989 Other specified abnormal findings of blood chemistry: Secondary | ICD-10-CM | POA: Insufficient documentation

## 2013-07-11 DIAGNOSIS — Z8541 Personal history of malignant neoplasm of cervix uteri: Secondary | ICD-10-CM | POA: Insufficient documentation

## 2013-07-11 DIAGNOSIS — S199XXA Unspecified injury of neck, initial encounter: Secondary | ICD-10-CM

## 2013-07-11 DIAGNOSIS — F3289 Other specified depressive episodes: Secondary | ICD-10-CM | POA: Insufficient documentation

## 2013-07-11 DIAGNOSIS — IMO0002 Reserved for concepts with insufficient information to code with codable children: Secondary | ICD-10-CM | POA: Insufficient documentation

## 2013-07-11 DIAGNOSIS — Z8719 Personal history of other diseases of the digestive system: Secondary | ICD-10-CM | POA: Insufficient documentation

## 2013-07-11 DIAGNOSIS — G894 Chronic pain syndrome: Secondary | ICD-10-CM | POA: Insufficient documentation

## 2013-07-11 DIAGNOSIS — G8929 Other chronic pain: Secondary | ICD-10-CM

## 2013-07-11 DIAGNOSIS — S0993XA Unspecified injury of face, initial encounter: Secondary | ICD-10-CM | POA: Insufficient documentation

## 2013-07-11 DIAGNOSIS — S5000XA Contusion of unspecified elbow, initial encounter: Secondary | ICD-10-CM | POA: Insufficient documentation

## 2013-07-11 DIAGNOSIS — W1809XA Striking against other object with subsequent fall, initial encounter: Secondary | ICD-10-CM | POA: Insufficient documentation

## 2013-07-11 DIAGNOSIS — S5001XA Contusion of right elbow, initial encounter: Secondary | ICD-10-CM

## 2013-07-11 DIAGNOSIS — Y929 Unspecified place or not applicable: Secondary | ICD-10-CM | POA: Insufficient documentation

## 2013-07-11 DIAGNOSIS — F17200 Nicotine dependence, unspecified, uncomplicated: Secondary | ICD-10-CM | POA: Insufficient documentation

## 2013-07-11 DIAGNOSIS — R42 Dizziness and giddiness: Secondary | ICD-10-CM | POA: Insufficient documentation

## 2013-07-11 DIAGNOSIS — R5381 Other malaise: Secondary | ICD-10-CM | POA: Insufficient documentation

## 2013-07-11 DIAGNOSIS — R945 Abnormal results of liver function studies: Secondary | ICD-10-CM

## 2013-07-11 DIAGNOSIS — F329 Major depressive disorder, single episode, unspecified: Secondary | ICD-10-CM | POA: Insufficient documentation

## 2013-07-11 DIAGNOSIS — Z8739 Personal history of other diseases of the musculoskeletal system and connective tissue: Secondary | ICD-10-CM | POA: Insufficient documentation

## 2013-07-11 DIAGNOSIS — R5383 Other fatigue: Secondary | ICD-10-CM

## 2013-07-11 DIAGNOSIS — Z79899 Other long term (current) drug therapy: Secondary | ICD-10-CM | POA: Insufficient documentation

## 2013-07-11 DIAGNOSIS — F411 Generalized anxiety disorder: Secondary | ICD-10-CM | POA: Insufficient documentation

## 2013-07-11 DIAGNOSIS — Z862 Personal history of diseases of the blood and blood-forming organs and certain disorders involving the immune mechanism: Secondary | ICD-10-CM | POA: Insufficient documentation

## 2013-07-11 HISTORY — DX: Chronic pain syndrome: G89.4

## 2013-07-11 LAB — RAPID URINE DRUG SCREEN, HOSP PERFORMED
AMPHETAMINES: NOT DETECTED
BARBITURATES: NOT DETECTED
BENZODIAZEPINES: POSITIVE — AB
Cocaine: NOT DETECTED
Opiates: NOT DETECTED
TETRAHYDROCANNABINOL: NOT DETECTED

## 2013-07-11 LAB — COMPREHENSIVE METABOLIC PANEL
ALBUMIN: 3.5 g/dL (ref 3.5–5.2)
ALT: 40 U/L — AB (ref 0–35)
AST: 76 U/L — AB (ref 0–37)
Alkaline Phosphatase: 84 U/L (ref 39–117)
BUN: 10 mg/dL (ref 6–23)
CALCIUM: 9 mg/dL (ref 8.4–10.5)
CO2: 31 mEq/L (ref 19–32)
Chloride: 99 mEq/L (ref 96–112)
Creatinine, Ser: 0.97 mg/dL (ref 0.50–1.10)
GFR calc Af Amer: 75 mL/min — ABNORMAL LOW (ref >90)
GFR calc non Af Amer: 65 mL/min — ABNORMAL LOW (ref >90)
Glucose, Bld: 84 mg/dL (ref 70–99)
POTASSIUM: 3.6 meq/L — AB (ref 3.7–5.3)
SODIUM: 141 meq/L (ref 137–147)
TOTAL PROTEIN: 6.8 g/dL (ref 6.0–8.3)
Total Bilirubin: 0.2 mg/dL — ABNORMAL LOW (ref 0.3–1.2)

## 2013-07-11 LAB — CBC WITH DIFFERENTIAL/PLATELET
BASOS ABS: 0 10*3/uL (ref 0.0–0.1)
Basophils Relative: 0 % (ref 0–1)
EOS ABS: 0.3 10*3/uL (ref 0.0–0.7)
EOS PCT: 3 % (ref 0–5)
HCT: 41.3 % (ref 36.0–46.0)
Hemoglobin: 13.8 g/dL (ref 12.0–15.0)
Lymphocytes Relative: 29 % (ref 12–46)
Lymphs Abs: 3.2 10*3/uL (ref 0.7–4.0)
MCH: 30.1 pg (ref 26.0–34.0)
MCHC: 33.4 g/dL (ref 30.0–36.0)
MCV: 90 fL (ref 78.0–100.0)
Monocytes Absolute: 0.8 10*3/uL (ref 0.1–1.0)
Monocytes Relative: 7 % (ref 3–12)
NEUTROS PCT: 61 % (ref 43–77)
Neutro Abs: 6.8 10*3/uL (ref 1.7–7.7)
PLATELETS: 265 10*3/uL (ref 150–400)
RBC: 4.59 MIL/uL (ref 3.87–5.11)
RDW: 15.5 % (ref 11.5–15.5)
WBC: 11.2 10*3/uL — AB (ref 4.0–10.5)

## 2013-07-11 LAB — URINALYSIS, ROUTINE W REFLEX MICROSCOPIC
Bilirubin Urine: NEGATIVE
Glucose, UA: NEGATIVE mg/dL
HGB URINE DIPSTICK: NEGATIVE
Ketones, ur: NEGATIVE mg/dL
Leukocytes, UA: NEGATIVE
Nitrite: NEGATIVE
PH: 5.5 (ref 5.0–8.0)
Protein, ur: NEGATIVE mg/dL
SPECIFIC GRAVITY, URINE: 1.011 (ref 1.005–1.030)
UROBILINOGEN UA: 0.2 mg/dL (ref 0.0–1.0)

## 2013-07-11 LAB — I-STAT TROPONIN, ED: Troponin i, poc: 0 ng/mL (ref 0.00–0.08)

## 2013-07-11 LAB — ETHANOL

## 2013-07-11 LAB — SALICYLATE LEVEL

## 2013-07-11 LAB — ACETAMINOPHEN LEVEL

## 2013-07-11 MED ORDER — SODIUM CHLORIDE 0.9 % IV BOLUS (SEPSIS)
1000.0000 mL | Freq: Once | INTRAVENOUS | Status: AC
  Administered 2013-07-11: 1000 mL via INTRAVENOUS

## 2013-07-11 NOTE — ED Notes (Signed)
Patient walked to and from restroom without difficulty.
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
## 2013-07-11 NOTE — ED Notes (Signed)
Patient transported to X-ray 
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
## 2013-07-11 NOTE — ED Notes (Signed)
Requested urine sample from pt. Before going to Xray. Pt sts she would go when she came back. Went to check on pt. To take to restroom and pt. sts "I already went. I didn't know I needed a urine sample."

## 2013-07-11 NOTE — Discharge Instructions (Signed)
Follow up with primary care doctor and your neurologist regarding your pain. Please avoid taking too much pain medications. Return if worsening.   Chronic Pain Chronic pain can be defined as pain that is off and on and lasts for 3 6 months or longer. Many things cause chronic pain, which can make it difficult to make a diagnosis. There are many treatment options available for chronic pain. However, finding a treatment that works well for you may require trying various approaches until the right one is found. Many people benefit from a combination of two or more types of treatment to control their pain. SYMPTOMS  Chronic pain can occur anywhere in the body and can range from mild to very severe. Some types of chronic pain include:  Headache.  Low back pain.  Cancer pain.  Arthritis pain.  Neurogenic pain. This is pain resulting from damage to nerves. People with chronic pain may also have other symptoms such as:  Depression.  Anger.  Insomnia.  Anxiety. DIAGNOSIS  Your health care provider will help diagnose your condition over time. In many cases, the initial focus will be on excluding possible conditions that could be causing the pain. Depending on your symptoms, your health care provider may order tests to diagnose your condition. Some of these tests may include:   Blood tests.   CT scan.   MRI.   X-rays.   Ultrasounds.   Nerve conduction studies.  You may need to see a specialist.  TREATMENT  Finding treatment that works well may take time. You may be referred to a pain specialist. He or she may prescribe medicine or therapies, such as:   Mindful meditation or yoga.  Shots (injections) of numbing or pain-relieving medicines into the spine or area of pain.  Local electrical stimulation.  Acupuncture.   Massage therapy.   Aroma, color, light, or sound therapy.   Biofeedback.   Working with a physical therapist to keep from getting stiff.    Regular, gentle exercise.   Cognitive or behavioral therapy.   Group support.  Sometimes, surgery may be recommended.  HOME CARE INSTRUCTIONS   Take all medicines as directed by your health care provider.   Lessen stress in your life by relaxing and doing things such as listening to calming music.   Exercise or be active as directed by your health care provider.   Eat a healthy diet and include things such as vegetables, fruits, fish, and lean meats in your diet.   Keep all follow-up appointments with your health care provider.   Attend a support group with others suffering from chronic pain. SEEK MEDICAL CARE IF:   Your pain gets worse.   You develop a new pain that was not there before.   You cannot tolerate medicines given to you by your health care provider.   You have new symptoms since your last visit with your health care provider.  SEEK IMMEDIATE MEDICAL CARE IF:   You feel weak.   You have decreased sensation or numbness.   You lose control of bowel or bladder function.   Your pain suddenly gets much worse.   You develop shaking.  You develop chills.  You develop confusion.  You develop chest pain.  You develop shortness of breath.  MAKE SURE YOU:  Understand these instructions.  Will watch your condition.  Will get help right away if you are not doing well or get worse. Document Released: 01/02/2002 Document Revised: 12/13/2012 Document Reviewed: 10/06/2012 [employer] Patient  Information ©2014 [employer]. ° °

## 2013-07-12 LAB — URINE CULTURE

## 2013-07-13 NOTE — ED Provider Notes (Signed)
Medical screening examination/treatment/procedure(s) were performed by non-physician practitioner and as supervising physician I was immediately available for consultation/collaboration.   EKG Interpretation   Date/Time:  Wednesday July 11 2013 15:01:13 EDT Ventricular Rate:  91 PR Interval:  155 QRS Duration: 93 QT Interval:  378 QTC Calculation: 465 R Axis:   95 Text Interpretation:  Sinus rhythm Baseline wander Artifact Multiple  ventricular premature complexes T wave abnormality Anterior leads When  compared with ECG of 01/07/2013 No significant change was found Confirmed  by [facility]  MD, Bettie Swavely ([id number]) on 07/11/2013 4:29:27 PM        Alfonzo Feller, DO 07/13/13 1433

## 2013-08-03 ENCOUNTER — Emergency Department (HOSPITAL_COMMUNITY): Payer: Medicaid Other

## 2013-08-03 ENCOUNTER — Inpatient Hospital Stay (HOSPITAL_COMMUNITY)
Admission: EM | Admit: 2013-08-03 | Discharge: 2013-08-10 | DRG: 574 | Disposition: A | Discharge status: Home / Self Care | Payer: Medicaid Other | Attending: Orthopedic Surgery | Admitting: Orthopedic Surgery

## 2013-08-03 ENCOUNTER — Encounter (HOSPITAL_COMMUNITY): Payer: Self-pay | Admitting: Emergency Medicine

## 2013-08-03 DIAGNOSIS — L03113 Cellulitis of right upper limb: Secondary | ICD-10-CM

## 2013-08-03 DIAGNOSIS — W19XXXA Unspecified fall, initial encounter: Secondary | ICD-10-CM | POA: Diagnosis present

## 2013-08-03 DIAGNOSIS — M545 Low back pain, unspecified: Secondary | ICD-10-CM

## 2013-08-03 DIAGNOSIS — G8929 Other chronic pain: Secondary | ICD-10-CM | POA: Diagnosis present

## 2013-08-03 DIAGNOSIS — F411 Generalized anxiety disorder: Secondary | ICD-10-CM | POA: Diagnosis present

## 2013-08-03 DIAGNOSIS — IMO0002 Reserved for concepts with insufficient information to code with codable children: Principal | ICD-10-CM | POA: Diagnosis present

## 2013-08-03 DIAGNOSIS — T79A19A Traumatic compartment syndrome of unspecified upper extremity, initial encounter: Secondary | ICD-10-CM | POA: Diagnosis present

## 2013-08-03 DIAGNOSIS — L02519 Cutaneous abscess of unspecified hand: Secondary | ICD-10-CM | POA: Diagnosis present

## 2013-08-03 DIAGNOSIS — E876 Hypokalemia: Secondary | ICD-10-CM | POA: Diagnosis present

## 2013-08-03 DIAGNOSIS — G894 Chronic pain syndrome: Secondary | ICD-10-CM | POA: Diagnosis present

## 2013-08-03 DIAGNOSIS — L03119 Cellulitis of unspecified part of limb: Secondary | ICD-10-CM

## 2013-08-03 DIAGNOSIS — M1990 Unspecified osteoarthritis, unspecified site: Secondary | ICD-10-CM | POA: Diagnosis present

## 2013-08-03 DIAGNOSIS — M79609 Pain in unspecified limb: Secondary | ICD-10-CM

## 2013-08-03 DIAGNOSIS — F329 Major depressive disorder, single episode, unspecified: Secondary | ICD-10-CM

## 2013-08-03 DIAGNOSIS — F1320 Sedative, hypnotic or anxiolytic dependence, uncomplicated: Secondary | ICD-10-CM | POA: Diagnosis present

## 2013-08-03 DIAGNOSIS — M542 Cervicalgia: Secondary | ICD-10-CM

## 2013-08-03 DIAGNOSIS — K279 Peptic ulcer, site unspecified, unspecified as acute or chronic, without hemorrhage or perforation: Secondary | ICD-10-CM | POA: Diagnosis present

## 2013-08-03 DIAGNOSIS — I10 Essential (primary) hypertension: Secondary | ICD-10-CM | POA: Diagnosis present

## 2013-08-03 DIAGNOSIS — M129 Arthropathy, unspecified: Secondary | ICD-10-CM

## 2013-08-03 DIAGNOSIS — F1120 Opioid dependence, uncomplicated: Secondary | ICD-10-CM | POA: Diagnosis present

## 2013-08-03 DIAGNOSIS — F32A Depression, unspecified: Secondary | ICD-10-CM

## 2013-08-03 DIAGNOSIS — F319 Bipolar disorder, unspecified: Secondary | ICD-10-CM | POA: Diagnosis present

## 2013-08-03 DIAGNOSIS — F17200 Nicotine dependence, unspecified, uncomplicated: Secondary | ICD-10-CM | POA: Diagnosis present

## 2013-08-03 DIAGNOSIS — T798XXA Other early complications of trauma, initial encounter: Secondary | ICD-10-CM | POA: Diagnosis present

## 2013-08-03 DIAGNOSIS — M109 Gout, unspecified: Secondary | ICD-10-CM

## 2013-08-03 LAB — CBC WITH DIFFERENTIAL/PLATELET
Basophils Absolute: 0 10*3/uL (ref 0.0–0.1)
Basophils Relative: 0 % (ref 0–1)
Eosinophils Absolute: 0.3 10*3/uL (ref 0.0–0.7)
Eosinophils Relative: 3 % (ref 0–5)
HCT: 40.4 % (ref 36.0–46.0)
Hemoglobin: 13.8 g/dL (ref 12.0–15.0)
Lymphocytes Relative: 18 % (ref 12–46)
Lymphs Abs: 2.2 10*3/uL (ref 0.7–4.0)
MCH: 30.2 pg (ref 26.0–34.0)
MCHC: 34.2 g/dL (ref 30.0–36.0)
MCV: 88.4 fL (ref 78.0–100.0)
Monocytes Absolute: 1 10*3/uL (ref 0.1–1.0)
Monocytes Relative: 8 % (ref 3–12)
Neutro Abs: 8.5 10*3/uL — ABNORMAL HIGH (ref 1.7–7.7)
Neutrophils Relative %: 71 % (ref 43–77)
Platelets: 256 10*3/uL (ref 150–400)
RBC: 4.57 MIL/uL (ref 3.87–5.11)
RDW: 14.8 % (ref 11.5–15.5)
WBC: 12 10*3/uL — ABNORMAL HIGH (ref 4.0–10.5)

## 2013-08-03 LAB — BASIC METABOLIC PANEL
BUN: 14 mg/dL (ref 6–23)
CO2: 27 mEq/L (ref 19–32)
Calcium: 8.8 mg/dL (ref 8.4–10.5)
Chloride: 96 mEq/L (ref 96–112)
Creatinine, Ser: 0.77 mg/dL (ref 0.50–1.10)
GFR calc Af Amer: >90 mL/min (ref >90)
GFR calc non Af Amer: >90 mL/min (ref >90)
Glucose, Bld: 127 mg/dL — ABNORMAL HIGH (ref 70–99)
Potassium: 3.1 mEq/L — ABNORMAL LOW (ref 3.7–5.3)
Sodium: 135 mEq/L — ABNORMAL LOW (ref 137–147)

## 2013-08-03 LAB — LACTIC ACID, PLASMA: Lactic Acid, Venous: 0.9 mmol/L (ref 0.5–2.2)

## 2013-08-03 MED ORDER — ONDANSETRON HCL 4 MG/2ML IJ SOLN
4.0000 mg | Freq: Once | INTRAMUSCULAR | Status: DC
  Filled 2013-08-03: qty 2

## 2013-08-03 MED ORDER — HYDROMORPHONE HCL PF 1 MG/ML IJ SOLN
1.0000 mg | Freq: Once | INTRAMUSCULAR | Status: AC
  Administered 2013-08-03: 1 mg via INTRAVENOUS
  Filled 2013-08-03: qty 1

## 2013-08-03 MED ORDER — PIPERACILLIN-TAZOBACTAM 3.375 G IVPB
3.3750 g | Freq: Three times a day (TID) | INTRAVENOUS | Status: DC
  Administered 2013-08-04 – 2013-08-10 (×20): 3.375 g via INTRAVENOUS
  Filled 2013-08-03 (×24): qty 50

## 2013-08-03 MED ORDER — ONDANSETRON HCL 4 MG/2ML IJ SOLN
4.0000 mg | Freq: Once | INTRAMUSCULAR | Status: AC
  Administered 2013-08-03: 4 mg via INTRAVENOUS

## 2013-08-03 MED ORDER — VANCOMYCIN HCL IN DEXTROSE 1-5 GM/200ML-% IV SOLN
1000.0000 mg | Freq: Two times a day (BID) | INTRAVENOUS | Status: DC
  Administered 2013-08-03 – 2013-08-10 (×13): 1000 mg via INTRAVENOUS
  Filled 2013-08-03 (×17): qty 200

## 2013-08-03 MED ORDER — PIPERACILLIN-TAZOBACTAM 3.375 G IVPB 30 MIN
3.3750 g | Freq: Once | INTRAVENOUS | Status: DC
  Filled 2013-08-03: qty 50

## 2013-08-03 MED ORDER — VANCOMYCIN HCL IN DEXTROSE 1-5 GM/200ML-% IV SOLN
1000.0000 mg | Freq: Once | INTRAVENOUS | Status: DC
  Filled 2013-08-03: qty 200

## 2013-08-03 NOTE — ED Notes (Signed)
[place] [address] 2 days ago also

## 2013-08-03 NOTE — Progress Notes (Signed)
ANTIBIOTIC CONSULT NOTE - INITIAL  Pharmacy Consult for Vancomycin & Zosyn Indication: Cellulitis  Allergies  Allergen Reactions  . Vicodin [Hydrocodone-Acetaminophen] Itching    Patient Measurements: Height: 5\' 4"  (162.6 cm) Weight: 180 lb (81.647 kg) IBW/kg (Calculated) : 54.7 Adjusted Body Weight:   Vital Signs: Temp: 98.5 F (36.9 C) (04/10 1946) Temp src: Oral (04/10 1946) BP: 93/62 mmHg (04/10 1946) Pulse Rate: 101 (04/10 1946) Intake/Output from previous day:   Intake/Output from this shift:    Labs:  Recent Labs  08/03/13 2130  WBC 12.0*  HGB 13.8  PLT 256  CREATININE 0.77   Estimated Creatinine Clearance: 83.1 ml/min (by C-G formula based on Cr of 0.77). No results found for this basename: Letta Median, VANCORANDOM, GENTTROUGH, GENTPEAK, GENTRANDOM, TOBRATROUGH, TOBRAPEAK, TOBRARND, AMIKACINPEAK, AMIKACINTROU, AMIKACIN,  in the last 72 hours   Microbiology: Recent Results (from the past 720 hour(s))  URINE CULTURE     Status: None   Collection Time    07/11/13  6:15 PM      Result Value Ref Range Status   Specimen Description URINE, CLEAN CATCH   Final   Special Requests NONE   Final   Culture  Setup Time     Final   Value: 07/12/2013 01:53     Performed at [facility]     Final   Value: NO GROWTH     Performed at [facility]   Culture     Final   Value: NO GROWTH     Performed at [facility]   Report Status 07/12/2013 FINAL   Final    Medical History: Past Medical History  Diagnosis Date  . Allergic rhinitis   . Anxiety and depression   . Osteoarthritis   . PUD (peptic ulcer disease)   . Gout   . DJD (degenerative joint disease) of lumbar spine   . Chronic back pain   . GI bleed   . Chronic pain syndrome   . Cervical cancer     Medications:  Scheduled:   Assessment: Vancomycin & Zosyn Cellulitis CrCl 83.1 ml/min   Goal of Therapy:  Vancomycin trough level 10-15  mcg/ml  Plan:  Vancomycin 1 GM IV every 12 hours Zosyn 3.375 GM IV every 8 hours, infused over 4 hours Vancomycin trough at steady state Labs per protocol  Angeleigh Chiasson [employer] 08/03/2013,10:22 PM

## 2013-08-03 NOTE — ED Notes (Addendum)
Rt hand, forearm swollen, red with blisters. Pt has a ring  On rt ring finger.     Good radial pulse.

## 2013-08-03 NOTE — ED Provider Notes (Signed)
CSN: [id number]     Arrival date & time 08/03/13  1936 History  This chart was scribed for Jessica Manifold, MD by Roe Coombs, ED Scribe. The patient was seen in room APA18/APA18. Patient's care was started at 9:03 PM.  Chief Complaint  Patient presents with  . Cellulitis    Patient is a 53 y.o. female presenting with extremity pain. The history is provided by the patient. No language interpreter was used.  Extremity Pain This is a new problem. The current episode started 12 to 24 hours ago. The problem occurs constantly. The problem has not changed since onset.She has tried nothing for the symptoms.    HPI Comments: [name] is a 53 y.o. female who presents to the Emergency Department complaining of constant moderate right forearm pain that began 19 hours ago. She also has swelling and erythema to her right hand, wrist and forearm that started at the same time. Patient states that she fell two days ago while walking on slick hardwood floors in her home landing on her right arm. She is not sure whether she scratched her skin or if there were any skin tears on her right arm as a result of the fall. She denies any recent insect bites or any other injuries. There is no fever or chills. Patient has a medical history of chronic back pain, cervical cancer, PUD, anxiety and depression. She has never been diagnosed with diabetes. She denies any illicit drug use. She reports an allergy to Vicodin (itching).   Past Medical History  Diagnosis Date  . Allergic rhinitis   . Anxiety and depression   . Osteoarthritis   . PUD (peptic ulcer disease)   . Gout   . DJD (degenerative joint disease) of lumbar spine   . Chronic back pain   . GI bleed   . Chronic pain syndrome   . Cervical cancer    Past Surgical History  Procedure Laterality Date  . Total abdominal hysterectomy    . Cervival cone    . Tubal ligation    . Appendectomy    . Throat surgery     Family History  Problem Relation Age of  Onset  . Coronary artery disease Father   . Hypertension Father   . Diabetes type II Mother   . Alcohol abuse Mother    History  Substance Use Topics  . Smoking status: Current Every Day Smoker -- 2.00 packs/day for 40 years    Types: Cigarettes  . Smokeless tobacco: Never Used  . Alcohol Use: No   OB History   Grav Para Term Preterm Abortions TAB SAB Ect Mult Living                 Review of Systems  Constitutional: Negative for fever and chills.  Musculoskeletal: Positive for myalgias.       Positive for right upper extremity swelling.  Skin:       Positive for erythema to right upper extremity.  All other systems reviewed and are negative.   Allergies  Vicodin  Home Medications   Current Outpatient Rx  Name  Route  Sig  Dispense  Refill  . ALPRAZolam (XANAX) 1 MG tablet   Oral   Take 1 mg by mouth 2 (two) times daily as needed for anxiety.          . cyclobenzaprine (FLEXERIL) 10 MG tablet   Oral   Take 10 mg by mouth 3 (three) times daily as needed. For  muscle spasms          . CYMBALTA 60 MG capsule   Oral   Take 60 mg by mouth at bedtime.         . nitroGLYCERIN (NITROSTAT) 0.3 MG SL tablet   Sublingual   Place 0.3 mg under the tongue every 5 (five) minutes as needed for chest pain.         Marland Kitchen omeprazole (PRILOSEC) 40 MG capsule   Oral   Take 40 mg by mouth daily.          Marland Kitchen oxycodone (ROXICODONE) 30 MG immediate release tablet   Oral   Take 30 mg by mouth 4 (four) times daily.           Marland Kitchen triamterene-hydrochlorothiazide (MAXZIDE-25) 37.5-25 MG per tablet   Oral   Take 1 tablet by mouth every morning.           Triage Vitals: BP 93/62  Pulse 101  Temp(Src) 98.5 F (36.9 C) (Oral)  Resp 20  Ht 5\' 4"  (1.626 m)  Wt 180 lb (81.647 kg)  BMI 30.88 kg/m2  SpO2 95%  LMP 09/16/2000 Physical Exam  Nursing note and vitals reviewed. Constitutional: She appears well-developed and well-nourished. No distress.  HENT:  Head:  Normocephalic and atraumatic.  Eyes: Conjunctivae are normal. Right eye exhibits no discharge. Left eye exhibits no discharge.  Neck: Neck supple.  Cardiovascular: Normal rate, regular rhythm and normal heart sounds.  Exam reveals no gallop and no friction rub.   No murmur heard. Pulmonary/Chest: Effort normal and breath sounds normal. No respiratory distress.  Abdominal: Soft. She exhibits no distension. There is no tenderness.  Musculoskeletal: She exhibits no edema and no tenderness.  R hand exam consistent with tenosynovitis. Diffuse symmetric swelling of all digits. Tense. Erythematous. Holding digits in flexed position. Passive extension with marked increased in pain. Erythema, swelling and tenderness includes entire hand. Extends past wrist though to include essentially entire forearm. Along volar aspect of mid to distal forearm there are multiple vesicles and blisters with serous appearing fluid. Some intact and some ruptured. Palpable radial pulse. Sensation intact at finger tips and cap refill ok.  Fingertips warm to touch. ROM both actively and passively at elbow w/o apparent pain.   Neurological: She is alert.  Skin: Skin is warm and dry.  Psychiatric: She has a normal mood and affect. Her behavior is normal. Thought content normal.    ED Course  Procedures (including critical care time) DIAGNOSTIC STUDIES: Oxygen Saturation is 95% on room air, adequate by my interpretation.    COORDINATION OF CARE: 9:08 PM- Patient informed of current plan for treatment and evaluation and agrees with plan at this time.    Labs Review Labs Reviewed - No data to display   Imaging Review Dg Forearm Right  08/03/2013   CLINICAL DATA:  CELLULITIS  EXAM: RIGHT WRIST - COMPLETE 3+ VIEW; RIGHT HAND - COMPLETE 3+ VIEW; RIGHT FOREARM - 2 VIEW  COMPARISON:  DG ELBOW COMPLETE*R* dated 07/11/2013  FINDINGS: There is no evidence of fracture or dislocation. There is no evidence of arthropathy or other focal  bone abnormality. Wrist and hand soft tissue swelling. Anterior forearm skin irregularity without subcutaneous gas or radiopaque foreign bodies.  IMPRESSION: No fracture deformity,, dislocation or destructive bony lesions.  Soft tissue swelling of the anterior forearm skin irregularity, no subcutaneous gas or radiopaque foreign bodies.   Electronically Signed   By: Elon Alas   On: 08/03/2013 20:26  Dg Wrist Complete Right  08/03/2013   CLINICAL DATA:  CELLULITIS  EXAM: RIGHT WRIST - COMPLETE 3+ VIEW; RIGHT HAND - COMPLETE 3+ VIEW; RIGHT FOREARM - 2 VIEW  COMPARISON:  DG ELBOW COMPLETE*R* dated 07/11/2013  FINDINGS: There is no evidence of fracture or dislocation. There is no evidence of arthropathy or other focal bone abnormality. Wrist and hand soft tissue swelling. Anterior forearm skin irregularity without subcutaneous gas or radiopaque foreign bodies.  IMPRESSION: No fracture deformity,, dislocation or destructive bony lesions.  Soft tissue swelling of the anterior forearm skin irregularity, no subcutaneous gas or radiopaque foreign bodies.   Electronically Signed   By: Elon Alas   On: 08/03/2013 20:26   Dg Hand Complete Right  08/03/2013   CLINICAL DATA:  CELLULITIS  EXAM: RIGHT WRIST - COMPLETE 3+ VIEW; RIGHT HAND - COMPLETE 3+ VIEW; RIGHT FOREARM - 2 VIEW  COMPARISON:  DG ELBOW COMPLETE*R* dated 07/11/2013  FINDINGS: There is no evidence of fracture or dislocation. There is no evidence of arthropathy or other focal bone abnormality. Wrist and hand soft tissue swelling. Anterior forearm skin irregularity without subcutaneous gas or radiopaque foreign bodies.  IMPRESSION: No fracture deformity,, dislocation or destructive bony lesions.  Soft tissue swelling of the anterior forearm skin irregularity, no subcutaneous gas or radiopaque foreign bodies.   Electronically Signed   By: Elon Alas   On: 08/03/2013 20:26     EKG Interpretation None      MDM   Final diagnoses:   Cellulitis of hand, right    52yF with at least very severe cellulitis of R hand/forearm. Clinical concern for tenosynovitis. Imaging w/o evidence of fx. No subcutaneous gas. Unfortunately MR not available at this time. Described mechanism doesn't necessarily fit clinical picture. Pt asked several times though and story hasn't varied. Denies IV drug use. Normotensive. Mild tachycardia. Afebrile. Pt needs admitted. I think pt needs either prompt evaluation by either hand surgeon or MR. Abx ordered. Basic labs. Anticipate transfer to [facility]   I personally preformed the services scribed in my presence. The recorded information has been reviewed is accurate. Jessica Manifold, MD.    Jessica Manifold, MD 08/03/13 [phone]

## 2013-08-04 ENCOUNTER — Other Ambulatory Visit: Payer: Self-pay

## 2013-08-04 ENCOUNTER — Emergency Department (HOSPITAL_COMMUNITY): Payer: Medicaid Other | Admitting: Anesthesiology

## 2013-08-04 ENCOUNTER — Encounter (HOSPITAL_COMMUNITY)
Admission: EM | Disposition: A | Discharge status: Home / Self Care | Payer: Self-pay | Source: Home / Self Care | Attending: Orthopedic Surgery

## 2013-08-04 ENCOUNTER — Encounter (HOSPITAL_COMMUNITY): Payer: Medicaid Other | Admitting: Anesthesiology

## 2013-08-04 DIAGNOSIS — E876 Hypokalemia: Secondary | ICD-10-CM | POA: Diagnosis not present

## 2013-08-04 DIAGNOSIS — I10 Essential (primary) hypertension: Secondary | ICD-10-CM

## 2013-08-04 DIAGNOSIS — F411 Generalized anxiety disorder: Secondary | ICD-10-CM

## 2013-08-04 DIAGNOSIS — M545 Low back pain, unspecified: Secondary | ICD-10-CM

## 2013-08-04 DIAGNOSIS — F329 Major depressive disorder, single episode, unspecified: Secondary | ICD-10-CM

## 2013-08-04 DIAGNOSIS — L02519 Cutaneous abscess of unspecified hand: Secondary | ICD-10-CM | POA: Diagnosis not present

## 2013-08-04 DIAGNOSIS — M1990 Unspecified osteoarthritis, unspecified site: Secondary | ICD-10-CM

## 2013-08-04 DIAGNOSIS — T79A19A Traumatic compartment syndrome of unspecified upper extremity, initial encounter: Secondary | ICD-10-CM | POA: Diagnosis present

## 2013-08-04 DIAGNOSIS — K279 Peptic ulcer, site unspecified, unspecified as acute or chronic, without hemorrhage or perforation: Secondary | ICD-10-CM

## 2013-08-04 DIAGNOSIS — F3289 Other specified depressive episodes: Secondary | ICD-10-CM

## 2013-08-04 DIAGNOSIS — L03119 Cellulitis of unspecified part of limb: Secondary | ICD-10-CM

## 2013-08-04 DIAGNOSIS — F17200 Nicotine dependence, unspecified, uncomplicated: Secondary | ICD-10-CM

## 2013-08-04 DIAGNOSIS — G8929 Other chronic pain: Secondary | ICD-10-CM

## 2013-08-04 DIAGNOSIS — F1320 Sedative, hypnotic or anxiolytic dependence, uncomplicated: Secondary | ICD-10-CM

## 2013-08-04 DIAGNOSIS — M542 Cervicalgia: Secondary | ICD-10-CM

## 2013-08-04 DIAGNOSIS — L03113 Cellulitis of right upper limb: Secondary | ICD-10-CM

## 2013-08-04 DIAGNOSIS — F319 Bipolar disorder, unspecified: Secondary | ICD-10-CM

## 2013-08-04 DIAGNOSIS — IMO0002 Reserved for concepts with insufficient information to code with codable children: Secondary | ICD-10-CM | POA: Diagnosis not present

## 2013-08-04 DIAGNOSIS — M129 Arthropathy, unspecified: Secondary | ICD-10-CM

## 2013-08-04 HISTORY — PX: FASCIOTOMY: SHX132

## 2013-08-04 HISTORY — PX: I & D EXTREMITY: SHX5045

## 2013-08-04 HISTORY — PX: APPLICATION OF WOUND VAC: SHX5189

## 2013-08-04 SURGERY — IRRIGATION AND DEBRIDEMENT EXTREMITY
Anesthesia: General | Site: Arm Lower | Laterality: Right

## 2013-08-04 MED ORDER — ALPRAZOLAM 0.5 MG PO TABS
1.0000 mg | ORAL_TABLET | Freq: Two times a day (BID) | ORAL | Status: DC | PRN
  Administered 2013-08-05 – 2013-08-10 (×9): 1 mg via ORAL
  Filled 2013-08-04 (×9): qty 2

## 2013-08-04 MED ORDER — PHENYLEPHRINE HCL 10 MG/ML IJ SOLN
INTRAMUSCULAR | Status: DC | PRN
  Administered 2013-08-04 (×2): 80 ug via INTRAVENOUS

## 2013-08-04 MED ORDER — LIDOCAINE HCL (CARDIAC) 20 MG/ML IV SOLN
INTRAVENOUS | Status: AC
  Filled 2013-08-04: qty 5

## 2013-08-04 MED ORDER — TRIAMTERENE-HCTZ 37.5-25 MG PO TABS
1.0000 | ORAL_TABLET | Freq: Every morning | ORAL | Status: DC
  Administered 2013-08-04 – 2013-08-10 (×4): 1 via ORAL
  Filled 2013-08-04 (×8): qty 1

## 2013-08-04 MED ORDER — MIDAZOLAM HCL 2 MG/2ML IJ SOLN
INTRAMUSCULAR | Status: DC | PRN
  Administered 2013-08-04: 2 mg via INTRAVENOUS

## 2013-08-04 MED ORDER — OXYCODONE HCL 5 MG PO TABS
5.0000 mg | ORAL_TABLET | ORAL | Status: DC | PRN
  Administered 2013-08-04 – 2013-08-05 (×5): 10 mg via ORAL
  Administered 2013-08-05: 5 mg via ORAL
  Administered 2013-08-05 – 2013-08-07 (×8): 10 mg via ORAL
  Filled 2013-08-04 (×12): qty 2
  Filled 2013-08-04: qty 1
  Filled 2013-08-04: qty 2

## 2013-08-04 MED ORDER — PROPOFOL 10 MG/ML IV BOLUS
INTRAVENOUS | Status: DC | PRN
  Administered 2013-08-04: 150 mg via INTRAVENOUS

## 2013-08-04 MED ORDER — MEPERIDINE HCL 25 MG/ML IJ SOLN: 6.2500 mg | INTRAMUSCULAR | Status: DC | PRN

## 2013-08-04 MED ORDER — SODIUM CHLORIDE 0.9 % IR SOLN
Status: DC | PRN
  Administered 2013-08-04: 3000 mL

## 2013-08-04 MED ORDER — MIDAZOLAM HCL 2 MG/2ML IJ SOLN
INTRAMUSCULAR | Status: AC
  Filled 2013-08-04: qty 2

## 2013-08-04 MED ORDER — PANTOPRAZOLE SODIUM 40 MG PO TBEC
40.0000 mg | DELAYED_RELEASE_TABLET | Freq: Every day | ORAL | Status: DC
  Administered 2013-08-04 – 2013-08-10 (×6): 40 mg via ORAL
  Filled 2013-08-04 (×6): qty 1

## 2013-08-04 MED ORDER — VANCOMYCIN HCL IN DEXTROSE 1-5 GM/200ML-% IV SOLN
1000.0000 mg | INTRAVENOUS | Status: AC
  Administered 2013-08-05: 1000 mg via INTRAVENOUS
  Filled 2013-08-04: qty 200

## 2013-08-04 MED ORDER — POTASSIUM CHLORIDE CRYS ER 20 MEQ PO TBCR
40.0000 meq | EXTENDED_RELEASE_TABLET | Freq: Once | ORAL | Status: AC
  Administered 2013-08-04: 40 meq via ORAL
  Filled 2013-08-04 (×3): qty 2

## 2013-08-04 MED ORDER — SUCCINYLCHOLINE CHLORIDE 20 MG/ML IJ SOLN
INTRAMUSCULAR | Status: DC | PRN
  Administered 2013-08-04: 100 mg via INTRAVENOUS

## 2013-08-04 MED ORDER — HYDROMORPHONE HCL PF 1 MG/ML IJ SOLN
INTRAMUSCULAR | Status: AC
  Filled 2013-08-04: qty 1

## 2013-08-04 MED ORDER — HYDROMORPHONE HCL PF 1 MG/ML IJ SOLN
0.5000 mg | INTRAMUSCULAR | Status: DC | PRN
  Administered 2013-08-04 – 2013-08-09 (×8): 1 mg via INTRAVENOUS
  Filled 2013-08-04 (×8): qty 1

## 2013-08-04 MED ORDER — HYDROMORPHONE HCL PF 1 MG/ML IJ SOLN
0.2500 mg | INTRAMUSCULAR | Status: DC | PRN
  Administered 2013-08-04: 0.5 mg via INTRAVENOUS

## 2013-08-04 MED ORDER — PROMETHAZINE HCL 25 MG/ML IJ SOLN: 6.2500 mg | INTRAMUSCULAR | Status: DC | PRN

## 2013-08-04 MED ORDER — CHLORHEXIDINE GLUCONATE 4 % EX LIQD
60.0000 mL | Freq: Once | CUTANEOUS | Status: AC
  Administered 2013-08-05: 4 via TOPICAL
  Filled 2013-08-04: qty 60

## 2013-08-04 MED ORDER — FENTANYL CITRATE 0.05 MG/ML IJ SOLN
INTRAMUSCULAR | Status: DC | PRN
  Administered 2013-08-04: 50 ug via INTRAVENOUS
  Administered 2013-08-04: 150 ug via INTRAVENOUS
  Administered 2013-08-04 (×2): 50 ug via INTRAVENOUS

## 2013-08-04 MED ORDER — PROMETHAZINE HCL 25 MG RE SUPP
12.5000 mg | Freq: Four times a day (QID) | RECTAL | Status: DC | PRN
  Filled 2013-08-04: qty 1

## 2013-08-04 MED ORDER — LACTATED RINGERS IV SOLN
INTRAVENOUS | Status: DC
  Administered 2013-08-09: 16:00:00 via INTRAVENOUS

## 2013-08-04 MED ORDER — NITROGLYCERIN 0.3 MG SL SUBL: 0.3000 mg | SUBLINGUAL_TABLET | SUBLINGUAL | Status: DC | PRN

## 2013-08-04 MED ORDER — BACITRACIN-NEOMYCIN-POLYMYXIN 400-5-5000 EX OINT
TOPICAL_OINTMENT | CUTANEOUS | Status: AC
  Filled 2013-08-04: qty 1

## 2013-08-04 MED ORDER — LACTATED RINGERS IV SOLN
INTRAVENOUS | Status: DC | PRN
  Administered 2013-08-04 (×2): via INTRAVENOUS

## 2013-08-04 MED ORDER — ONDANSETRON HCL 4 MG PO TABS
4.0000 mg | ORAL_TABLET | Freq: Four times a day (QID) | ORAL | Status: DC | PRN
  Filled 2013-08-04: qty 1

## 2013-08-04 MED ORDER — LIDOCAINE HCL (CARDIAC) 20 MG/ML IV SOLN
INTRAVENOUS | Status: DC | PRN
  Administered 2013-08-04: 100 mg via INTRAVENOUS

## 2013-08-04 MED ORDER — DULOXETINE HCL 60 MG PO CPEP
60.0000 mg | ORAL_CAPSULE | Freq: Every day | ORAL | Status: DC
  Administered 2013-08-04 – 2013-08-09 (×6): 60 mg via ORAL
  Filled 2013-08-04 (×7): qty 1

## 2013-08-04 MED ORDER — VITAMIN C 500 MG PO TABS
1000.0000 mg | ORAL_TABLET | Freq: Every day | ORAL | Status: DC
  Administered 2013-08-06 – 2013-08-10 (×5): 1000 mg via ORAL
  Filled 2013-08-04 (×9): qty 2

## 2013-08-04 MED ORDER — FENTANYL CITRATE 0.05 MG/ML IJ SOLN
INTRAMUSCULAR | Status: AC
  Filled 2013-08-04: qty 5

## 2013-08-04 MED ORDER — CYCLOBENZAPRINE HCL 10 MG PO TABS
10.0000 mg | ORAL_TABLET | Freq: Three times a day (TID) | ORAL | Status: DC | PRN
  Administered 2013-08-04 – 2013-08-10 (×15): 10 mg via ORAL
  Filled 2013-08-04 (×16): qty 1

## 2013-08-04 MED ORDER — NICOTINE 7 MG/24HR TD PT24
7.0000 mg | MEDICATED_PATCH | Freq: Every day | TRANSDERMAL | Status: DC
  Administered 2013-08-04 – 2013-08-10 (×6): 7 mg via TRANSDERMAL
  Filled 2013-08-04 (×7): qty 1

## 2013-08-04 MED ORDER — BUPIVACAINE HCL (PF) 0.25 % IJ SOLN
INTRAMUSCULAR | Status: AC
  Filled 2013-08-04: qty 30

## 2013-08-04 MED ORDER — ONDANSETRON HCL 4 MG/2ML IJ SOLN
INTRAMUSCULAR | Status: DC | PRN
  Administered 2013-08-04: 4 mg via INTRAVENOUS

## 2013-08-04 MED ORDER — 0.9 % SODIUM CHLORIDE (POUR BTL) OPTIME
TOPICAL | Status: DC | PRN
  Administered 2013-08-04: 1000 mL

## 2013-08-04 MED ORDER — PROPOFOL 10 MG/ML IV BOLUS
INTRAVENOUS | Status: AC
  Filled 2013-08-04: qty 20

## 2013-08-04 MED ORDER — ONDANSETRON HCL 4 MG/2ML IJ SOLN
4.0000 mg | Freq: Four times a day (QID) | INTRAMUSCULAR | Status: DC | PRN
  Filled 2013-08-04: qty 2

## 2013-08-04 SURGICAL SUPPLY — 78 items
BANDAGE CONFORM 2  STR LF (GAUZE/BANDAGES/DRESSINGS) IMPLANT
BANDAGE ELASTIC 3 VELCRO ST LF (GAUZE/BANDAGES/DRESSINGS) ×3 IMPLANT
BANDAGE ELASTIC 4 VELCRO ST LF (GAUZE/BANDAGES/DRESSINGS) ×7 IMPLANT
BANDAGE GAUZE ELAST BULKY 4 IN (GAUZE/BANDAGES/DRESSINGS) ×9 IMPLANT
BLADE 15 SAFETY STRL DISP (BLADE) ×2 IMPLANT
BLADE SURG ROTATE 9660 (MISCELLANEOUS) IMPLANT
BNDG CMPR 9X4 STRL LF SNTH (GAUZE/BANDAGES/DRESSINGS) ×1
BNDG ESMARK 4X9 LF (GAUZE/BANDAGES/DRESSINGS) ×3 IMPLANT
BNDG GAUZE ELAST 4 BULKY (GAUZE/BANDAGES/DRESSINGS) ×4 IMPLANT
CANISTER WOUND CARE 500ML ATS (WOUND CARE) ×2 IMPLANT
CORDS BIPOLAR (ELECTRODE) ×3 IMPLANT
COVER SURGICAL LIGHT HANDLE (MISCELLANEOUS) ×3 IMPLANT
CUFF TOURNIQUET SINGLE 18IN (TOURNIQUET CUFF) ×3 IMPLANT
CUFF TOURNIQUET SINGLE 24IN (TOURNIQUET CUFF) IMPLANT
DECANTER SPIKE VIAL GLASS SM (MISCELLANEOUS) IMPLANT
DRAIN TLS ROUND 10FR (DRAIN) IMPLANT
DRAPE INCISE IOBAN 66X45 STRL (DRAPES) ×2 IMPLANT
DRAPE OEC MINIVIEW 54X84 (DRAPES) IMPLANT
DRAPE SURG 17X23 STRL (DRAPES) ×2 IMPLANT
DRAPE U-SHAPE 47X51 STRL (DRAPES) ×3 IMPLANT
DRSG ADAPTIC 3X8 NADH LF (GAUZE/BANDAGES/DRESSINGS) ×3 IMPLANT
DRSG VAC ATS SM SENSATRAC (GAUZE/BANDAGES/DRESSINGS) ×2 IMPLANT
ELECT REM PT RETURN 9FT ADLT (ELECTROSURGICAL)
ELECTRODE REM PT RTRN 9FT ADLT (ELECTROSURGICAL) IMPLANT
GAUZE SPONGE 4X4 16PLY XRAY LF (GAUZE/BANDAGES/DRESSINGS) ×2 IMPLANT
GAUZE XEROFORM 1X8 LF (GAUZE/BANDAGES/DRESSINGS) ×3 IMPLANT
GAUZE XEROFORM 5X9 LF (GAUZE/BANDAGES/DRESSINGS) ×4 IMPLANT
GLOVE BIOGEL M STRL SZ7.5 (GLOVE) ×3 IMPLANT
GLOVE BIOGEL PI IND STRL 7.0 (GLOVE) IMPLANT
GLOVE BIOGEL PI IND STRL 7.5 (GLOVE) IMPLANT
GLOVE BIOGEL PI IND STRL 8 (GLOVE) IMPLANT
GLOVE BIOGEL PI INDICATOR 7.0 (GLOVE) ×2
GLOVE BIOGEL PI INDICATOR 7.5 (GLOVE) ×2
GLOVE BIOGEL PI INDICATOR 8 (GLOVE) ×2
GLOVE ORTHO TXT STRL SZ7.5 (GLOVE) ×3 IMPLANT
GLOVE SS BIOGEL STRL SZ 8 (GLOVE) ×1 IMPLANT
GLOVE SUPERSENSE BIOGEL SZ 8 (GLOVE) ×4
GLOVE SURG SS PI 7.5 STRL IVOR (GLOVE) ×2 IMPLANT
GOWN STRL REUS W/ TWL LRG LVL3 (GOWN DISPOSABLE) ×3 IMPLANT
GOWN STRL REUS W/ TWL XL LVL3 (GOWN DISPOSABLE) ×3 IMPLANT
GOWN STRL REUS W/TWL LRG LVL3 (GOWN DISPOSABLE) ×9
GOWN STRL REUS W/TWL XL LVL3 (GOWN DISPOSABLE) ×9
HANDPIECE INTERPULSE COAX TIP (DISPOSABLE)
KIT BASIN OR (CUSTOM PROCEDURE TRAY) ×3 IMPLANT
KIT ROOM TURNOVER OR (KITS) ×3 IMPLANT
LOOP VESSEL MAXI BLUE (MISCELLANEOUS) IMPLANT
MANIFOLD NEPTUNE II (INSTRUMENTS) ×3 IMPLANT
NDL HYPO 25GX1X1/2 BEV (NEEDLE) IMPLANT
NEEDLE 22X1 1/2 (OR ONLY) (NEEDLE) IMPLANT
NEEDLE HYPO 25GX1X1/2 BEV (NEEDLE) IMPLANT
NS IRRIG 1000ML POUR BTL (IV SOLUTION) ×3 IMPLANT
PACK ORTHO EXTREMITY (CUSTOM PROCEDURE TRAY) ×3 IMPLANT
PAD ABD 8X10 STRL (GAUZE/BANDAGES/DRESSINGS) ×2 IMPLANT
PAD ARMBOARD 7.5X6 YLW CONV (MISCELLANEOUS) ×6 IMPLANT
PAD CAST 4YDX4 CTTN HI CHSV (CAST SUPPLIES) ×1 IMPLANT
PADDING CAST COTTON 4X4 STRL (CAST SUPPLIES) ×6
SET CYSTO W/LG BORE CLAMP LF (SET/KITS/TRAYS/PACK) ×2 IMPLANT
SET HNDPC FAN SPRY TIP SCT (DISPOSABLE) IMPLANT
SPLINT FIBERGLASS 4X30 (CAST SUPPLIES) ×2 IMPLANT
SPONGE GAUZE 4X4 12PLY (GAUZE/BANDAGES/DRESSINGS) ×3 IMPLANT
SPONGE GAUZE 4X4 12PLY STER LF (GAUZE/BANDAGES/DRESSINGS) ×2 IMPLANT
SPONGE LAP 18X18 X RAY DECT (DISPOSABLE) ×3 IMPLANT
SPONGE LAP 4X18 X RAY DECT (DISPOSABLE) ×3 IMPLANT
SUT MNCRL AB 4-0 PS2 18 (SUTURE) ×3 IMPLANT
SUT PROLENE 3 0 PS 2 (SUTURE) IMPLANT
SUT PROLENE 4 0 PS 2 18 (SUTURE) ×4 IMPLANT
SUT VIC AB 3-0 FS2 27 (SUTURE) IMPLANT
SYR CONTROL 10ML LL (SYRINGE) IMPLANT
SYSTEM CHEST DRAIN TLS 7FR (DRAIN) IMPLANT
TOWEL OR 17X24 6PK STRL BLUE (TOWEL DISPOSABLE) ×3 IMPLANT
TOWEL OR 17X26 10 PK STRL BLUE (TOWEL DISPOSABLE) ×3 IMPLANT
TUBE ANAEROBIC SPECIMEN COL (MISCELLANEOUS) IMPLANT
TUBE CONNECTING 12'X1/4 (SUCTIONS) ×1
TUBE CONNECTING 12X1/4 (SUCTIONS) ×2 IMPLANT
TUBE EVACUATION TLS (MISCELLANEOUS) ×3 IMPLANT
UNDERPAD 30X30 INCONTINENT (UNDERPADS AND DIAPERS) ×3 IMPLANT
WATER STERILE IRR 1000ML POUR (IV SOLUTION) ×3 IMPLANT
YANKAUER SUCT BULB TIP NO VENT (SUCTIONS) ×3 IMPLANT

## 2013-08-04 NOTE — ED Notes (Addendum)
Pt's clothing and glasses sent in bag to OR with pt.  Pt's brown pocket book with contents locked up by security in [place] [id number].  Pt taken to OR by Venus, EMT

## 2013-08-04 NOTE — ED Notes (Signed)
Dr. Amedeo Plenty at bedside.  Hospitalist paged per Dr. Amedeo Plenty.  OR ready for pt.

## 2013-08-04 NOTE — Anesthesia Postprocedure Evaluation (Signed)
Anesthesia Post Note  Patient: [name]  Procedure(s) Performed: Procedure(s) (LRB): IRRIGATION AND DEBRIDEMENT RIGHT HAND AND FOREARM (Right) FASCIOTOMY (Right) APPLICATION OF WOUND VAC (Right)  Anesthesia type: General  Patient location: PACU  Post pain: Pain level controlled  Post assessment: Post-op Vital signs reviewed  Last Vitals: BP 130/93  Pulse 83  Temp(Src) 36.6 C (Oral)  Resp 14  Ht 5\' 4"  (1.626 m)  Wt 180 lb (81.647 kg)  BMI 30.88 kg/m2  SpO2 98%  LMP 09/16/2000  Post vital signs: Reviewed  Level of consciousness: sedated  Complications: No apparent anesthesia complications

## 2013-08-04 NOTE — Progress Notes (Signed)
Subjective: Day of Surgery Procedure(s) (LRB): IRRIGATION AND DEBRIDEMENT RIGHT HAND AND FOREARM (Right) FASCIOTOMY (Right) APPLICATION OF WOUND VAC (Right) The patient is awake sitting in the chair, she feels better than yesterday but is still quite pain focused. This is not unexpected given her long history of chronic pain syndrome and opioid dependency. She denies nausea or vomiting. She denies any constitutional symptoms today.  Objective: Vital signs in last 24 hours: Temp:  [97.4 F (36.3 C)-98.6 F (37 C)] 97.4 F (36.3 C) (04/11 1446) Pulse Rate:  [67-101] 67 (04/11 1446) Resp:  [14-22] 16 (04/11 1446) BP: (93-137)/(53-93) 120/54 mmHg (04/11 1446) SpO2:  [94 %-99 %] 97 % (04/11 1446) Weight:  [81.647 kg (180 lb)] 81.647 kg (180 lb) (04/10 1946)  Intake/Output from previous day: 04/10 0701 - 04/11 0700 In: 1420 [P.O.:120; I.V.:1300] Out: -  Intake/Output this shift:     Recent Labs  08/03/13 2130  HGB 13.8    Recent Labs  08/03/13 2130  WBC 12.0*  RBC 4.57  HCT 40.4  PLT 256    Recent Labs  08/03/13 2130  NA 135*  K 3.1*  CL 96  CO2 27  BUN 14  CREATININE 0.77  GLUCOSE 127*  CALCIUM 8.8   No results found for this basename: LABPT, INR,  in the last 72 hours Cultures are pending  Physical exam reveals the patient is sitting up in a chair. She is conversant today He is in no acute distress Head atraumatic normocephalic Chest shows equal bilateral expansions respirations are nonlabored Right upper extremity: Dressings are clean and intact. The wound VAC is intact and functioning appropriately. Taking great deal of time today to participate with active range of motion of the digits as well as passive range of motion. Her sensation is grossly intact refill is intact. Shoulder range of motion is intact  Assessment/Plan: We have discussed with the patient recommendations to return to the operative suite tomorrow for repeat irrigation and debridement as  well as possible closure depending on the wound conditions. We discussed with her she will be imperative to engage herself in the care of her upper extremity. She will be n.p.o. after midnight tonight. We will let her have a regular diet through the course of today. Questions were encouraged and answered Day of Surgery Procedure(s) (LRB): IRRIGATION AND DEBRIDEMENT RIGHT HAND AND FOREARM (Right) FASCIOTOMY (Right) APPLICATION OF WOUND VAC (Right)   Ivan Croft 08/04/2013, 4:39 PM

## 2013-08-04 NOTE — Op Note (Signed)
See [id number] Amedeo Plenty MD

## 2013-08-04 NOTE — Progress Notes (Signed)
Patients purse from security locker returned to patient. Patient denies any home medications in purse and was informed of importance of adhering to hospitals medication regimen.

## 2013-08-04 NOTE — ED Notes (Signed)
Dr Gramig at bedside. 

## 2013-08-04 NOTE — Anesthesia Preprocedure Evaluation (Addendum)
Anesthesia Evaluation  Patient identified by MRN, date of birth, ID band Patient awake    Reviewed: Allergy & Precautions, H&P , NPO status , Patient's Chart, lab work & pertinent test results  Airway Mallampati: II TM Distance: >3 FB     Dental  (+) Dental Advisory Given   Pulmonary shortness of breath and with exertion, Current Smoker,  breath sounds clear to auscultation        Cardiovascular hypertension, Rhythm:Regular Rate:Normal     Neuro/Psych PSYCHIATRIC DISORDERS Anxiety Depression negative neurological ROS     GI/Hepatic Neg liver ROS, PUD, GERD-  Medicated,  Endo/Other  negative endocrine ROS  Renal/GU negative Renal ROS     Musculoskeletal negative musculoskeletal ROS (+)   Abdominal   Peds  Hematology negative hematology ROS (+)   Anesthesia Other Findings   Reproductive/Obstetrics negative OB ROS                          Anesthesia Physical Anesthesia Plan  ASA: III  Anesthesia Plan: General   Post-op Pain Management:    Induction: Intravenous  Airway Management Planned: Oral ETT  Additional Equipment:   Intra-op Plan:   Post-operative Plan: Extubation in OR  Informed Consent: I have reviewed the patients History and Physical, chart, labs and discussed the procedure including the risks, benefits and alternatives for the proposed anesthesia with the patient or authorized representative who has indicated his/her understanding and acceptance.   Dental advisory given  Plan Discussed with: CRNA  Anesthesia Plan Comments:        Anesthesia Quick Evaluation

## 2013-08-04 NOTE — Evaluation (Signed)
Occupational Therapy Evaluation Patient Details Name: [name] MRN: [id number] DOB: 24-Jun-1960 Today's Date: 08/04/2013    History of Present Illness 53 yo female s/p IRRIGATION AND DEBRIDEMENT RIGHT HAND AND FOREARM (Right)   Clinical Impression   Patient is s/p Rt I&D hand and forearm surgery resulting in functional limitations due to the deficits listed below (see OT problem list).  Patient will benefit from skilled OT acutely to increase independence and safety with ADLS to allow discharge outpatient as md feels appropriate for fine motor deficits.     Follow Up Recommendations  Outpatient OT (outpatient when MD feels appropriate)    Equipment Recommendations  None recommended by OT    Recommendations for Other Services       Precautions / Restrictions Restrictions Weight Bearing Restrictions: Yes RUE Weight Bearing: Non weight bearing      Mobility Bed Mobility Overal bed mobility: Needs Assistance Bed Mobility: Supine to Sit     Supine to sit: Mod assist     General bed mobility comments: requries (A) to avoid WB Rt UE and sequence task  Transfers Overall transfer level: Needs assistance Equipment used: 1 person hand held assist Transfers: Sit to/from Stand Sit to Stand: Min assist              Balance                                            ADL Overall ADL's : Needs assistance/impaired     Grooming: Moderate assistance;Standing                   Toilet Transfer: Minimal assistance;Ambulation;Regular Toilet;Grab bars (hha) Toilet Transfer Details (indicate cue type and reason): pt required (A) to complete transfer Toileting- Clothing Manipulation and Hygiene: Minimal assistance;Sit to/from stand       Functional mobility during ADLs: Minimal assistance (hand held (A)) General ADL Comments: Pt very lethargic required extended time to arouse. pt motivated by need to void bladder. pt poor historian. Home  environment at this time unable to assess. Pt will require additional follow up for edema management education and carry over of information. Pt educated and positioned in recliner with pillows. pt void of bladder supine in bed unaware and denies when asked. pt unaware of void of bladder.      Vision                     Perception     Praxis      Pertinent Vitals/Pain None reported Pt lethargic and falling asleep without distress      Hand Dominance Right   Extremity/Trunk Assessment Upper Extremity Assessment Upper Extremity Assessment: RUE deficits/detail RUE Deficits / Details: s/p I&D with soft cast   Lower Extremity Assessment Lower Extremity Assessment: Overall WFL for tasks assessed   Cervical / Trunk Assessment Cervical / Trunk Assessment: Normal   Communication Communication Communication: No difficulties   Cognition Arousal/Alertness: Lethargic Behavior During Therapy: Flat affect Overall Cognitive Status: No family/caregiver present to determine baseline cognitive functioning Area of Impairment: Safety/judgement;Awareness;Attention   Current Attention Level: Focused Memory: Decreased short-term memory;Decreased recall of precautions   Safety/Judgement: Decreased awareness of safety Awareness: Emergent   General Comments: Pt required incr environmental noise, lights removal of covers and name call with tactile input for arousal. pt with focused attention. pt engaged in session due  to urgency to void bladder and immediately returned to lethargic state     General Comments       Exercises       Shoulder Instructions      Home Living Family/patient expects to be discharged to:: Private residence Living Arrangements: Alone                               Additional Comments: Pt unable to provided due to poor historian. pt perseverating about "you just trust people so much sometimes and they let you down" Pt does not explain what this  statement and discussion means or how it relates to current hosptial course. pt lethargic and required max cueing to keep aroused to engage in session      Prior Functioning/Environment Level of Independence: Independent             OT Diagnosis: Generalized weakness;Cognitive deficits;Acute pain   OT Problem List: Decreased strength;Decreased activity tolerance;Impaired balance (sitting and/or standing);Decreased safety awareness;Decreased knowledge of precautions;Pain;Impaired UE functional use   OT Treatment/Interventions: Self-care/ADL training;Therapeutic exercise;DME and/or AE instruction;Therapeutic activities;Cognitive remediation/compensation;Patient/family education;Balance training    OT Goals(Current goals can be found in the care plan section) Acute Rehab OT Goals Patient Stated Goal: none stated OT Goal Formulation: Patient unable to participate in goal setting Time For Goal Achievement: 08/18/13 Potential to Achieve Goals: Good  OT Frequency: Min 2X/week   Barriers to D/C:            Co-evaluation              End of Session Nurse Communication: Mobility status;Precautions  Activity Tolerance: Patient limited by lethargy Patient left: in chair;with call bell/phone within reach   Time: 1021-1042 OT Time Calculation (min): 21 min Charges:  OT General Charges $OT Visit: 1 Procedure OT Evaluation $Initial OT Evaluation Tier I: 1 Procedure OT Treatments $Self Care/Home Management : 8-22 mins G-Codes:    Peri Maris 08-05-13, 11:00 AM Pager: [phone]

## 2013-08-04 NOTE — Transfer of Care (Signed)
Immediate Anesthesia Transfer of Care Note  Patient: [name]  Procedure(s) Performed: Procedure(s): IRRIGATION AND DEBRIDEMENT RIGHT HAND AND FOREARM (Right) FASCIOTOMY (Right) APPLICATION OF WOUND VAC (Right)  Patient Location: PACU  Anesthesia Type:General  Level of Consciousness: awake, alert  and oriented  Airway & Oxygen Therapy: Patient Spontanous Breathing and Patient connected to nasal cannula oxygen  Post-op Assessment: Report given to PACU RN, Post -op Vital signs reviewed and stable and Patient moving all extremities X 4  Post vital signs: Reviewed and stable  Complications: No apparent anesthesia complications

## 2013-08-04 NOTE — ED Notes (Signed)
Dr. Amedeo Plenty notified of pt's arrival.

## 2013-08-04 NOTE — Progress Notes (Signed)
   [facility]                                                                              Patient Demographics  [name], is a 53 y.o. female, DOB - 1961-04-05, JIR:[id number]  Admit date - 08/03/2013   Admitting Physician Roseanne Kaufman, MD  Outpatient Primary MD for the patient is Lanette Hampshire, MD  LOS - 1   Chief Complaint  Patient presents with  . Cellulitis      HPI 53 year old female who presented to the [facility] ED with complaints of increased redness and swelling and pain of her right hand x 3 days. She reports falling onto her Right hand 3 days ago. She reports that the redness was spreading up her forearm. She was evaluated and transferred to [facility] for surgical evaluation by Dr. Amedeo Plenty and underwent an I+D for compartment Syndrome of the Right hand and Forearm. She was placed on IV antibiotic Therapy of Vancomycin and Fortaz. [facility] were consulted for medical management of her medical conditions.    Assessment & Plan  Patient transferred from [facility] emergency department to [facility]. Agree with current assessment and plan done by Dr. Arnoldo Morale.  Compartment syndrome -Orthopedic surgery, Dr. Amedeo Plenty, following -Underwent Irrigation and debridement of the right hand and forearm and Fasciotomy -Continue pain control  Cellulitis of the right hand and forearm -Continue vancomycin and Fortaz  Chronic pain syndrome -Multifactorial: Osteoarthropathy versus cervical disc disease and lumbago -Continue Cymbalta and hopefully pain regimen  Benzodiazepine addiction -Continue Xanax twice a day to prevent withdrawal  Hypertension -Continue triamterene and HCTZ  Peptic ulcer disease -Continue PPI  Bipolar disorder -Currently on no medications  Depression and anxiety -Continue duloxetine  Tobacco abuse -Continue nicotine patch  Hypokalemia -Likely secondary to diuretics -Replaced, will continue to monitor BMP  Code Status:  Full  Family Communication: None at bedside  Disposition Plan:  admitted  Time Spent in minutes   30 minutes  Procedures  Irrigation and debridement of the right hand and forearm Fasciotomy  Consults   TRH  DVT Prophylaxis  SCDs   Rakia Frayne D.O. on 08/04/2013 at 8:41 AM  Between 7am to 7pm - Pager - [phone]  After 7pm go to [email or web address] - password [email or web address]  And look for the night coverage person covering for me after hours  [facility] Office  [phone]

## 2013-08-04 NOTE — H&P (Signed)
[name] is an 53 y.o. female.   Chief Complaint: Swelling, cellulitis, pain, right hand and forearm and wrist after a fall days ago HPI: Patient presents with progressive pain, loss of function, erythema/cellulitis, and inability use her right arm. She was seen [facility] hospital by Dr Wilson Singer.  I was asked to see her. The hospitalist at [facility] briefly saw her. I discussed all issues with the physicians at [facility] and asked she get to [facility] as soon as possible. The she presented to the emergency room.  The patient cannot recall the exact time when she fell. She states it days ago she fell on her right upper extremity after falling on a slick floor and developed significant pain and loss of function days later. She is a very poor historian. She cannot recall specifics of the injury ORIF she had immediate pain after her fall.  She had x-rays at [facility] which were negative of her forearm elbow and hand. She has significant blistering and your family about the arm as well as what appears to be tight compartments and inability to move the fingers.   she relates a host of stress-related issues in her life.  She denies prior injury to the extremity. She denies IV drug abuse. She denies other trauma besides the fall. She states she has chronic neck and back pain.  She denies nausea vomiting at present time. She denies exposure to any obvious pathogens.    Past Medical History  Diagnosis Date  . Allergic rhinitis   . Anxiety and depression   . Osteoarthritis   . PUD (peptic ulcer disease)   . Gout   . DJD (degenerative joint disease) of lumbar spine   . Chronic back pain   . GI bleed   . Chronic pain syndrome   . Cervical cancer     Past Surgical History  Procedure Laterality Date  . Total abdominal hysterectomy    . Cervival cone    . Tubal ligation    . Appendectomy    . Throat surgery      Family History  Problem Relation Age of Onset  . Coronary artery  disease Father   . Hypertension Father   . Diabetes type II Mother   . Alcohol abuse Mother    Social History:  reports that she has been smoking Cigarettes.  She has a 80 pack-year smoking history. She has never used smokeless tobacco. She reports that she does not drink alcohol or use illicit drugs.  Allergies:  Allergies  Allergen Reactions  . Vicodin [Hydrocodone-Acetaminophen] Itching     (Not in a hospital admission)  Results for orders placed during the hospital encounter of 08/03/13 (from the past 48 hour(s))  CBC WITH DIFFERENTIAL     Status: Abnormal   Collection Time    08/03/13  9:30 PM      Result Value Ref Range   WBC 12.0 (*) 4.0 - 10.5 K/uL   RBC 4.57  3.87 - 5.11 MIL/uL   Hemoglobin 13.8  12.0 - 15.0 g/dL   HCT 40.4  36.0 - 46.0 %   MCV 88.4  78.0 - 100.0 fL   MCH 30.2  26.0 - 34.0 pg   MCHC 34.2  30.0 - 36.0 g/dL   RDW 14.8  11.5 - 15.5 %   Platelets 256  150 - 400 K/uL   Neutrophils Relative % 71  43 - 77 %   Neutro Abs 8.5 (*) 1.7 -  7.7 K/uL   Lymphocytes Relative 18  12 - 46 %   Lymphs Abs 2.2  0.7 - 4.0 K/uL   Monocytes Relative 8  3 - 12 %   Monocytes Absolute 1.0  0.1 - 1.0 K/uL   Eosinophils Relative 3  0 - 5 %   Eosinophils Absolute 0.3  0.0 - 0.7 K/uL   Basophils Relative 0  0 - 1 %   Basophils Absolute 0.0  0.0 - 0.1 K/uL  BASIC METABOLIC PANEL     Status: Abnormal   Collection Time    08/03/13  9:30 PM      Result Value Ref Range   Sodium 135 (*) 137 - 147 mEq/L   Potassium 3.1 (*) 3.7 - 5.3 mEq/L   Chloride 96  96 - 112 mEq/L   CO2 27  19 - 32 mEq/L   Glucose, Bld 127 (*) 70 - 99 mg/dL   BUN 14  6 - 23 mg/dL   Creatinine, Ser 0.77  0.50 - 1.10 mg/dL   Calcium 8.8  8.4 - 10.5 mg/dL   GFR calc non Af Amer >90  >90 mL/min   GFR calc Af Amer >90  >90 mL/min   Comment: (NOTE)     The eGFR has been calculated using the CKD EPI equation.     This calculation has not been validated in all clinical situations.     eGFR's persistently <90  mL/min signify possible Chronic Kidney     Disease.  LACTIC ACID, PLASMA     Status: None   Collection Time    08/03/13  9:30 PM      Result Value Ref Range   Lactic Acid, Venous 0.9  0.5 - 2.2 mmol/L   Dg Forearm Right  08/03/2013   CLINICAL DATA:  CELLULITIS  EXAM: RIGHT WRIST - COMPLETE 3+ VIEW; RIGHT HAND - COMPLETE 3+ VIEW; RIGHT FOREARM - 2 VIEW  COMPARISON:  DG ELBOW COMPLETE*R* dated 07/11/2013  FINDINGS: There is no evidence of fracture or dislocation. There is no evidence of arthropathy or other focal bone abnormality. Wrist and hand soft tissue swelling. Anterior forearm skin irregularity without subcutaneous gas or radiopaque foreign bodies.  IMPRESSION: No fracture deformity,, dislocation or destructive bony lesions.  Soft tissue swelling of the anterior forearm skin irregularity, no subcutaneous gas or radiopaque foreign bodies.   Electronically Signed   By: Elon Alas   On: 08/03/2013 20:26   Dg Wrist Complete Right  08/03/2013   CLINICAL DATA:  CELLULITIS  EXAM: RIGHT WRIST - COMPLETE 3+ VIEW; RIGHT HAND - COMPLETE 3+ VIEW; RIGHT FOREARM - 2 VIEW  COMPARISON:  DG ELBOW COMPLETE*R* dated 07/11/2013  FINDINGS: There is no evidence of fracture or dislocation. There is no evidence of arthropathy or other focal bone abnormality. Wrist and hand soft tissue swelling. Anterior forearm skin irregularity without subcutaneous gas or radiopaque foreign bodies.  IMPRESSION: No fracture deformity,, dislocation or destructive bony lesions.  Soft tissue swelling of the anterior forearm skin irregularity, no subcutaneous gas or radiopaque foreign bodies.   Electronically Signed   By: Elon Alas   On: 08/03/2013 20:26   Dg Hand Complete Right  08/03/2013   CLINICAL DATA:  CELLULITIS  EXAM: RIGHT WRIST - COMPLETE 3+ VIEW; RIGHT HAND - COMPLETE 3+ VIEW; RIGHT FOREARM - 2 VIEW  COMPARISON:  DG ELBOW COMPLETE*R* dated 07/11/2013  FINDINGS: There is no evidence of fracture or dislocation. There is  no evidence of arthropathy or other focal  bone abnormality. Wrist and hand soft tissue swelling. Anterior forearm skin irregularity without subcutaneous gas or radiopaque foreign bodies.  IMPRESSION: No fracture deformity,, dislocation or destructive bony lesions.  Soft tissue swelling of the anterior forearm skin irregularity, no subcutaneous gas or radiopaque foreign bodies.   Electronically Signed   By: Elon Alas   On: 08/03/2013 20:26    ROS  Blood pressure 137/78, pulse 72, temperature 98.2 F (36.8 C), temperature source Oral, resp. rate 18, height _0  (1.626 m), weight 81.647 kg (180 lb), last menstrual period 09/16/2000, SpO2 96.00%. Physical Exam  white female alert. She complains of pain in the right forearm hand and fingers. She has numbness in the fingertips. She cannot move the fingers well. She has blistering about the forearm and this includes the volar and dorsal aspects. She has no evidence of instability about the forearm. She holds the hand in a flexed position and has difficulty with extension of the fingers passively and actively. She can flex the fingers one side hold them in extension. She has obvious cellulitis and erythema as well as the blistering which would suggest infectious cellulitic process. She has tight thenar space region about her thumb suggestive of compartment syndrome. I am concerned about her compartments in her thenar space and forearm as they relate to the numbness tingling and evolving cellulitis.  Abdomen is obese nontender nondistended. Chest is clear. Heart regular rate. Neck is stable HEENT is without obvious abnormality. Her pelvis is stable. Her left upper extremity is neurovascular intact and looks normal. Her lower extremity examination shows normal dorsalis pedis pulses and normal motor function without abnormality.  She has a outstanding picture of involving compartment syndrome and cellulitic issues. Assessment/Plan Evolving cellulitis and  erythema with loss of sensation motor function and what appears to be a a evolving compartment phenomenon after a fall days ago. I would recommend we take her to the operative theater for emergent compartment release including carpal tunnel release thenar space release and forearm release. I like to evaluate the fascia and muscle integrity and get the pressure off of her arm. I would give her a guarded prognosis given all these issues. We're do everything we can to try to save her limb. I discussed with her that I had very significant concerns in regards to her predicament. I discussed with her risk of surgery including loss of limb. I discussed her risk of continuing infection and worsening clinical picture.  I've called the hospitalist to help [place] with her care. I feel she needs a thorough evaluation.  I should note she denies IV drug abuse or other type of puncture wound etc.  We'll proceed urgently.  She was seen at bedside it 12:45 AM August 03 2013   We are planning surgery for your upper extremity. The risk and benefits of surgery include risk of bleeding infection anesthesia damage to normal structures and failure of the surgery to accomplish its intended goals of relieving symptoms and restoring function with this in mind we'll going to proceed. I have specifically discussed with the patient the pre-and postoperative regime and the does and don'ts and risk and benefits in great detail. Risk and benefits of surgery also include risk of dystrophy chronic nerve pain failure of the healing process to go onto completion and other inherent risks of surgery The relavent the pathophysiology of the disease/injury process, as well as the alternatives for treatment and postoperative course of action has been discussed in great detail with the  patient who desires to proceed.  We will do everything in our power to help you (the patient) restore function to the upper extremity. Is a pleasure to see this  patient today.   Roseanne Kaufman 08/04/2013, 12:48 AM

## 2013-08-04 NOTE — Anesthesia Procedure Notes (Signed)
Procedure Name: Intubation Date/Time: 08/04/2013 1:47 AM Performed by: Valetta Fuller Pre-anesthesia Checklist: Patient identified, Emergency Drugs available, Suction available and Patient being monitored Patient Re-evaluated:Patient Re-evaluated prior to inductionOxygen Delivery Method: Circle system utilized Preoxygenation: Pre-oxygenation with 100% oxygen Intubation Type: IV induction Ventilation: Mask ventilation without difficulty Laryngoscope Size: Miller and 2 Grade View: Grade I Tube type: Oral Tube size: 7.5 mm Number of attempts: 1 Airway Equipment and Method: Stylet Placement Confirmation: ETT inserted through vocal cords under direct vision,  positive ETCO2 and breath sounds checked- equal and bilateral Secured at: 21 cm Tube secured with: Tape Dental Injury: Teeth and Oropharynx as per pre-operative assessment

## 2013-08-04 NOTE — Consult Note (Addendum)
[facility] Medical Consultation  08/04/2013, 4:46 AM      Brock Bad AST:[id number] DOB: 1960/09/29 DOA: 08/03/2013 PCP: Lanette Hampshire, MD   Requesting physician:  Dr. Amedeo Plenty (Orthopedics) Date of consultation: 08/03/2013 Reason for consultation:  Medical Management    Impression/Recommendations Principal Problem:   Compartment syndrome, traumatic, upper extremity Active Problems:   Cellulitis of hand, right   Chronic pain   DISORDER, BIPOLAR NOS   BENZODIAZEPINE ADDICTION   TOBACCO ABUSE   PEPTIC ULCER DISEASE   OSTEOARTHRITIS   Hypertension   Hypokalemia   1.   Compartment Syndrome- surgery performed by Dr. Amedeo Plenty.    2.  Cellulitis of Right Hand and ForeArm-   Placed on IV Vancomycin and Fortaz per Ortho.    3.   Chronic Pain syndrome- Multifactorial, on Cymbalta and Opioid Pain Regimen at 15 mg PO TID for Osteoarthropathy, Cerical Disk Disease and Lumbago.    4.   Benzodiazepine Addiction-  Hx, on Alprazolam BID currently, and continue to prevent withdrawal.   5.   HTN-  Monitor BPs, and continue Triamterene/HCTZ Rx.    6.   PUD- on Omeprazole at home, Protonix while Inpatient.    7.   Bipolar Disoder-  Not currently on medications for Bipolar, but is on meds for Anxiety and Depression.     8.   Tobacco Use Disorder-   Nicotine Patch while inpatient  9.   SCDs ordered for DVT prophylaxis.     10. Hypokalemia-  Due to Thiazide Diuretic, Replete K+.       The [facility]  will followup again tomorrow. Please [facility] if we can be of assistance in the meanwhile. Thank you for this consultation.    Chief Complaint:    Swelling and Pain Right Forearm and Hand  HPI: 53 year old female who presented to the [facility] ED with complaints of increased redness and swelling and pain of her right hand x 3 days.  She reports falling onto her Right hand 3 days ago.  She reports that the redness was spreading up her forearm.   She was evaluated  and transferred to [facility] for surgical evaluation by Dr. Amedeo Plenty and underwent an I+D for compartment Syndrome of the Right hand and Forearm.  She was placed on IV antibiotic Therapy of Vancomycin and Fortaz.   [facility] were consulted for medical management of her medical conditions.     Review of Systems:  Constitutional: No Weight Loss, No Weight Gain, Night Sweats, Fevers, Chills, Fatigue, or Generalized Weakness HEENT: No Headaches, Difficulty Swallowing,Tooth/Dental Problems,Sore Throat,  No Sneezing, Rhinitis, Ear Ache, Nasal Congestion, or Post Nasal Drip,  Cardio-vascular:  No Chest pain, Orthopnea, PND, Edema in lower extremities, Anasarca, Dizziness, Palpitations  Resp: No Dyspnea, No DOE, No Productive Cough, No Non-Productive Cough, No Hemoptysis, No Change in Color of Mucus,  No Wheezing.    GI: No Heartburn, Indigestion, Abdominal Pain, Nausea, Vomiting, Diarrhea, Change in Bowel Habits,  Loss of Appetite  GU: No Dysuria, Change in Color of Urine, No Urgency or Frequency.  No flank pain.  Musculoskeletal: +Swelling  Pain and Redness of Right ForeArm,   +Chronic Back Pain.  Neurologic: No Syncope, No Seizures, Muscle Weakness, Paresthesia, Vision Disturbance or Loss, No Diplopia, No Vertigo, No Difficulty Walking,  Skin: No Rash or Lesions. Psych: No Change in Mood or Affect. No Depression or Anxiety. No Memory loss. No Confusion or Hallucinations   Past Medical History  Diagnosis  Date  . Allergic rhinitis   . Anxiety and depression   . Osteoarthritis   . PUD (peptic ulcer disease)   . Gout   . DJD (degenerative joint disease) of lumbar spine   . Chronic back pain   . GI bleed   . Chronic pain syndrome   . Cervical cancer    Past Surgical History  Procedure Laterality Date  . Total abdominal hysterectomy    . Cervival cone    . Tubal ligation    . Appendectomy    . Throat surgery       Prior to Admission medications   Medication Sig Start Date End  Date Taking? Authorizing Provider  ALPRAZolam Duanne Moron) 1 MG tablet Take 1 mg by mouth 2 (two) times daily as needed for anxiety.     Historical Provider, MD  cyclobenzaprine (FLEXERIL) 10 MG tablet Take 10 mg by mouth 3 (three) times daily as needed. For muscle spasms     Historical Provider, MD  CYMBALTA 60 MG capsule Take 60 mg by mouth at bedtime. 03/19/12   Historical Provider, MD  nitroGLYCERIN (NITROSTAT) 0.3 MG SL tablet Place 0.3 mg under the tongue every 5 (five) minutes as needed for chest pain.    Historical Provider, MD  omeprazole (PRILOSEC) 40 MG capsule Take 40 mg by mouth daily.  03/29/12   Historical Provider, MD  oxycodone (ROXICODONE) 30 MG immediate release tablet Take 30 mg by mouth 4 (four) times daily.      Historical Provider, MD  triamterene-hydrochlorothiazide (MAXZIDE-25) 37.5-25 MG per tablet Take 1 tablet by mouth every morning.  04/08/12   Historical Provider, MD     Allergies  Allergen Reactions  . Vicodin [Hydrocodone-Acetaminophen] Itching    Social History:  reports that she has been smoking Cigarettes.  She has a 80 pack-year smoking history. She has never used smokeless tobacco. She reports that she does not drink alcohol or use illicit drugs.   Family History  Problem Relation Age of Onset  . Coronary artery disease Father   . Hypertension Father   . Diabetes type II Mother   . Alcohol abuse Mother     Physical Exam: Blood pressure 110/58, pulse 75, temperature 97.8 F (36.6 C), temperature source Oral, resp. rate 18, height 5\' 4"  (1.626 m), weight 81.647 kg (180 lb), last menstrual period 09/16/2000, SpO2 99.00%. Filed Vitals:   08/04/13 0356  BP: 110/58  Pulse: 75  Temp: 97.8 F (36.6 C)  Resp: 18  PSYCH: She is alert and oriented x4; does not appear anxious does not appear depressed; affect is normal HEENT: Normocephalic and Atraumatic, Mucous membranes pink; PERRLA; EOM intact; Fundi:  Benign;  No scleral icterus, Nares: Patent, Oropharynx:  Clear, Fair Dentition, Neck:  FROM, no cervical lymphadenopathy nor thyromegaly or carotid bruit; no JVD; Breasts:: Not examined CHEST WALL: No tenderness CHEST: Normal respiration, clear to auscultation bilaterally HEART: Regular rate and rhythm; no murmurs rubs or gallops BACK: No kyphosis or scoliosis; no CVA tenderness ABDOMEN: Positive Bowel Sounds, Obese, soft non-tender; no masses, no organomegaly. Rectal Exam: Not done EXTREMITIES: +Cast on Left ForeArm,  No cyanosis, clubbing or edema; no ulcerations. Genitalia: not examined PULSES: 2+ and symmetric SKIN: Normal hydration no rash or ulceration CNS:  Alert and Oriented X 4, No Focal Deficits Vascular: pulses palpable throughout    Labs on Admission:  Basic Metabolic Panel:  Recent Labs Lab 08/03/13 2130  NA 135*  K 3.1*  CL 96  CO2 27  GLUCOSE 127*  BUN 14  CREATININE 0.77  CALCIUM 8.8   Liver Function Tests: No results found for this basename: AST, ALT, ALKPHOS, BILITOT, PROT, ALBUMIN,  in the last 168 hours No results found for this basename: LIPASE, AMYLASE,  in the last 168 hours No results found for this basename: AMMONIA,  in the last 168 hours CBC:  Recent Labs Lab 08/03/13 2130  WBC 12.0*  NEUTROABS 8.5*  HGB 13.8  HCT 40.4  MCV 88.4  PLT 256   Cardiac Enzymes: No results found for this basename: CKTOTAL, CKMB, CKMBINDEX, TROPONINI,  in the last 168 hours BNP: No components found with this basename: POCBNP,  CBG: No results found for this basename: GLUCAP,  in the last 168 hours  Radiological Exams on Admission: Dg Forearm Right  08/03/2013   CLINICAL DATA:  CELLULITIS  EXAM: RIGHT WRIST - COMPLETE 3+ VIEW; RIGHT HAND - COMPLETE 3+ VIEW; RIGHT FOREARM - 2 VIEW  COMPARISON:  DG ELBOW COMPLETE*R* dated 07/11/2013  FINDINGS: There is no evidence of fracture or dislocation. There is no evidence of arthropathy or other focal bone abnormality. Wrist and hand soft tissue swelling. Anterior forearm skin  irregularity without subcutaneous gas or radiopaque foreign bodies.  IMPRESSION: No fracture deformity,, dislocation or destructive bony lesions.  Soft tissue swelling of the anterior forearm skin irregularity, no subcutaneous gas or radiopaque foreign bodies.   Electronically Signed   By: Elon Alas   On: 08/03/2013 20:26   Dg Wrist Complete Right  08/03/2013   CLINICAL DATA:  CELLULITIS  EXAM: RIGHT WRIST - COMPLETE 3+ VIEW; RIGHT HAND - COMPLETE 3+ VIEW; RIGHT FOREARM - 2 VIEW  COMPARISON:  DG ELBOW COMPLETE*R* dated 07/11/2013  FINDINGS: There is no evidence of fracture or dislocation. There is no evidence of arthropathy or other focal bone abnormality. Wrist and hand soft tissue swelling. Anterior forearm skin irregularity without subcutaneous gas or radiopaque foreign bodies.  IMPRESSION: No fracture deformity,, dislocation or destructive bony lesions.  Soft tissue swelling of the anterior forearm skin irregularity, no subcutaneous gas or radiopaque foreign bodies.   Electronically Signed   By: Elon Alas   On: 08/03/2013 20:26   Dg Hand Complete Right  08/03/2013   CLINICAL DATA:  CELLULITIS  EXAM: RIGHT WRIST - COMPLETE 3+ VIEW; RIGHT HAND - COMPLETE 3+ VIEW; RIGHT FOREARM - 2 VIEW  COMPARISON:  DG ELBOW COMPLETE*R* dated 07/11/2013  FINDINGS: There is no evidence of fracture or dislocation. There is no evidence of arthropathy or other focal bone abnormality. Wrist and hand soft tissue swelling. Anterior forearm skin irregularity without subcutaneous gas or radiopaque foreign bodies.  IMPRESSION: No fracture deformity,, dislocation or destructive bony lesions.  Soft tissue swelling of the anterior forearm skin irregularity, no subcutaneous gas or radiopaque foreign bodies.   Electronically Signed   By: Elon Alas   On: 08/03/2013 20:26     EKG: Independently reviewed. Normal Sinus Rhythm No Acute S-T changes,  Rate 91 and Occasional PVC   Time spent: 40 Minutes  Theressa Millard MD [facility] Pager [phone]  If 7PM-7AM, please contact night-coverage [email or web address] Password [facility] 08/04/2013, 4:47 AM

## 2013-08-05 ENCOUNTER — Encounter (HOSPITAL_COMMUNITY): Payer: Self-pay | Admitting: Anesthesiology

## 2013-08-05 ENCOUNTER — Encounter (HOSPITAL_COMMUNITY): Payer: Medicaid Other | Admitting: Anesthesiology

## 2013-08-05 ENCOUNTER — Encounter (HOSPITAL_COMMUNITY)
Admission: EM | Disposition: A | Discharge status: Home / Self Care | Payer: Self-pay | Source: Home / Self Care | Attending: Orthopedic Surgery

## 2013-08-05 ENCOUNTER — Inpatient Hospital Stay (HOSPITAL_COMMUNITY): Payer: Medicaid Other | Admitting: Anesthesiology

## 2013-08-05 DIAGNOSIS — M109 Gout, unspecified: Secondary | ICD-10-CM

## 2013-08-05 DIAGNOSIS — M79609 Pain in unspecified limb: Secondary | ICD-10-CM

## 2013-08-05 HISTORY — PX: I & D EXTREMITY: SHX5045

## 2013-08-05 LAB — BASIC METABOLIC PANEL
BUN: 8 mg/dL (ref 6–23)
CO2: 28 mEq/L (ref 19–32)
CREATININE: 0.71 mg/dL (ref 0.50–1.10)
Calcium: 8.8 mg/dL (ref 8.4–10.5)
Chloride: 100 mEq/L (ref 96–112)
Glucose, Bld: 100 mg/dL — ABNORMAL HIGH (ref 70–99)
POTASSIUM: 3.4 meq/L — AB (ref 3.7–5.3)
Sodium: 140 mEq/L (ref 137–147)

## 2013-08-05 LAB — CBC WITH DIFFERENTIAL/PLATELET
BASOS ABS: 0 10*3/uL (ref 0.0–0.1)
BASOS PCT: 0 % (ref 0–1)
EOS ABS: 0.5 10*3/uL (ref 0.0–0.7)
Eosinophils Relative: 6 % — ABNORMAL HIGH (ref 0–5)
HCT: 39.2 % (ref 36.0–46.0)
HEMOGLOBIN: 13.2 g/dL (ref 12.0–15.0)
Lymphocytes Relative: 29 % (ref 12–46)
Lymphs Abs: 2.3 10*3/uL (ref 0.7–4.0)
MCH: 30.3 pg (ref 26.0–34.0)
MCHC: 33.7 g/dL (ref 30.0–36.0)
MCV: 90.1 fL (ref 78.0–100.0)
MONOS PCT: 8 % (ref 3–12)
Monocytes Absolute: 0.7 10*3/uL (ref 0.1–1.0)
NEUTROS PCT: 57 % (ref 43–77)
Neutro Abs: 4.7 10*3/uL (ref 1.7–7.7)
Platelets: 264 10*3/uL (ref 150–400)
RBC: 4.35 MIL/uL (ref 3.87–5.11)
RDW: 15 % (ref 11.5–15.5)
WBC: 8.2 10*3/uL (ref 4.0–10.5)

## 2013-08-05 SURGERY — IRRIGATION AND DEBRIDEMENT EXTREMITY
Anesthesia: General | Site: Arm Lower | Laterality: Right

## 2013-08-05 MED ORDER — KETOROLAC TROMETHAMINE 30 MG/ML IJ SOLN
INTRAMUSCULAR | Status: DC | PRN
  Administered 2013-08-05: 30 mg via INTRAVENOUS

## 2013-08-05 MED ORDER — PROMETHAZINE HCL 25 MG/ML IJ SOLN: 6.2500 mg | INTRAMUSCULAR | Status: DC | PRN

## 2013-08-05 MED ORDER — MIDAZOLAM HCL 5 MG/5ML IJ SOLN
INTRAMUSCULAR | Status: DC | PRN
  Administered 2013-08-05 (×2): 1 mg via INTRAVENOUS

## 2013-08-05 MED ORDER — PHENYLEPHRINE 40 MCG/ML (10ML) SYRINGE FOR IV PUSH (FOR BLOOD PRESSURE SUPPORT)
PREFILLED_SYRINGE | INTRAVENOUS | Status: AC
  Filled 2013-08-05: qty 10

## 2013-08-05 MED ORDER — ONDANSETRON HCL 4 MG/2ML IJ SOLN
INTRAMUSCULAR | Status: DC | PRN
  Administered 2013-08-05: 4 mg via INTRAVENOUS

## 2013-08-05 MED ORDER — OXYCODONE HCL 5 MG PO TABS
ORAL_TABLET | ORAL | Status: AC
  Administered 2013-08-06: 10 mg via ORAL
  Filled 2013-08-05: qty 1

## 2013-08-05 MED ORDER — ACETAMINOPHEN 325 MG PO TABS: 325.0000 mg | ORAL_TABLET | ORAL | Status: DC | PRN

## 2013-08-05 MED ORDER — LACTATED RINGERS IV SOLN
INTRAVENOUS | Status: DC | PRN
  Administered 2013-08-05: 07:00:00 via INTRAVENOUS

## 2013-08-05 MED ORDER — BUPIVACAINE HCL (PF) 0.25 % IJ SOLN
INTRAMUSCULAR | Status: AC
  Filled 2013-08-05: qty 30

## 2013-08-05 MED ORDER — KETAMINE HCL 100 MG/ML IJ SOLN
INTRAMUSCULAR | Status: AC
  Filled 2013-08-05: qty 1

## 2013-08-05 MED ORDER — MIDAZOLAM HCL 2 MG/2ML IJ SOLN
INTRAMUSCULAR | Status: AC
Start: 2013-08-05 — End: 2013-08-05
  Filled 2013-08-05: qty 2

## 2013-08-05 MED ORDER — KETAMINE HCL 10 MG/ML IJ SOLN
INTRAMUSCULAR | Status: DC | PRN
  Administered 2013-08-05: 40 mg via INTRAVENOUS

## 2013-08-05 MED ORDER — FENTANYL CITRATE 0.05 MG/ML IJ SOLN
INTRAMUSCULAR | Status: AC
  Filled 2013-08-05: qty 5

## 2013-08-05 MED ORDER — ONDANSETRON HCL 4 MG/2ML IJ SOLN
INTRAMUSCULAR | Status: AC
  Filled 2013-08-05: qty 2

## 2013-08-05 MED ORDER — PHENYLEPHRINE HCL 10 MG/ML IJ SOLN
INTRAMUSCULAR | Status: DC | PRN
  Administered 2013-08-05 (×2): 40 ug via INTRAVENOUS
  Administered 2013-08-05: 80 ug via INTRAVENOUS

## 2013-08-05 MED ORDER — LIDOCAINE HCL (CARDIAC) 20 MG/ML IV SOLN
INTRAVENOUS | Status: AC
  Filled 2013-08-05: qty 5

## 2013-08-05 MED ORDER — DEXTROSE 5 % IV SOLN
INTRAVENOUS | Status: DC | PRN
  Administered 2013-08-05: 10:00:00 via INTRAVENOUS

## 2013-08-05 MED ORDER — SODIUM CHLORIDE 0.9 % IR SOLN
Status: DC | PRN
  Administered 2013-08-05: 6000 mL

## 2013-08-05 MED ORDER — METHOCARBAMOL 100 MG/ML IJ SOLN
500.0000 mg | Freq: Four times a day (QID) | INTRAVENOUS | Status: DC | PRN
  Filled 2013-08-05: qty 5

## 2013-08-05 MED ORDER — PROPOFOL 10 MG/ML IV BOLUS
INTRAVENOUS | Status: AC
  Filled 2013-08-05: qty 20

## 2013-08-05 MED ORDER — LIDOCAINE HCL (CARDIAC) 20 MG/ML IV SOLN
INTRAVENOUS | Status: DC | PRN
  Administered 2013-08-05: 70 mg via INTRAVENOUS

## 2013-08-05 MED ORDER — OXYCODONE HCL 5 MG/5ML PO SOLN: 5.0000 mg | Freq: Once | ORAL | Status: AC | PRN

## 2013-08-05 MED ORDER — ACETAMINOPHEN 160 MG/5ML PO SOLN: 325.0000 mg | ORAL | Status: DC | PRN

## 2013-08-05 MED ORDER — KETOROLAC TROMETHAMINE 30 MG/ML IJ SOLN
15.0000 mg | Freq: Once | INTRAMUSCULAR | Status: AC | PRN
  Administered 2013-08-05: 30 mg via INTRAVENOUS

## 2013-08-05 MED ORDER — OXYCODONE HCL 5 MG PO TABS
5.0000 mg | ORAL_TABLET | Freq: Once | ORAL | Status: AC | PRN
  Administered 2013-08-05: 5 mg via ORAL

## 2013-08-05 MED ORDER — HYDROMORPHONE HCL PF 1 MG/ML IJ SOLN
INTRAMUSCULAR | Status: AC
  Filled 2013-08-05: qty 1

## 2013-08-05 MED ORDER — ARTIFICIAL TEARS OP OINT
TOPICAL_OINTMENT | OPHTHALMIC | Status: AC
  Filled 2013-08-05: qty 3.5

## 2013-08-05 MED ORDER — PROPOFOL 10 MG/ML IV BOLUS
INTRAVENOUS | Status: DC | PRN
  Administered 2013-08-05: 160 mg via INTRAVENOUS

## 2013-08-05 MED ORDER — ARTIFICIAL TEARS OP OINT
TOPICAL_OINTMENT | OPHTHALMIC | Status: DC | PRN
  Administered 2013-08-05: 1 via OPHTHALMIC

## 2013-08-05 MED ORDER — HYDROMORPHONE HCL PF 1 MG/ML IJ SOLN
0.2500 mg | INTRAMUSCULAR | Status: DC | PRN
  Administered 2013-08-05 (×4): 0.5 mg via INTRAVENOUS

## 2013-08-05 MED ORDER — KETOROLAC TROMETHAMINE 30 MG/ML IJ SOLN
INTRAMUSCULAR | Status: AC
  Filled 2013-08-05: qty 1

## 2013-08-05 MED ORDER — 0.9 % SODIUM CHLORIDE (POUR BTL) OPTIME
TOPICAL | Status: DC | PRN
  Administered 2013-08-05: 1000 mL

## 2013-08-05 MED ORDER — POTASSIUM CHLORIDE CRYS ER 20 MEQ PO TBCR
40.0000 meq | EXTENDED_RELEASE_TABLET | Freq: Once | ORAL | Status: AC
  Administered 2013-08-05: 40 meq via ORAL
  Filled 2013-08-05: qty 2

## 2013-08-05 MED ORDER — FENTANYL CITRATE 0.05 MG/ML IJ SOLN
INTRAMUSCULAR | Status: DC | PRN
  Administered 2013-08-05: 50 ug via INTRAVENOUS
  Administered 2013-08-05: 100 ug via INTRAVENOUS
  Administered 2013-08-05 (×2): 50 ug via INTRAVENOUS

## 2013-08-05 MED ORDER — HYDROMORPHONE HCL 2 MG PO TABS
4.0000 mg | ORAL_TABLET | ORAL | Status: DC | PRN
  Administered 2013-08-05 – 2013-08-10 (×17): 4 mg via ORAL
  Filled 2013-08-05 (×18): qty 2

## 2013-08-05 SURGICAL SUPPLY — 48 items
BANDAGE CONFORM 2  STR LF (GAUZE/BANDAGES/DRESSINGS) IMPLANT
BANDAGE ELASTIC 4 VELCRO ST LF (GAUZE/BANDAGES/DRESSINGS) ×3 IMPLANT
BANDAGE ELASTIC 6 VELCRO ST LF (GAUZE/BANDAGES/DRESSINGS) ×2 IMPLANT
BANDAGE GAUZE ELAST BULKY 4 IN (GAUZE/BANDAGES/DRESSINGS) ×7 IMPLANT
BNDG GAUZE ELAST 4 BULKY (GAUZE/BANDAGES/DRESSINGS) ×2 IMPLANT
CANISTER WOUND CARE 500ML ATS (WOUND CARE) ×2 IMPLANT
CORDS BIPOLAR (ELECTRODE) ×3 IMPLANT
CUFF TOURNIQUET SINGLE 18IN (TOURNIQUET CUFF) ×3 IMPLANT
CUFF TOURNIQUET SINGLE 24IN (TOURNIQUET CUFF) IMPLANT
DRSG ADAPTIC 3X8 NADH LF (GAUZE/BANDAGES/DRESSINGS) ×1 IMPLANT
DRSG VAC ATS MED SENSATRAC (GAUZE/BANDAGES/DRESSINGS) ×2 IMPLANT
ELECT REM PT RETURN 9FT ADLT (ELECTROSURGICAL)
ELECTRODE REM PT RTRN 9FT ADLT (ELECTROSURGICAL) IMPLANT
GAUZE XEROFORM 1X8 LF (GAUZE/BANDAGES/DRESSINGS) ×1 IMPLANT
GAUZE XEROFORM 5X9 LF (GAUZE/BANDAGES/DRESSINGS) ×2 IMPLANT
GLOVE BIOGEL M STRL SZ7.5 (GLOVE) ×3 IMPLANT
GLOVE SS BIOGEL STRL SZ 8 (GLOVE) ×1 IMPLANT
GLOVE SUPERSENSE BIOGEL SZ 8 (GLOVE) ×2
GOWN STRL REUS W/ TWL LRG LVL3 (GOWN DISPOSABLE) ×3 IMPLANT
GOWN STRL REUS W/ TWL XL LVL3 (GOWN DISPOSABLE) ×3 IMPLANT
GOWN STRL REUS W/TWL LRG LVL3 (GOWN DISPOSABLE) ×6
GOWN STRL REUS W/TWL XL LVL3 (GOWN DISPOSABLE) ×3
HANDPIECE INTERPULSE COAX TIP (DISPOSABLE)
KIT BASIN OR (CUSTOM PROCEDURE TRAY) ×3 IMPLANT
KIT ROOM TURNOVER OR (KITS) ×3 IMPLANT
MANIFOLD NEPTUNE II (INSTRUMENTS) ×3 IMPLANT
NDL HYPO 25GX1X1/2 BEV (NEEDLE) IMPLANT
NEEDLE HYPO 25GX1X1/2 BEV (NEEDLE) ×3 IMPLANT
NS IRRIG 1000ML POUR BTL (IV SOLUTION) ×3 IMPLANT
PACK ORTHO EXTREMITY (CUSTOM PROCEDURE TRAY) ×3 IMPLANT
PAD ARMBOARD 7.5X6 YLW CONV (MISCELLANEOUS) ×6 IMPLANT
PAD CAST 4YDX4 CTTN HI CHSV (CAST SUPPLIES) ×1 IMPLANT
PADDING CAST COTTON 4X4 STRL (CAST SUPPLIES) ×6
SET HNDPC FAN SPRY TIP SCT (DISPOSABLE) IMPLANT
SPLINT FIBERGLASS 4X30 (CAST SUPPLIES) ×2 IMPLANT
SPONGE GAUZE 4X4 12PLY (GAUZE/BANDAGES/DRESSINGS) ×5 IMPLANT
SPONGE GAUZE 4X4 12PLY STER LF (GAUZE/BANDAGES/DRESSINGS) ×2 IMPLANT
SPONGE LAP 18X18 X RAY DECT (DISPOSABLE) ×3 IMPLANT
SPONGE LAP 4X18 X RAY DECT (DISPOSABLE) ×1 IMPLANT
SUT PROLENE 4 0 PS 2 18 (SUTURE) ×4 IMPLANT
SYR CONTROL 10ML LL (SYRINGE) ×2 IMPLANT
TOWEL OR 17X24 6PK STRL BLUE (TOWEL DISPOSABLE) ×3 IMPLANT
TOWEL OR 17X26 10 PK STRL BLUE (TOWEL DISPOSABLE) ×3 IMPLANT
TUBE ANAEROBIC SPECIMEN COL (MISCELLANEOUS) IMPLANT
TUBE CONNECTING 12'X1/4 (SUCTIONS) ×1
TUBE CONNECTING 12X1/4 (SUCTIONS) ×2 IMPLANT
WATER STERILE IRR 1000ML POUR (IV SOLUTION) ×1 IMPLANT
YANKAUER SUCT BULB TIP NO VENT (SUCTIONS) ×1 IMPLANT

## 2013-08-05 NOTE — Progress Notes (Signed)
[facility]                                                                              Patient Demographics  [name], is a 53 y.o. female, DOB - [birth year], GHW:[id number]  Admit date - 08/03/2013   Admitting Physician Roseanne Kaufman, MD  Outpatient Primary MD for the patient is Lanette Hampshire, MD  LOS - 2   Chief Complaint  Patient presents with  . Cellulitis      HPI  53 year old female who presented to the [facility] ED with complaints of increased redness and swelling and pain of her right hand x 3 days. She reports falling onto her Right hand 3 days ago. She reports that the redness was spreading up her forearm. She was evaluated and transferred to [facility] for surgical evaluation by Dr. Amedeo Plenty and underwent an I+D for compartment Syndrome of the Right hand and Forearm. She was placed on IV antibiotic Therapy of Vancomycin and Fortaz. [facility] were consulted for medical management of her medical conditions.    Assessment & Plan  Compartment syndrome  -Orthopedic surgery, Dr. Amedeo Plenty, following  -Underwent Irrigation and debridement of the right hand and forearm and Fasciotomy on 4/11 -Underwent repeat I&D today by Dr. Amedeo Plenty -Continue pain control   Cellulitis of the right hand and forearm  -Continue vancomycin and Fortaz   Chronic pain syndrome  -Multifactorial: Osteoarthropathy versus cervical disc disease and lumbago  -Continue Cymbalta and hopefully pain regimen   Benzodiazepine addiction  -Continue Xanax twice a day to prevent withdrawal   Hypertension  -Continue triamterene and HCTZ   Peptic ulcer disease  -Continue PPI   Bipolar disorder  -Currently on no medications   Depression and anxiety  -Continue duloxetine   Tobacco abuse  -Continue nicotine patch   Hypokalemia  -Likely secondary to diuretics  -Replaced, will continue to monitor BMP  Code Status: Full  Family Communication: None at bedside  Disposition Plan:  admitted   Time Spent in minutes 25 minutes   Procedures  Irrigation and debridement of the right hand and forearm (x2) Fasciotomy   Consults  TRH   DVT Prophylaxis SCDs   Lab Results  Component Value Date   PLT 264 08/05/2013    Medications  Scheduled Meds: . [MAR HOLD] DULoxetine  60 mg Oral QHS  . HYDROmorphone      . HYDROmorphone      . ketorolac      . [facility] HOLD] nicotine  7 mg Transdermal Daily  . oxyCODONE      . [MAR HOLD] pantoprazole  40 mg Oral Daily  . [MAR HOLD] piperacillin-tazobactam (ZOSYN)  IV  3.375 g Intravenous Q8H  . [MAR HOLD] triamterene-hydrochlorothiazide  1 tablet Oral q morning - 10a  . [facility] HOLD] vancomycin  1,000 mg Intravenous Q12H  . [facility] HOLD] vitamin C  1,000 mg Oral Daily   Continuous Infusions: . lactated ringers     PRN Meds:.acetaminophen (TYLENOL) oral liquid 160 mg/5 mL, acetaminophen, [MAR HOLD] ALPRAZolam, [MAR HOLD] cyclobenzaprine, HYDROmorphone (DILAUDID) injection, [MAR HOLD]  HYDROmorphone (DILAUDID) injection, methocarbamol (ROBAXIN) IV, [MAR HOLD] nitroGLYCERIN, [MAR HOLD] ondansetron (ZOFRAN) IV, [MAR HOLD] ondansetron, [  MAR HOLD] oxyCODONE, promethazine, [MAR HOLD] promethazine  Antibiotics    Anti-infectives   Start     Dose/Rate Route Frequency Ordered Stop   08/05/13 0600  vancomycin (VANCOCIN) IVPB 1000 mg/200 mL premix     1,000 mg 200 mL/hr over 60 Minutes Intravenous On call to O.R. 08/04/13 2009 08/05/13 1100   08/03/13 2300  [MAR Hold]  vancomycin (VANCOCIN) IVPB 1000 mg/200 mL premix     (On MAR Hold since 08/05/13 0824)   1,000 mg 200 mL/hr over 60 Minutes Intravenous Every 12 hours 08/03/13 2220     08/03/13 2300  [MAR Hold]  piperacillin-tazobactam (ZOSYN) IVPB 3.375 g     (On MAR Hold since 08/05/13 0824)   3.375 g 12.5 mL/hr over 240 Minutes Intravenous Every 8 hours 08/03/13 2221     08/03/13 2200  vancomycin (VANCOCIN) IVPB 1000 mg/200 mL premix  Status:  Discontinued     1,000 mg 200 mL/hr over 60  Minutes Intravenous  Once 08/03/13 2158 08/03/13 2221   08/03/13 2200  piperacillin-tazobactam (ZOSYN) IVPB 3.375 g  Status:  Discontinued     3.375 g 100 mL/hr over 30 Minutes Intravenous  Once 08/03/13 2158 08/03/13 2221      Subjective:   [name] seen and examined today. Patient states she is feeling better today than yesterday.  She is hungry.  She has no complaints of chest pain, shortness of breath, abdominal pain.  Objective:   Filed Vitals:   08/05/13 1015 08/05/13 1030 08/05/13 1045 08/05/13 1100  BP: 142/64 122/73 128/79 124/56  Pulse: 82 78 73 71  Temp: 97.9 F (36.6 C)     TempSrc:      Resp: 10 12 13 12   Height:      Weight:      SpO2: 97% 96% 96% 94%    Wt Readings from Last 3 Encounters:  08/03/13 81.647 kg (180 lb)  08/03/13 81.647 kg (180 lb)  08/03/13 81.647 kg (180 lb)     Intake/Output Summary (Last 24 hours) at 08/05/13 1134 Last data filed at 08/05/13 1022  Gross per 24 hour  Intake   2455 ml  Output     25 ml  Net   2430 ml    Exam  General: Well developed, well nourished, NAD, appears stated age  HEENT: NCAT, PERRLA, EOMI, Anicteic Sclera, mucous membranes moist.   Neck: Supple, no JVD, no masses  Cardiovascular: S1 S2 auscultated, no rubs, murmurs or gallops. Regular rate and rhythm.  Respiratory: Clear to auscultation bilaterally with equal chest rise  Abdomen: Soft, nontender, nondistended, + bowel sounds  Extremities: warm dry without cyanosis clubbing or edema, left forearm wrapped in ACE bandage, wound vac in placce  Neuro: AAOx3, cranial nerves grossly intact. Decreased sensation in right fingers.    Skin: Without rashes exudates or nodules  Psych: Normal affect and demeanor with intact judgement and insight    Data Review   Micro Results Recent Results (from the past 240 hour(s))  CULTURE, BLOOD (ROUTINE X 2)     Status: None   Collection Time    08/03/13  9:30 PM      Result Value Ref Range Status    Specimen Description RIGHT ANTECUBITAL   Final   Special Requests BOTTLES DRAWN AEROBIC AND ANAEROBIC 8CC   Final   Culture NO GROWTH 2 DAYS   Final   Report Status PENDING   Incomplete  CULTURE, BLOOD (ROUTINE X 2)     Status: None  Collection Time    08/03/13  9:35 PM      Result Value Ref Range Status   Specimen Description BLOOD LEFT WRIST   Final   Special Requests BOTTLES DRAWN AEROBIC ONLY 6CC   Final   Culture NO GROWTH 2 DAYS   Final   Report Status PENDING   Incomplete  ANAEROBIC CULTURE     Status: None   Collection Time    08/04/13  2:02 AM      Result Value Ref Range Status   Specimen Description WOUND RIGHT ARM   Final   Special Requests PATIENT ON FOLLOWING VANCOMYCIN AND ZOCYN   Final   Gram Stain     Final   Value: NO WBC SEEN     RARE SQUAMOUS EPITHELIAL CELLS PRESENT     NO ORGANISMS SEEN     Performed at [facility]   Culture     Final   Value: NO ANAEROBES ISOLATED; CULTURE IN PROGRESS FOR 5 DAYS     Performed at [facility]   Report Status PENDING   Incomplete  WOUND CULTURE     Status: None   Collection Time    08/04/13  2:02 AM      Result Value Ref Range Status   Specimen Description WOUND RIGHT ARM   Final   Special Requests PATIENT ON FOLLOWING VANCOMYCIN AND ZOCYN   Final   Gram Stain     Final   Value: NO WBC SEEN     NO SQUAMOUS EPITHELIAL CELLS SEEN     NO ORGANISMS SEEN     Performed at [facility]   Culture     Final   Value: NO GROWTH 1 DAY     Performed at [facility]   Report Status PENDING   Incomplete    Radiology Reports Dg Elbow Complete Right  07/11/2013   CLINICAL DATA:  [place].  Elbow pain.  EXAM: RIGHT ELBOW - COMPLETE 3+ VIEW  FINDINGS: The joint spaces are maintained. No acute fracture or joint effusion. No osteochondral abnormality.  IMPRESSION: No acute bony findings or joint effusion.   Electronically Signed   By: Kalman Jewels M.D.   On: 07/11/2013 17:25   Dg Forearm  Right  08/03/2013   CLINICAL DATA:  CELLULITIS  EXAM: RIGHT WRIST - COMPLETE 3+ VIEW; RIGHT HAND - COMPLETE 3+ VIEW; RIGHT FOREARM - 2 VIEW  COMPARISON:  DG ELBOW COMPLETE*R* dated 07/11/2013  FINDINGS: There is no evidence of fracture or dislocation. There is no evidence of arthropathy or other focal bone abnormality. Wrist and hand soft tissue swelling. Anterior forearm skin irregularity without subcutaneous gas or radiopaque foreign bodies.  IMPRESSION: No fracture deformity,, dislocation or destructive bony lesions.  Soft tissue swelling of the anterior forearm skin irregularity, no subcutaneous gas or radiopaque foreign bodies.   Electronically Signed   By: Elon Alas   On: 08/03/2013 20:26   Dg Wrist Complete Right  08/03/2013   CLINICAL DATA:  CELLULITIS  EXAM: RIGHT WRIST - COMPLETE 3+ VIEW; RIGHT HAND - COMPLETE 3+ VIEW; RIGHT FOREARM - 2 VIEW  COMPARISON:  DG ELBOW COMPLETE*R* dated 07/11/2013  FINDINGS: There is no evidence of fracture or dislocation. There is no evidence of arthropathy or other focal bone abnormality. Wrist and hand soft tissue swelling. Anterior forearm skin irregularity without subcutaneous gas or radiopaque foreign bodies.  IMPRESSION: No fracture deformity,, dislocation or destructive bony lesions.  Soft tissue swelling of the anterior  forearm skin irregularity, no subcutaneous gas or radiopaque foreign bodies.   Electronically Signed   By: Elon Alas   On: 08/03/2013 20:26   Ct Head Wo Contrast  07/11/2013   CLINICAL DATA:  Neck pain.  Trauma.  Headache.  EXAM: CT HEAD WITHOUT CONTRAST  CT CERVICAL SPINE WITHOUT CONTRAST  TECHNIQUE: Multidetector CT imaging of the head and cervical spine was performed following the standard protocol without intravenous contrast. Multiplanar CT image reconstructions of the cervical spine were also generated.  COMPARISON:  06/01/2005  FINDINGS: CT HEAD FINDINGS  The brain has normal appearance without evidence of old or acute  infarction, mass lesion, hemorrhage, hydrocephalus or extra-axial collection. No skull fracture. No fluid in the sinuses.  CT CERVICAL SPINE FINDINGS  Alignment is normal. No fracture or soft tissue swelling. 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 No stenosis of the canal or foramina identified. Lung apices show abnormal density possibly related to respiratory bronchiolitis. Not completely evaluated.  IMPRESSION: Head CT:  Normal.  Cervical spine CT: Minimal degenerative changes. No acute or traumatic finding.   Electronically Signed   By: Nelson Chimes M.D.   On: 07/11/2013 16:51   Ct Cervical Spine Wo Contrast  07/11/2013   CLINICAL DATA:  Neck pain.  Trauma.  Headache.  EXAM: CT HEAD WITHOUT CONTRAST  CT CERVICAL SPINE WITHOUT CONTRAST  TECHNIQUE: Multidetector CT imaging of the head and cervical spine was performed following the standard protocol without intravenous contrast. Multiplanar CT image reconstructions of the cervical spine were also generated.  COMPARISON:  06/01/2005  FINDINGS: CT HEAD FINDINGS  The brain has normal appearance without evidence of old or acute infarction, mass lesion, hemorrhage, hydrocephalus or extra-axial collection. No skull fracture. No fluid in the sinuses.  CT CERVICAL SPINE FINDINGS  Alignment is normal. No fracture or soft tissue swelling. 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 No stenosis of the canal or foramina identified. Lung apices show abnormal density possibly related to respiratory bronchiolitis. Not completely evaluated.  IMPRESSION: Head CT:  Normal.  Cervical spine CT: Minimal degenerative changes. No acute or traumatic finding.   Electronically Signed   By: Nelson Chimes M.D.   On: 07/11/2013 16:51   Dg Hand Complete Right  08/03/2013   CLINICAL DATA:  CELLULITIS  EXAM: RIGHT WRIST - COMPLETE 3+ VIEW; RIGHT HAND - COMPLETE 3+ VIEW; RIGHT FOREARM - 2 VIEW  COMPARISON:  DG ELBOW COMPLETE*R* dated 07/11/2013  FINDINGS:  There is no evidence of fracture or dislocation. There is no evidence of arthropathy or other focal bone abnormality. Wrist and hand soft tissue swelling. Anterior forearm skin irregularity without subcutaneous gas or radiopaque foreign bodies.  IMPRESSION: No fracture deformity,, dislocation or destructive bony lesions.  Soft tissue swelling of the anterior forearm skin irregularity, no subcutaneous gas or radiopaque foreign bodies.   Electronically Signed   By: Elon Alas   On: 08/03/2013 20:26    CBC  Recent Labs Lab 08/03/13 2130 08/05/13 0630  WBC 12.0* 8.2  HGB 13.8 13.2  HCT 40.4 39.2  PLT 256 264  MCV 88.4 90.1  MCH 30.2 30.3  MCHC 34.2 33.7  RDW 14.8 15.0  LYMPHSABS 2.2 2.3  MONOABS 1.0 0.7  EOSABS 0.3 0.5  BASOSABS 0.0 0.0    Chemistries   Recent Labs Lab 08/03/13 2130 08/05/13 0630  NA 135* 140  K 3.1* 3.4*  CL 96 100  CO2 27 28  GLUCOSE 127* 100*  BUN 14 8  CREATININE 0.77 0.71  CALCIUM 8.8 8.8   ------------------------------------------------------------------------------------------------------------------ estimated creatinine clearance is 83.1 ml/min (by C-G formula based on Cr of 0.71). ------------------------------------------------------------------------------------------------------------------ No results found for this basename: HGBA1C,  in the last 72 hours ------------------------------------------------------------------------------------------------------------------ No results found for this basename: CHOL, HDL, LDLCALC, TRIG, CHOLHDL, LDLDIRECT,  in the last 72 hours ------------------------------------------------------------------------------------------------------------------ No results found for this basename: TSH, T4TOTAL, FREET3, T3FREE, THYROIDAB,  in the last 72 hours ------------------------------------------------------------------------------------------------------------------ No results found for this basename:  VITAMINB12, FOLATE, FERRITIN, TIBC, IRON, RETICCTPCT,  in the last 72 hours  Coagulation profile No results found for this basename: INR, PROTIME,  in the last 168 hours  No results found for this basename: DDIMER,  in the last 72 hours  Cardiac Enzymes No results found for this basename: CK, CKMB, TROPONINI, MYOGLOBIN,  in the last 168 hours ------------------------------------------------------------------------------------------------------------------ No components found with this basename: POCBNP,     Lyssa Hackley D.O. on 08/05/2013 at 11:34 AM  Between 7am to 7pm - Pager - [phone]  After 7pm go to [email or web address] - password [email or web address]  And look for the night coverage person covering for me after hours  [facility] Office  [phone]

## 2013-08-05 NOTE — Anesthesia Preprocedure Evaluation (Addendum)
Anesthesia Evaluation  Patient identified by MRN, date of birth, ID band Patient awake    Reviewed: Allergy & Precautions, H&P , NPO status , Patient's Chart, lab work & pertinent test results  Airway Mallampati: II TM Distance: >3 FB Neck ROM: Full    Dental  (+) Teeth Intact   Pulmonary neg shortness of breath, neg sleep apnea, neg COPDneg recent URI, Current Smoker,  breath sounds clear to auscultation        Cardiovascular hypertension, Pt. 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   Dental advisory given  Plan Discussed with: CRNA and Surgeon  Anesthesia Plan Comments:         Anesthesia Quick Evaluation

## 2013-08-05 NOTE — Transfer of Care (Signed)
Immediate Anesthesia Transfer of Care Note  Patient: [name]  Procedure(s) Performed: Procedure(s): IRRIGATION AND DEBRIDEMENT RIGHT HAND AND FOREARM SECONDARY CLOSURE OF (Right)  Patient Location: PACU  Anesthesia Type:General  Level of Consciousness: awake, alert , oriented and patient cooperative  Airway & Oxygen Therapy: Patient Spontanous Breathing and Patient connected to nasal cannula oxygen  Post-op Assessment: Report given to PACU RN, Post -op Vital signs reviewed and stable and Patient moving all extremities  Post vital signs: Reviewed and stable  Complications: No apparent anesthesia complications

## 2013-08-05 NOTE — Anesthesia Postprocedure Evaluation (Signed)
  Anesthesia Post-op Note  Patient: [name]  Procedure(s) Performed: Procedure(s): IRRIGATION AND DEBRIDEMENT RIGHT HAND AND FOREARM with SECONDARY CLOSURE  (Right)  Patient Location: PACU  Anesthesia Type:General  Level of Consciousness: awake and alert   Airway and Oxygen Therapy: Patient Spontanous Breathing  Post-op Pain: mild  Post-op Assessment: Post-op Vital signs reviewed, Patient's Cardiovascular Status Stable, Respiratory Function Stable, Patent Airway, No signs of Nausea or vomiting and Pain level controlled  Post-op Vital Signs: Reviewed and stable  Last Vitals:  Filed Vitals:   08/05/13 1136  BP: 95/76  Pulse: 70  Temp: 36.4 C  Resp: 16    Complications: No apparent anesthesia complications

## 2013-08-05 NOTE — Op Note (Signed)
See Dictation #[id number] Amedeo Plenty Md

## 2013-08-05 NOTE — Anesthesia Procedure Notes (Signed)
Procedure Name: LMA Insertion Date/Time: 08/05/2013 8:45 AM Performed by: Williemae Area B Pre-anesthesia Checklist: Patient identified, Emergency Drugs available, Suction available and Patient being monitored Patient Re-evaluated:Patient Re-evaluated prior to inductionOxygen Delivery Method: Circle system utilized Preoxygenation: Pre-oxygenation with 100% oxygen Intubation Type: IV induction Ventilation: Mask ventilation without difficulty LMA: LMA inserted LMA Size: 4.0 Number of attempts: 1 ETT to lip (cm): taped across cheeks; gauze roll b/t teeth. Tube secured with: Tape Dental Injury: Teeth and Oropharynx as per pre-operative assessment

## 2013-08-05 NOTE — H&P (Signed)
  Patient ready to proceed to the OR Spent 30 minutes discussing the issues and concerns and plans Many challenges ahead Still lacks sensation but the nerves are decompressed and the compartments are released with good refill and better flexion but still with poor extension Guarded prognosis Will proceed with repeat I&D Vinay Ertl MD

## 2013-08-05 NOTE — Progress Notes (Signed)
Orthopedic Tech Progress Note Patient Details:  [name] 1960-07-24 [id number]  Ortho Devices Ortho Device/Splint Location: rue (elevator sling) Ortho Device/Splint Interventions: Application   Hildred Priest 08/05/2013, 10:11 PM

## 2013-08-06 ENCOUNTER — Encounter (HOSPITAL_COMMUNITY): Payer: Self-pay | Admitting: Orthopedic Surgery

## 2013-08-06 LAB — WOUND CULTURE
Culture: NO GROWTH
GRAM STAIN: NONE SEEN

## 2013-08-06 LAB — CBC WITH DIFFERENTIAL/PLATELET
BASOS PCT: 0 % (ref 0–1)
Basophils Absolute: 0 10*3/uL (ref 0.0–0.1)
EOS ABS: 0.4 10*3/uL (ref 0.0–0.7)
EOS PCT: 5 % (ref 0–5)
HCT: 37.1 % (ref 36.0–46.0)
Hemoglobin: 12.4 g/dL (ref 12.0–15.0)
LYMPHS ABS: 2.1 10*3/uL (ref 0.7–4.0)
Lymphocytes Relative: 29 % (ref 12–46)
MCH: 30.2 pg (ref 26.0–34.0)
MCHC: 33.4 g/dL (ref 30.0–36.0)
MCV: 90.5 fL (ref 78.0–100.0)
Monocytes Absolute: 0.6 10*3/uL (ref 0.1–1.0)
Monocytes Relative: 8 % (ref 3–12)
Neutro Abs: 4.2 10*3/uL (ref 1.7–7.7)
Neutrophils Relative %: 58 % (ref 43–77)
PLATELETS: 256 10*3/uL (ref 150–400)
RBC: 4.1 MIL/uL (ref 3.87–5.11)
RDW: 14.6 % (ref 11.5–15.5)
WBC: 7.4 10*3/uL (ref 4.0–10.5)

## 2013-08-06 LAB — BASIC METABOLIC PANEL
BUN: 10 mg/dL (ref 6–23)
CALCIUM: 8.5 mg/dL (ref 8.4–10.5)
CO2: 28 mEq/L (ref 19–32)
Chloride: 98 mEq/L (ref 96–112)
Creatinine, Ser: 0.88 mg/dL (ref 0.50–1.10)
GFR calc Af Amer: 85 mL/min — ABNORMAL LOW (ref >90)
GFR calc non Af Amer: 73 mL/min — ABNORMAL LOW (ref >90)
GLUCOSE: 99 mg/dL (ref 70–99)
Potassium: 4.3 mEq/L (ref 3.7–5.3)
SODIUM: 138 meq/L (ref 137–147)

## 2013-08-06 LAB — VANCOMYCIN, TROUGH: Vancomycin Tr: 15.1 ug/mL (ref 10.0–20.0)

## 2013-08-06 MED ORDER — CHLORHEXIDINE GLUCONATE 4 % EX LIQD
60.0000 mL | Freq: Once | CUTANEOUS | Status: AC
  Administered 2013-08-07: 4 via TOPICAL
  Filled 2013-08-06: qty 60

## 2013-08-06 MED ORDER — VANCOMYCIN HCL IN DEXTROSE 1-5 GM/200ML-% IV SOLN
1000.0000 mg | INTRAVENOUS | Status: AC
  Administered 2013-08-07: 1000 mg via INTRAVENOUS
  Filled 2013-08-06: qty 200

## 2013-08-06 NOTE — Progress Notes (Signed)
[facility]                                                                              Patient Demographics  [name], is a 53 y.o. female, DOB - Oct 02, 1960, [id number]  Admit date - 08/03/2013   Admitting Physician Roseanne Kaufman, MD  Outpatient Primary MD for the patient is Lanette Hampshire, MD  LOS - 3   Chief Complaint  Patient presents with  . Cellulitis      HPI  53 year old female who presented to the [facility] ED with complaints of increased redness and swelling and pain of her right hand x 3 days. She reports falling onto her Right hand 3 days ago. She reports that the redness was spreading up her forearm. She was evaluated and transferred to [facility] for surgical evaluation by Dr. Amedeo Plenty and underwent an I+D for compartment Syndrome of the Right hand and Forearm. She was placed on IV antibiotic Therapy of Vancomycin and Fortaz. [facility] were consulted for medical management of her medical conditions.    Assessment & Plan  Compartment syndrome  -Orthopedic surgery, Dr. Amedeo Plenty, following  -Underwent Irrigation and debridement of the right hand and forearm and Fasciotomy on 4/11 -Underwent repeat I&D today by Dr. Amedeo Plenty -Continue pain control  -Will likely need skin graft next week?  Cellulitis of the right hand and forearm  -Continue vancomycin and zosyn  Chronic pain syndrome  -Multifactorial: Osteoarthropathy versus cervical disc disease and lumbago  -Continue Cymbalta and hopefully pain regimen   Benzodiazepine addiction  -Continue Xanax twice a day PRN to prevent withdrawal   Hypertension  -Continue triamterene and HCTZ   Peptic ulcer disease  -Continue PPI   Bipolar disorder  -Currently on no medications   Depression and anxiety  -Continue duloxetine   Tobacco abuse  -Continue nicotine patch   Hypokalemia  -Resolved, Likely secondary to diuretics  -Replaced, will continue to monitor BMP   Code Status: Full    Family Communication: None at bedside   Disposition Plan: admitted   Time Spent in minutes 25 minutes   Procedures  Irrigation and debridement of the right hand and forearm (x2) Fasciotomy   Consults  TRH   DVT Prophylaxis SCDs   Lab Results  Component Value Date   PLT 256 08/06/2013    Medications  Scheduled Meds: . DULoxetine  60 mg Oral QHS  . nicotine  7 mg Transdermal Daily  . pantoprazole  40 mg Oral Daily  . piperacillin-tazobactam (ZOSYN)  IV  3.375 g Intravenous Q8H  . triamterene-hydrochlorothiazide  1 tablet Oral q morning - 10a  . vancomycin  1,000 mg Intravenous Q12H  . vitamin C  1,000 mg Oral Daily   Continuous Infusions: . lactated ringers     PRN Meds:.ALPRAZolam, cyclobenzaprine, HYDROmorphone (DILAUDID) injection, HYDROmorphone, methocarbamol (ROBAXIN) IV, nitroGLYCERIN, ondansetron (ZOFRAN) IV, ondansetron, oxyCODONE, promethazine  Antibiotics    Anti-infectives   Start     Dose/Rate Route Frequency Ordered Stop   08/05/13 0600  vancomycin (VANCOCIN) IVPB 1000 mg/200 mL premix     1,000 mg 200 mL/hr over 60 Minutes Intravenous On call to O.R. 08/04/13 2009 08/05/13 1100  08/03/13 2300  vancomycin (VANCOCIN) IVPB 1000 mg/200 mL premix     1,000 mg 200 mL/hr over 60 Minutes Intravenous Every 12 hours 08/03/13 2220     08/03/13 2300  piperacillin-tazobactam (ZOSYN) IVPB 3.375 g     3.375 g 12.5 mL/hr over 240 Minutes Intravenous Every 8 hours 08/03/13 2221     08/03/13 2200  vancomycin (VANCOCIN) IVPB 1000 mg/200 mL premix  Status:  Discontinued     1,000 mg 200 mL/hr over 60 Minutes Intravenous  Once 08/03/13 2158 08/03/13 2221   08/03/13 2200  piperacillin-tazobactam (ZOSYN) IVPB 3.375 g  Status:  Discontinued     3.375 g 100 mL/hr over 30 Minutes Intravenous  Once 08/03/13 2158 08/03/13 2221      Subjective:   Humberto Seals seen and examined today. Patient states she is very sleepy and her hand still hurts, feels her "fingers  have blown off." Denies chest pain, dizziness, shortness of breath.  Objective:   Filed Vitals:   08/05/13 1115 08/05/13 1136 08/05/13 2131 08/06/13 0706  BP: 108/58 95/76 109/60 119/46  Pulse: 68 70 65 83  Temp:  97.6 F (36.4 C) 98 F (36.7 C) 98.3 F (36.8 C)  TempSrc:  Oral Oral Oral  Resp: 11 16 16 16   Height:      Weight:      SpO2: 95% 93% 96% 95%    Wt Readings from Last 3 Encounters:  08/03/13 81.647 kg (180 lb)  08/03/13 81.647 kg (180 lb)  08/03/13 81.647 kg (180 lb)     Intake/Output Summary (Last 24 hours) at 08/06/13 0830 Last data filed at 08/06/13 0710  Gross per 24 hour  Intake   1595 ml  Output     25 ml  Net   1570 ml    Exam  General: Well developed, well nourished, NAD, appears stated age  HEENT: NCAT, PERRLA, EOMI, Anicteic Sclera, mucous membranes moist.   Neck: Supple, no JVD, no masses  Cardiovascular: S1 S2 auscultated, no rubs, murmurs or gallops. Regular rate and rhythm.  Respiratory: Clear to auscultation bilaterally with equal chest rise  Abdomen: Soft, nontender, nondistended, + bowel sounds  Extremities: warm dry without cyanosis clubbing or edema, left forearm wrapped in ACE bandage, wound vac in place  Neuro: AAOx3, cranial nerves grossly intact. Decreased sensation in right fingers.    Skin: Without rashes exudates or nodules  Psych: Normal affect and demeanor with intact judgement and insight    Data Review   Micro Results Recent Results (from the past 240 hour(s))  CULTURE, BLOOD (ROUTINE X 2)     Status: None   Collection Time    08/03/13  9:30 PM      Result Value Ref Range Status   Specimen Description RIGHT ANTECUBITAL   Final   Special Requests BOTTLES DRAWN AEROBIC AND ANAEROBIC 8CC   Final   Culture NO GROWTH 2 DAYS   Final   Report Status PENDING   Incomplete  CULTURE, BLOOD (ROUTINE X 2)     Status: None   Collection Time    08/03/13  9:35 PM      Result Value Ref Range Status   Specimen  Description BLOOD LEFT WRIST   Final   Special Requests BOTTLES DRAWN AEROBIC ONLY 6CC   Final   Culture NO GROWTH 2 DAYS   Final   Report Status PENDING   Incomplete  ANAEROBIC CULTURE     Status: None   Collection Time  08/04/13  2:02 AM      Result Value Ref Range Status   Specimen Description WOUND RIGHT ARM   Final   Special Requests PATIENT ON FOLLOWING VANCOMYCIN AND ZOCYN   Final   Gram Stain     Final   Value: NO WBC SEEN     RARE SQUAMOUS EPITHELIAL CELLS PRESENT     NO ORGANISMS SEEN     Performed at [facility]   Culture     Final   Value: NO ANAEROBES ISOLATED; CULTURE IN PROGRESS FOR 5 DAYS     Performed at [facility]   Report Status PENDING   Incomplete  WOUND CULTURE     Status: None   Collection Time    08/04/13  2:02 AM      Result Value Ref Range Status   Specimen Description WOUND RIGHT ARM   Final   Special Requests PATIENT ON FOLLOWING VANCOMYCIN AND ZOCYN   Final   Gram Stain     Final   Value: NO WBC SEEN     NO SQUAMOUS EPITHELIAL CELLS SEEN     NO ORGANISMS SEEN     Performed at [facility]   Culture     Final   Value: NO GROWTH 2 DAYS     Performed at [facility]   Report Status 08/06/2013 FINAL   Final    Radiology Reports Dg Elbow Complete Right  07/11/2013   CLINICAL DATA:  [place].  Elbow pain.  EXAM: RIGHT ELBOW - COMPLETE 3+ VIEW  FINDINGS: The joint spaces are maintained. No acute fracture or joint effusion. No osteochondral abnormality.  IMPRESSION: No acute bony findings or joint effusion.   Electronically Signed   By: Kalman Jewels M.D.   On: 07/11/2013 17:25   Dg Forearm Right  08/03/2013   CLINICAL DATA:  CELLULITIS  EXAM: RIGHT WRIST - COMPLETE 3+ VIEW; RIGHT HAND - COMPLETE 3+ VIEW; RIGHT FOREARM - 2 VIEW  COMPARISON:  DG ELBOW COMPLETE*R* dated 07/11/2013  FINDINGS: There is no evidence of fracture or dislocation. There is no evidence of arthropathy or other focal bone abnormality. Wrist and hand  soft tissue swelling. Anterior forearm skin irregularity without subcutaneous gas or radiopaque foreign bodies.  IMPRESSION: No fracture deformity,, dislocation or destructive bony lesions.  Soft tissue swelling of the anterior forearm skin irregularity, no subcutaneous gas or radiopaque foreign bodies.   Electronically Signed   By: Elon Alas   On: 08/03/2013 20:26   Dg Wrist Complete Right  08/03/2013   CLINICAL DATA:  CELLULITIS  EXAM: RIGHT WRIST - COMPLETE 3+ VIEW; RIGHT HAND - COMPLETE 3+ VIEW; RIGHT FOREARM - 2 VIEW  COMPARISON:  DG ELBOW COMPLETE*R* dated 07/11/2013  FINDINGS: There is no evidence of fracture or dislocation. There is no evidence of arthropathy or other focal bone abnormality. Wrist and hand soft tissue swelling. Anterior forearm skin irregularity without subcutaneous gas or radiopaque foreign bodies.  IMPRESSION: No fracture deformity,, dislocation or destructive bony lesions.  Soft tissue swelling of the anterior forearm skin irregularity, no subcutaneous gas or radiopaque foreign bodies.   Electronically Signed   By: Elon Alas   On: 08/03/2013 20:26   Ct Head Wo Contrast  07/11/2013   CLINICAL DATA:  Neck pain.  Trauma.  Headache.  EXAM: CT HEAD WITHOUT CONTRAST  CT CERVICAL SPINE WITHOUT CONTRAST  TECHNIQUE: Multidetector CT imaging of the head and cervical spine was performed following the standard protocol without  intravenous contrast. Multiplanar CT image reconstructions of the cervical spine were also generated.  COMPARISON:  06/01/2005  FINDINGS: CT HEAD FINDINGS  The brain has normal appearance without evidence of old or acute infarction, mass lesion, hemorrhage, hydrocephalus or extra-axial collection. No skull fracture. No fluid in the sinuses.  CT CERVICAL SPINE FINDINGS  Alignment is normal. No fracture or soft tissue swelling. 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 No stenosis of the canal or foramina identified. Lung apices show abnormal  density possibly related to respiratory bronchiolitis. Not completely evaluated.  IMPRESSION: Head CT:  Normal.  Cervical spine CT: Minimal degenerative changes. No acute or traumatic finding.   Electronically Signed   By: Nelson Chimes M.D.   On: 07/11/2013 16:51   Ct Cervical Spine Wo Contrast  07/11/2013   CLINICAL DATA:  Neck pain.  Trauma.  Headache.  EXAM: CT HEAD WITHOUT CONTRAST  CT CERVICAL SPINE WITHOUT CONTRAST  TECHNIQUE: Multidetector CT imaging of the head and cervical spine was performed following the standard protocol without intravenous contrast. Multiplanar CT image reconstructions of the cervical spine were also generated.  COMPARISON:  06/01/2005  FINDINGS: CT HEAD FINDINGS  The brain has normal appearance without evidence of old or acute infarction, mass lesion, hemorrhage, hydrocephalus or extra-axial collection. No skull fracture. No fluid in the sinuses.  CT CERVICAL SPINE FINDINGS  Alignment is normal. No fracture or soft tissue swelling. 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 No stenosis of the canal or foramina identified. Lung apices show abnormal density possibly related to respiratory bronchiolitis. Not completely evaluated.  IMPRESSION: Head CT:  Normal.  Cervical spine CT: Minimal degenerative changes. No acute or traumatic finding.   Electronically Signed   By: Nelson Chimes M.D.   On: 07/11/2013 16:51   Dg Hand Complete Right  08/03/2013   CLINICAL DATA:  CELLULITIS  EXAM: RIGHT WRIST - COMPLETE 3+ VIEW; RIGHT HAND - COMPLETE 3+ VIEW; RIGHT FOREARM - 2 VIEW  COMPARISON:  DG ELBOW COMPLETE*R* dated 07/11/2013  FINDINGS: There is no evidence of fracture or dislocation. There is no evidence of arthropathy or other focal bone abnormality. Wrist and hand soft tissue swelling. Anterior forearm skin irregularity without subcutaneous gas or radiopaque foreign bodies.  IMPRESSION: No fracture deformity,, dislocation or destructive bony lesions.  Soft tissue swelling of  the anterior forearm skin irregularity, no subcutaneous gas or radiopaque foreign bodies.   Electronically Signed   By: Elon Alas   On: 08/03/2013 20:26    CBC  Recent Labs Lab 08/03/13 2130 08/05/13 0630 08/06/13 0420  WBC 12.0* 8.2 7.4  HGB 13.8 13.2 12.4  HCT 40.4 39.2 37.1  PLT 256 264 256  MCV 88.4 90.1 90.5  MCH 30.2 30.3 30.2  MCHC 34.2 33.7 33.4  RDW 14.8 15.0 14.6  LYMPHSABS 2.2 2.3 2.1  MONOABS 1.0 0.7 0.6  EOSABS 0.3 0.5 0.4  BASOSABS 0.0 0.0 0.0    Chemistries   Recent Labs Lab 08/03/13 2130 08/05/13 0630 08/06/13 0420  NA 135* 140 138  K 3.1* 3.4* 4.3  CL 96 100 98  CO2 27 28 28   GLUCOSE 127* 100* 99  BUN 14 8 10   CREATININE 0.77 0.71 0.88  CALCIUM 8.8 8.8 8.5   ------------------------------------------------------------------------------------------------------------------ estimated creatinine clearance is 75.6 ml/min (by C-G formula based on Cr of 0.88). ------------------------------------------------------------------------------------------------------------------ No results found for this basename: HGBA1C,  in the last 72 hours ------------------------------------------------------------------------------------------------------------------ No results found for this basename: CHOL, HDL, LDLCALC, TRIG,  CHOLHDL, LDLDIRECT,  in the last 72 hours ------------------------------------------------------------------------------------------------------------------ No results found for this basename: TSH, T4TOTAL, FREET3, T3FREE, THYROIDAB,  in the last 72 hours ------------------------------------------------------------------------------------------------------------------ No results found for this basename: VITAMINB12, FOLATE, FERRITIN, TIBC, IRON, RETICCTPCT,  in the last 72 hours  Coagulation profile No results found for this basename: INR, PROTIME,  in the last 168 hours  No results found for this basename: DDIMER,  in the last 72  hours  Cardiac Enzymes No results found for this basename: CK, CKMB, TROPONINI, MYOGLOBIN,  in the last 168 hours ------------------------------------------------------------------------------------------------------------------ No components found with this basename: POCBNP,     Trayveon Beckford D.O. on 08/06/2013 at 8:30 AM  Between 7am to 7pm - Pager - [phone]  After 7pm go to [email or web address] - password [email or web address]  And look for the night coverage person covering for me after hours  [facility] Office  [phone]

## 2013-08-06 NOTE — Progress Notes (Signed)
ANTIBIOTIC CONSULT NOTE - FOLLOW UP  Pharmacy Consult for vancomycin Indication: cellulitis  Allergies  Allergen Reactions  . Vicodin [Hydrocodone-Acetaminophen] Itching    Patient Measurements: Height: 5\' 4"  (162.6 cm) Weight: 180 lb (81.647 kg) IBW/kg (Calculated) : 54.7 Adjusted Body Weight:   Vital Signs: Temp: 98.1 F (36.7 C) (04/13 2116) Temp src: Oral (04/13 2116) BP: 109/49 mmHg (04/13 2146) Pulse Rate: 58 (04/13 2116) Intake/Output from previous day: 04/12 0701 - 04/13 0700 In: 3474 [P.O.:240; I.V.:815; IV Piggyback:300] Out: 25 [Blood:25] Intake/Output from this shift:    Labs:  Recent Labs  08/05/13 0630 08/06/13 0420  WBC 8.2 7.4  HGB 13.2 12.4  PLT 264 256  CREATININE 0.71 0.88   Estimated Creatinine Clearance: 75.6 ml/min (by C-G formula based on Cr of 0.88).  Recent Labs  08/06/13 2102  [place] 15.1     Microbiology: Recent Results (from the past 720 hour(s))  URINE CULTURE     Status: None   Collection Time    07/11/13  6:15 PM      Result Value Ref Range Status   Specimen Description URINE, CLEAN CATCH   Final   Special Requests NONE   Final   Culture  Setup Time     Final   Value: 07/12/2013 01:53     Performed at [place]     Final   Value: NO GROWTH     Performed at [facility]   Culture     Final   Value: NO GROWTH     Performed at [facility]   Report Status 07/12/2013 FINAL   Final  CULTURE, BLOOD (ROUTINE X 2)     Status: None   Collection Time    08/03/13  9:30 PM      Result Value Ref Range Status   Specimen Description RIGHT ANTECUBITAL   Final   Special Requests BOTTLES DRAWN AEROBIC AND ANAEROBIC 8CC   Final   Culture NO GROWTH 3 DAYS   Final   Report Status PENDING   Incomplete  CULTURE, BLOOD (ROUTINE X 2)     Status: None   Collection Time    08/03/13  9:35 PM      Result Value Ref Range Status   Specimen Description BLOOD LEFT WRIST   Final   Special  Requests BOTTLES DRAWN AEROBIC ONLY 6CC   Final   Culture NO GROWTH 3 DAYS   Final   Report Status PENDING   Incomplete  ANAEROBIC CULTURE     Status: None   Collection Time    08/04/13  2:02 AM      Result Value Ref Range Status   Specimen Description WOUND RIGHT ARM   Final   Special Requests PATIENT ON FOLLOWING VANCOMYCIN AND ZOCYN   Final   Gram Stain     Final   Value: NO WBC SEEN     RARE SQUAMOUS EPITHELIAL CELLS PRESENT     NO ORGANISMS SEEN     Performed at [facility]   Culture     Final   Value: NO ANAEROBES ISOLATED; CULTURE IN PROGRESS FOR 5 DAYS     Performed at [facility]   Report Status PENDING   Incomplete  WOUND CULTURE     Status: None   Collection Time    08/04/13  2:02 AM      Result Value Ref Range Status   Specimen Description WOUND RIGHT ARM  Final   Special Requests PATIENT ON FOLLOWING VANCOMYCIN AND ZOCYN   Final   Gram Stain     Final   Value: NO WBC SEEN     NO SQUAMOUS EPITHELIAL CELLS SEEN     NO ORGANISMS SEEN     Performed at [facility]   Culture     Final   Value: NO GROWTH 2 DAYS     Performed at [facility]   Report Status 08/06/2013 FINAL   Final    Anti-infectives   Start     Dose/Rate Route Frequency Ordered Stop   08/07/13 0600  vancomycin (VANCOCIN) IVPB 1000 mg/200 mL premix    Comments:  Dose per pharmacy patient inpatient   1,000 mg 200 mL/hr over 60 Minutes Intravenous On call to O.R. 08/06/13 1715 08/08/13 0559   08/05/13 0600  vancomycin (VANCOCIN) IVPB 1000 mg/200 mL premix     1,000 mg 200 mL/hr over 60 Minutes Intravenous On call to O.R. 08/04/13 2009 08/05/13 1100   08/03/13 2300  vancomycin (VANCOCIN) IVPB 1000 mg/200 mL premix     1,000 mg 200 mL/hr over 60 Minutes Intravenous Every 12 hours 08/03/13 2220     08/03/13 2300  piperacillin-tazobactam (ZOSYN) IVPB 3.375 g     3.375 g 12.5 mL/hr over 240 Minutes Intravenous Every 8 hours 08/03/13 2221     08/03/13 2200  vancomycin  (VANCOCIN) IVPB 1000 mg/200 mL premix  Status:  Discontinued     1,000 mg 200 mL/hr over 60 Minutes Intravenous  Once 08/03/13 2158 08/03/13 2221   08/03/13 2200  piperacillin-tazobactam (ZOSYN) IVPB 3.375 g  Status:  Discontinued     3.375 g 100 mL/hr over 30 Minutes Intravenous  Once 08/03/13 2158 08/03/13 2221      Assessment: 53 yo female with cellulitis is currently on therapeutic vancomycin.  Vancomycin is 15.1.  Goal of Therapy:  Vancomycin trough level 10-15 mcg/ml  Plan:  1) Continue vancomycin 1g iv q12h.  [name] 08/06/2013,10:48 PM

## 2013-08-06 NOTE — Progress Notes (Signed)
Occupational Therapy Treatment Patient Details Name: [name] MRN: [id number] DOB: 1961/02/28 Today's Date: 08/06/2013    History of present illness 53 yo female s/p IRRIGATION AND DEBRIDEMENT RIGHT HAND AND FOREARM (Right)   OT comments  Pt seen today for ADL and therapeutic exercise. Pt continues to be limited by lethargy, possibly related to pain medications. Pt with impaired focus and fell asleep during PROM of R digits. Attempted to perform grooming and feeding activities to increase independence with ADLs, however attention was too limited to continue. Pt in bed for duration due to safety concerns. Pt would benefit from continued OT to increase independence prior to d/c home.    Follow Up Recommendations  Outpatient OT;Supervision/Assistance - 24 hour (when MD feels appropriate)    Equipment Recommendations  None recommended by OT       Precautions / Restrictions Restrictions Weight Bearing Restrictions: Yes RUE Weight Bearing: Non weight bearing              ADL Overall ADL's : Needs assistance/impaired Eating/Feeding: Set up;Sitting   Grooming: Minimal assistance;Sitting                                                  Cognition  Arousal/alertness: Lethargic Behavior During Therapy: Flat affect Overall Cognitive Status: No family/caregiver present to determine baseline cognitive functioning Area of Impairment: Safety/judgement;Awareness;Attention   Current Attention Level: Focused Memory: Decreased short-term memory;Decreased recall of precautions    Safety/Judgement: Decreased awareness of safety Awareness: Emergent   General Comments: Pt continuing to be lethargic and reports pain despite high levels of medication per RN. Required increased environmental stimuli to attend. Fell asleep during PROM of R fingers.      Exercises General Exercises - Upper Extremity Digit Composite Flexion: PROM;Right;10 reps;Supine Composite  Extension: PROM;Right;10 reps;Supine           Pertinent Vitals/ Pain       Pt reports pain in RUE but does not provide level or description.          Frequency Min 2X/week     Progress Toward Goals  OT Goals(current goals can now be found in the care plan section)  Progress towards OT goals: Progressing toward goals (slowly)     Plan Discharge plan remains appropriate       End of Session    Activity Tolerance Patient limited by lethargy   Patient Left in bed;with call bell/phone within reach   Nurse Communication          Time: 1130-1155 OT Time Calculation (min): 25 min  Charges: OT General Charges $OT Visit: 1 Procedure OT Treatments $Self Care/Home Management : 8-22 mins $Therapeutic Exercise: 8-22 mins  Juluis Rainier [phone] 08/06/2013, 4:28 PM

## 2013-08-06 NOTE — Progress Notes (Signed)
Subjective: 1 Day Post-Op Procedure(s) (LRB): IRRIGATION AND DEBRIDEMENT RIGHT HAND AND FOREARM with SECONDARY CLOSURE  (Right) Patient reports pain as controlled at this juncture. She still complains of numbness of the digits has expects. She still has limitation to range of motion. She nausea, vomiting, fever, chills or shortness of breath  Objective: Vital signs in last 24 hours: Temp:  [98 F (36.7 C)-98.3 F (36.8 C)] 98.3 F (36.8 C) (04/13 0706) Pulse Rate:  [65-83] 83 (04/13 0706) Resp:  [16] 16 (04/13 0706) BP: (109-119)/(46-60) 119/46 mmHg (04/13 0706) SpO2:  [95 %-96 %] 95 % (04/13 0706)  Intake/Output from previous day: 04/12 0701 - 04/13 0700 In: 1355 [P.O.:240; I.V.:815; IV Piggyback:300] Out: 25 [Blood:25] Intake/Output this shift: Total I/O In: 240 [P.O.:240] Out: -    Recent Labs  08/03/13 2130 08/05/13 0630 08/06/13 0420  HGB 13.8 13.2 12.4    Recent Labs  08/05/13 0630 08/06/13 0420  WBC 8.2 7.4  RBC 4.35 4.10  HCT 39.2 37.1  PLT 264 256    Recent Labs  08/05/13 0630 08/06/13 0420  NA 140 138  K 3.4* 4.3  CL 100 98  CO2 28 28  BUN 8 10  CREATININE 0.71 0.88  GLUCOSE 100* 99  CALCIUM 8.8 8.5   No results found for this basename: LABPT, INR,  in the last 72 hours  Evaluation of the right upper extremity shows she is currently using mission sling, have taking this down and elevated her hand across her abdomen with pillows. Have discussed with him once again it is imperative that she passively move her fingers as well as make attempts at active range of motion has difficulties with extension her flexion is improving. Gross sensation is diminished given her compartment syndrome no signs of ascending cellulitis are present  Assessment/Plan: 1 Day Post-Op Procedure(s) (LRB): IRRIGATION AND DEBRIDEMENT RIGHT HAND AND FOREARM with SECONDARY CLOSURE  (Right) Have discussed at length with the patient our plan to proceed to the operating room  tomorrow for a repeat irrigation and debridement as well as possible wound closure and possible full-thickness skin graft from the hip. We've discussed with her all issues risk and benefits.   Ivan Croft 08/06/2013, 2:09 PM

## 2013-08-06 NOTE — Op Note (Signed)
[name], [name]              ACCOUNT NO.:  [id number]  MEDICAL RECORD NO.:  [id number]  LOCATION:                                 FACILITY:  PHYSICIAN:  Satira Anis. Jonel Weldon, M.D.DATE OF BIRTH:  Dec 15, 1960  DATE OF PROCEDURE:  08/03/2013 DATE OF DISCHARGE:                              OPERATIVE REPORT   PREOPERATIVE DIAGNOSIS:  Status post fall with ascending erythema, cellulitis, and evolving compartment syndrome.  Date of injury and known.  POSTOPERATIVE DIAGNOSIS:  Status post fall with ascending erythema, cellulitis, and evolving compartment syndrome.  Date of injury and known.  PROCEDURE: 1. Compartment release, right hand including thenar and hypothenar     regions. 2. Open carpal tunnel release. 3. Fasciotomy; forearm, volar, superficial, and deep compartments. 4. Irrigation and debridement of skin, subcutaneous tissue, muscle,     and tendon; right forearm. 5. Vacuum-assisted closure device placement, right forearm.  SURGEON:  Satira Anis. Amedeo Plenty, M.D.  ASSISTANT:  None.  COMPLICATIONS:  None.  ANESTHESIA:  General.  TOURNIQUET TIME:  Less than 30 minutes.  INDICATIONS:  This patient is a 53 year old female who appears slightly older than her stated age.  She complains of pain in her forearm and hand.  She cannot move her fingers.  She complains of numbness in the fingers.  She does have refill.  She gives the history of following days ago, but cannot give the specifics.  She is quite the poor historian. 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  Her medical history is well described in her chart, although she is quite the poor historian as mentioned.  I should note that this patient presented to [facility] this evening and was ultimately transferred down to [facility].  I discussed with Dr. Wilson Singer her care and recommended that she move towards [facility] as soon as possible.  She arrived at [facility] at midnight and we were  able to get her to the operative theater around 12:45.  The patient understands risk of bleeding, infection, anesthesia, damage to normal structures, and failure of surgery to accomplish its intended goals of relieving symptoms and restoring function.  She also understands that unfortunately her hand is in a very poor state of affairs.  I feel she has an involved compartment syndrome given the findings.  Fortunately she does have vascular integrity.  I feel that she has a very guarded prognosis given her poor history, chronic challenging medical issues, and dramatic presentation with blistering of the arm/forearm and hand as well as loss of movement in the fingers, pain, and dysfunction.  I have recommended hospitalist consult and discussed this with the hospitalist.  OPERATION IN DETAIL:  The patient seen by myself and Anesthesia.  Arm was marked.  Pre and postop check list complete.  She was taken to the operative arena, underwent general anesthetic.  Following this, laid supine, fully padded, prepped and draped in usual sterile fashion, Betadine scrub and paint.  Time-out was called.  Pre and postop checklist were secured a 2nd time and the operation commenced with sterile field being isolated and tourniquet being inflated.  At this time, I made an incision  over the thenar region, dissected down, and performed a thenar space release.  Following this I then performed a 2nd compartment release about the hypo thenar region of the hand.  The mid palm was soft, the dorsal interosseous muscle was soft.  Once this was done, we then turned attention towards the patient's remaining hand, I did not see any other tight compartments.  Thus a compartment release about the thenar and hypothenar spaces was accomplished about the right hand with bulging muscle.  The muscle had adequate integrity.  Following this, I performed a modified curvilinear incision about the forearm.  Dissection was  carried down.  Following this, we performed fasciotomy of the superficial and deep volar compartment.  Muscle egressed and was under tension.  I did perform cultures with aerobic and anaerobic cultures.  There was no foul-smelling material.  There was no evidence of a fascial necrotizing process.  There was no evidence of complete muscle death, although some muscle was certainly unaddressed and portions of the muscle had a little bit of early change in their color and consistency.  We will have to see how this does over the ensuing hours and days.  The fasciotomy was performed without difficulty.  The median nerve was intact.  There were no space-occupying lesions, and I thoroughly probed the superficial, volar, and deep volar compartments.  The dorsal forearm appeared to be stable after this was done.  Once this was complete and cultures were taken, we then performed incision of the carpal tunnel.  Dissection was carried down and a formal open carpal tunnel release was done.  This was performed by incising the fascia making sure that this way it was released and then making sure the dissection went distal to proximal until adequate room was allowed to pass blunt-tipped scissors about the transverse carpal ligament and portions of the antebrachial fascia.  This was done to my satisfaction. Thus carpal tunnel release, fasciotomy of the forearm (deep and superficial volar compartments), and fasciotomy of the hand was accomplished.  Once this was done, I then very carefully and cautiously placed 3 L of fluid through all areas.  Irrigation and debridement of skin, subcutaneous tissue, tendon, muscle, and associated structures. The patient tolerated this well.  Following 3 L of fluid, we then very carefully dressed the wound and I applied a vacuum-assisted closure device over the main forearm wound, which was approximately 6 inches.  I left this open.  I placed 1 stitch in the distal end of  the carpal tunnel incision and left the remainder of this incision open to allow for the egression of fluid.  I did the same with a thenar and hypothenar regions.  Xeroform was placed over these.  I have checked a vacuum- assisted closure device suction and it was looking excellent.  Following this, she was dressed with Neosporin of the skin followed by Webril, Kerlix, gauze, and a posterior plaster splint.  She will be admitted for IV antibiotics.  We will plan for vancomycin and Zosyn as well as Hospitalist consult.  She will return to the operative theater on Sunday.  I will still give her guarded prognosis, however, I did feel very good about the decompression tonight.  We will have to wait and see and take it day by day in terms of her recovery process.     Satira Anis. Amedeo Plenty, M.D.   ______________________________ Satira Anis. Amedeo Plenty, M.D.    [facility]  D:  08/04/2013  T:  08/04/2013  Job:  [id number] 

## 2013-08-06 NOTE — Op Note (Signed)
[name], [name]              ACCOUNT NO.:  [id number]  MEDICAL RECORD NO.:  [id number]  LOCATION:  RAD                           FACILITY:  [facility]  PHYSICIAN:  Satira Anis. Phyllicia Dudek, M.D.DATE OF BIRTH:  1961/01/31  DATE OF PROCEDURE: DATE OF DISCHARGE:                              OPERATIVE REPORT   +  PREOPERATIVE DIAGNOSES:  Right upper extremity compartment syndrome, status post fall with a delayed presentation and noted cellulitis, lymphangitis, delayed presentation.  The patient is status post initial I and D with carpal tunnel release, thenar and hypothenar compartment release as well as volar forearm release/forearm fasciotomies superficial and deep delayed.  The patient presents for repeat I and D and repair as necessary.  PROCEDURE: 1. Irrigation debridement of skin, subcutaneous tissue, muscle, right     forearm. 2. Incision and drainage, skin, subcutaneous tissue, and muscle thenar     space, hypothenar space and carpal canal region. 3. Delayed closure carpal tunnel release. 4. Delayed closure hypothenar fasciotomy. 5. Palmaris longus tenotomy. 6. Flexor carpi radialis tenolysis tenosynovectomy. 7. Manipulation under anesthesia.  Index, middle, and ring as well as     small finger, PIP, DIP, and MCP joint  SURGEON:  Satira Anis. Amedeo Plenty, MD.  ASSISTANTS:  None.  COMPLICATIONS:  None.  ANESTHESIA:  General without block anesthetic.  TOURNIQUET TIME:  Less than 20 minutes.  INDICATIONS:  This is a very unfortunate 53 year old female with multiple medical and emotional challenges.  She fell sometime which she can't recall.  This was 4-5 days ago.  She is very challenging in terms of her history and presentation as she did not recall all events. Nevertheless, she presents to [facility], Friday evening and was immediately transferred down to [facility] for my services.  She was seen and immediately prepped for surgery as it was notable that she had cellulitis in  her arm as well as what appeared to be a compartment syndrome.  At that time, she had a preop presentation of inability to move the hands, the fingers which were all held in a flexed position, and she would not move them whatsoever.  She did have refill, she had complete loss of sensation in all of the fingers.  The patient was taken to the operative arena, underwent fasciotomy about the forearm and hand. This is well described.  Postoperatively, she maintained vascularity to the hand.  She was able to flex at bedside postop day 1, the thumb, index, middle, ring, and small fingers but does continue to have poor extension.  She continues to have numbness in her hand as well.  She has a vacuum assisted closure device placed at the initial surgery.  It has been a long time, yesterday and today, counseling her and getting her move her fingers as best as possible.  She has sensation to pinprick about the fingers.  They are quite none.  The patient also has pain and unfortunately has a long-standing history of chronic pain and is in a pain management program.  I had multiple conversations with her in regard to this.  She does take Cymbalta as well as narcotic pain medicine.  She has never had neck or  back surgery.  She states she is not a candidate.  She states, she has a lot of emotional challenges in her past but she did not want to discuss.  I have discussed with the patient the issues.  At present time, the good news is she has well perfused hand.  The patient has a white count that is now normalized and she is responding to the antibiotics.  She unfortunately is insensate about the fingers and I have tested this to pinprick.  The patient does have flexion of the fingers, but extension is rather poor.  Her compartments were released, her hand is well perfused.  She has flexion but I do have great concerns about her future.  I have discussed with the patient our task is repeat I and  D and repair as necessary, possible loose closure of some of the fasciotomy sites.  I have discussed with anticipate likely need for split-thickness skin graft performed sometime next week.  OPERATIVE PROCEDURE IN DETAIL:  The patient was seen by myself and Anesthesia, taken to operative suite, underwent smooth induction of general anesthetic.  She was laid supine, I very carefully examined the arm, the cellulitis was much improved.  She was still swollen as expected but the ascending cellulitis, erythema and lymphangitis were notably better, I removed a vacuum assisted closure device.  The thenar space had significant muscle bulging as did the volar forearm.  The dorsal forearm was very soft as it has been.  The patient's carpal tunnel site and hypothenar space was very soft and could be closed today.  This all was very unexpected given the initial intraoperative findings.  At this time, we prepped and draped her with Betadine scrub and paint. Following this, the arm was elevated, and I examined it once again. Under sterile field after time-out was called, I performed a manipulation under anesthesia of the DIP, PIP, and MCP joints of the index finger.  Following this, I performed a DIP, PIP, and MCP manipulation under anesthesia to the middle finger.  Following this, I performed a DIP, PIP, and MCP manipulation under anesthesia of the ring finger.  Following this, I performed a DIP, PIP, and MCP manipulation of the small finger.  Once this was done to prevent periarticular arthrofibrosis, I then very carefully examined the thenar space and performed irrigation and debridement, excisional nature skin, subcutaneous tissue, and muscle.  The muscle was pink and there was no necrotic tissue.  I irrigated with a L saline.  Following this, I performed irrigation and debridement of the carpal canal.  Following this, I performed irrigation and debridement, excisional nature skin and   subcutaneous tissue about the hypothenar space which is very soft.  Certainly, the carpal tunnel and the hypothenar space would be able to be close.  Following this, I then performed I and D of the volar forearm.  I performed a palmaris longus tendon tenotomy, I tenotomize the tendon proximally placed into the carpal tunnel incision site and clipped it and removed it.  This allowed for healthy preparation for skin grafting procedure in the future as I do not feel she will be able to be closed primarily in the forearm.  I performed a FCR (flexor carpi radialis tendon) tenosynovectomy.  This was extensive in nature.  I made sure the fasciotomies were looking good in terms of full release and there was full release without tension.  The dorsal forearm was checked multiple times and was soft as it was on the  initial exam and operative intervention.  I felt that this certainly signified the diagnosis, once again of compartment syndrome we asked involvement of the thenar space hand and volar forearm.  I checked the FPL, the FPL was intact.  The muscle had viability.  Although, there were some areas that were questionable on the initial fasciotomy, the muscles were of good healthy viability and there was no dark, dusky, necrotic tissue.  There was a small area about the FPL muscle belly proximally and in the deep portions of the flexor compartment that had a little bit of questionable ischemic change in evolution, but I did not feel the need to perform any muscle debridement in terms of excision of the muscle belly.  At this time, I performed I and D of skin, subcutaneous tissue, and muscle, and irrigated with 6 L of saline in the volar forearm.  Tenolysis was accomplished without difficulty and I was quite pleased. Once this was done, I then very carefully closed the carpal tunnel site and the hypothenar site with interrupted Prolene.  I left the thenar and the volar forearm open, I  checked the dorsal forearm once again and made sure it was soft and it was.  Following this, we then very carefully prepared the skin as the blistering was markedly improved and I placed ioban against the skin away from the blisters, treated the blistered area and then applied a vacuum assisted closure sponge device with bridge to the planar region.  I checked this to make sure, there is no leak and there was not.  I then performed dressing the remainder of the arm with Adaptic Xeroform type material followed by gauze Kerlix Webril and a long-arm splint.  The vacuum assisted closure device was hooked up to 125 mmHg continuous suction and looked well, was no leak.  The patient was transferred to recovery room.  I am very concerned about this patient.  I gave her guarded prognosis given her issues are not only challenging in terms of the physical nature of her problem but also the other aspects that surround her.  I will likely return her for skin graft in the future and I have discussed this with her.  We spent a great deal of time with her now only with the surgical management but with counseling her.  She lives alone.  She has somewhat of a limited ability to absorb this information.  I have tried to regain voice issues or concern and I have also gone backwards in time to try and understand the time frame of her presentation.  With all issues in mind, we will do everything in our power to try to continue to help this patient, and she understands this.     Satira Anis. Amedeo Plenty, M.D.     [facility]  D:  08/05/2013  T:  08/05/2013  Job:  [id number]

## 2013-08-07 ENCOUNTER — Encounter (HOSPITAL_COMMUNITY): Payer: Self-pay | Admitting: Certified Registered Nurse Anesthetist

## 2013-08-07 ENCOUNTER — Encounter (HOSPITAL_COMMUNITY)
Admission: EM | Disposition: A | Discharge status: Home / Self Care | Payer: Self-pay | Source: Home / Self Care | Attending: Orthopedic Surgery

## 2013-08-07 ENCOUNTER — Inpatient Hospital Stay (HOSPITAL_COMMUNITY): Payer: Medicaid Other | Admitting: Anesthesiology

## 2013-08-07 ENCOUNTER — Encounter (HOSPITAL_COMMUNITY): Payer: Medicaid Other | Admitting: Anesthesiology

## 2013-08-07 HISTORY — PX: SKIN FULL THICKNESS GRAFT: SHX442

## 2013-08-07 HISTORY — PX: I & D EXTREMITY: SHX5045

## 2013-08-07 LAB — CBC WITH DIFFERENTIAL/PLATELET
BASOS PCT: 0 % (ref 0–1)
Basophils Absolute: 0 10*3/uL (ref 0.0–0.1)
EOS ABS: 0.4 10*3/uL (ref 0.0–0.7)
EOS PCT: 6 % — AB (ref 0–5)
HEMATOCRIT: 37.8 % (ref 36.0–46.0)
Hemoglobin: 12.6 g/dL (ref 12.0–15.0)
Lymphocytes Relative: 38 % (ref 12–46)
Lymphs Abs: 2.6 10*3/uL (ref 0.7–4.0)
MCH: 30.1 pg (ref 26.0–34.0)
MCHC: 33.3 g/dL (ref 30.0–36.0)
MCV: 90.4 fL (ref 78.0–100.0)
MONO ABS: 0.7 10*3/uL (ref 0.1–1.0)
Monocytes Relative: 9 % (ref 3–12)
NEUTROS ABS: 3.3 10*3/uL (ref 1.7–7.7)
Neutrophils Relative %: 47 % (ref 43–77)
Platelets: 283 10*3/uL (ref 150–400)
RBC: 4.18 MIL/uL (ref 3.87–5.11)
RDW: 14.7 % (ref 11.5–15.5)
WBC: 7 10*3/uL (ref 4.0–10.5)

## 2013-08-07 LAB — SURGICAL PCR SCREEN
MRSA, PCR: NEGATIVE
Staphylococcus aureus: NEGATIVE

## 2013-08-07 LAB — BASIC METABOLIC PANEL
BUN: 8 mg/dL (ref 6–23)
CALCIUM: 9 mg/dL (ref 8.4–10.5)
CHLORIDE: 100 meq/L (ref 96–112)
CO2: 28 mEq/L (ref 19–32)
CREATININE: 0.79 mg/dL (ref 0.50–1.10)
GFR calc non Af Amer: >90 mL/min (ref >90)
Glucose, Bld: 99 mg/dL (ref 70–99)
Potassium: 4.3 mEq/L (ref 3.7–5.3)
Sodium: 140 mEq/L (ref 137–147)

## 2013-08-07 SURGERY — IRRIGATION AND DEBRIDEMENT EXTREMITY
Anesthesia: General | Site: Thigh | Laterality: Right

## 2013-08-07 MED ORDER — FENTANYL CITRATE 0.05 MG/ML IJ SOLN
INTRAMUSCULAR | Status: AC
  Filled 2013-08-07: qty 5

## 2013-08-07 MED ORDER — PHENYLEPHRINE HCL 10 MG/ML IJ SOLN
10.0000 mg | INTRAMUSCULAR | Status: DC | PRN
  Administered 2013-08-07: 10 ug/min via INTRAVENOUS

## 2013-08-07 MED ORDER — SODIUM CHLORIDE 0.9 % IJ SOLN
PREFILLED_SYRINGE | INTRAMUSCULAR | Status: DC | PRN
  Administered 2013-08-07: 19:00:00

## 2013-08-07 MED ORDER — PHENYLEPHRINE HCL 10 MG/ML IJ SOLN
INTRAMUSCULAR | Status: AC
  Filled 2013-08-07: qty 1

## 2013-08-07 MED ORDER — MINERAL OIL LIGHT 100 % EX OIL
TOPICAL_OIL | CUTANEOUS | Status: DC | PRN
  Administered 2013-08-07: 1 via TOPICAL

## 2013-08-07 MED ORDER — PROPOFOL 10 MG/ML IV BOLUS
INTRAVENOUS | Status: AC
  Filled 2013-08-07: qty 20

## 2013-08-07 MED ORDER — BACITRACIN-NEOMYCIN-POLYMYXIN OINTMENT TUBE
TOPICAL_OINTMENT | CUTANEOUS | Status: DC | PRN
  Administered 2013-08-07: 1 via TOPICAL

## 2013-08-07 MED ORDER — MIDAZOLAM HCL 2 MG/2ML IJ SOLN
INTRAMUSCULAR | Status: AC
  Filled 2013-08-07: qty 2

## 2013-08-07 MED ORDER — GLYCOPYRROLATE 0.2 MG/ML IJ SOLN
INTRAMUSCULAR | Status: DC | PRN
  Administered 2013-08-07 (×2): 0.2 mg via INTRAVENOUS

## 2013-08-07 MED ORDER — MIDAZOLAM HCL 5 MG/5ML IJ SOLN
INTRAMUSCULAR | Status: DC | PRN
  Administered 2013-08-07 (×2): 2 mg via INTRAVENOUS

## 2013-08-07 MED ORDER — DOUBLE ANTIBIOTIC 500-10000 UNIT/GM EX OINT
TOPICAL_OINTMENT | CUTANEOUS | Status: AC
  Filled 2013-08-07: qty 1

## 2013-08-07 MED ORDER — FENTANYL CITRATE 0.05 MG/ML IJ SOLN
INTRAMUSCULAR | Status: DC | PRN
  Administered 2013-08-07 (×3): 50 ug via INTRAVENOUS
  Administered 2013-08-07: 150 ug via INTRAVENOUS
  Administered 2013-08-07 (×4): 50 ug via INTRAVENOUS

## 2013-08-07 MED ORDER — PROPOFOL 10 MG/ML IV BOLUS
INTRAVENOUS | Status: DC | PRN
  Administered 2013-08-07: 100 mg via INTRAVENOUS
  Administered 2013-08-07: 50 mg via INTRAVENOUS
  Administered 2013-08-07: 300 mg via INTRAVENOUS

## 2013-08-07 MED ORDER — EPINEPHRINE HCL 0.1 MG/ML IJ SOSY
PREFILLED_SYRINGE | INTRAMUSCULAR | Status: AC
Start: 2013-08-07 — End: 2013-08-07
  Filled 2013-08-07: qty 10

## 2013-08-07 MED ORDER — MINERAL OIL LIGHT 100 % EX OIL
TOPICAL_OIL | CUTANEOUS | Status: AC
Start: 2013-08-07 — End: 2013-08-07
  Filled 2013-08-07: qty 25

## 2013-08-07 MED ORDER — LIDOCAINE HCL 1 % IJ SOLN
INTRAMUSCULAR | Status: DC | PRN
  Administered 2013-08-07: 100 mg via INTRADERMAL

## 2013-08-07 MED ORDER — EPINEPHRINE HCL 1 MG/ML IJ SOLN
INTRAMUSCULAR | Status: AC
  Filled 2013-08-07: qty 1

## 2013-08-07 MED ORDER — ONDANSETRON HCL 4 MG/2ML IJ SOLN
INTRAMUSCULAR | Status: DC | PRN
  Administered 2013-08-07: 4 mg via INTRAVENOUS

## 2013-08-07 MED ORDER — EPINEPHRINE HCL 0.1 MG/ML IJ SOSY
PREFILLED_SYRINGE | INTRAMUSCULAR | Status: DC | PRN
  Administered 2013-08-07: 19:00:00

## 2013-08-07 MED ORDER — BACITRACIN-NEOMYCIN-POLYMYXIN 400-5-5000 EX OINT
TOPICAL_OINTMENT | CUTANEOUS | Status: AC
  Filled 2013-08-07: qty 1

## 2013-08-07 MED ORDER — EPHEDRINE SULFATE 50 MG/ML IJ SOLN
INTRAMUSCULAR | Status: DC | PRN
  Administered 2013-08-07 (×3): 10 mg via INTRAVENOUS

## 2013-08-07 MED ORDER — LIDOCAINE HCL (CARDIAC) 20 MG/ML IV SOLN
INTRAVENOUS | Status: AC
Start: 2013-08-07 — End: 2013-08-07
  Filled 2013-08-07: qty 5

## 2013-08-07 MED ORDER — LACTATED RINGERS IV SOLN
INTRAVENOUS | Status: DC | PRN
  Administered 2013-08-07: 18:00:00 via INTRAVENOUS

## 2013-08-07 MED ORDER — PHENYLEPHRINE HCL 10 MG/ML IJ SOLN
INTRAMUSCULAR | Status: DC | PRN
  Administered 2013-08-07 (×5): 80 ug via INTRAVENOUS

## 2013-08-07 MED ORDER — SODIUM CHLORIDE 0.9 % IR SOLN
Status: DC | PRN
  Administered 2013-08-07: 1

## 2013-08-07 MED ORDER — EPINEPHRINE HCL 1 MG/ML IJ SOLN
INTRAMUSCULAR | Status: DC | PRN
  Administered 2013-08-07: 1 mg

## 2013-08-07 SURGICAL SUPPLY — 63 items
BALL CTTN LRG ABS STRL LF (GAUZE/BANDAGES/DRESSINGS) ×10
BANDAGE CONFORM 2  STR LF (GAUZE/BANDAGES/DRESSINGS) IMPLANT
BANDAGE ELASTIC 4 VELCRO ST LF (GAUZE/BANDAGES/DRESSINGS) ×4 IMPLANT
BANDAGE ELASTIC 6 VELCRO ST LF (GAUZE/BANDAGES/DRESSINGS) ×2 IMPLANT
BANDAGE GAUZE ELAST BULKY 4 IN (GAUZE/BANDAGES/DRESSINGS) ×12 IMPLANT
BLADE DERMATOME II (BLADE) ×2 IMPLANT
BNDG CONFORM 2 STRL LF (GAUZE/BANDAGES/DRESSINGS) ×2 IMPLANT
BNDG CONFORM 3 STRL LF (GAUZE/BANDAGES/DRESSINGS) ×2 IMPLANT
BNDG GAUZE ELAST 4 BULKY (GAUZE/BANDAGES/DRESSINGS) ×6 IMPLANT
CORDS BIPOLAR (ELECTRODE) ×4 IMPLANT
COTTONBALL LRG STERILE PKG (GAUZE/BANDAGES/DRESSINGS) ×10 IMPLANT
CUFF TOURNIQUET SINGLE 18IN (TOURNIQUET CUFF) ×4 IMPLANT
CUFF TOURNIQUET SINGLE 24IN (TOURNIQUET CUFF) IMPLANT
DRAPE ORTHO SPLIT 77X108 STRL (DRAPES) ×8
DRAPE SURG ORHT 6 SPLT 77X108 (DRAPES) IMPLANT
DRSG ADAPTIC 3X8 NADH LF (GAUZE/BANDAGES/DRESSINGS) ×6 IMPLANT
DRSG MEPITEL 4X7.2 (GAUZE/BANDAGES/DRESSINGS) ×4 IMPLANT
ELECT REM PT RETURN 9FT ADLT (ELECTROSURGICAL)
ELECTRODE REM PT RTRN 9FT ADLT (ELECTROSURGICAL) IMPLANT
GAUZE XEROFORM 1X8 LF (GAUZE/BANDAGES/DRESSINGS) ×4 IMPLANT
GAUZE XEROFORM 5X9 LF (GAUZE/BANDAGES/DRESSINGS) ×4 IMPLANT
GLOVE BIOGEL M STRL SZ7.5 (GLOVE) ×4 IMPLANT
GLOVE SS BIOGEL STRL SZ 8 (GLOVE) ×2 IMPLANT
GLOVE SUPERSENSE BIOGEL SZ 8 (GLOVE) ×2
GLOVE SURG SS PI 7.0 STRL IVOR (GLOVE) ×2 IMPLANT
GOWN STRL REUS W/ TWL LRG LVL3 (GOWN DISPOSABLE) ×6 IMPLANT
GOWN STRL REUS W/ TWL XL LVL3 (GOWN DISPOSABLE) ×6 IMPLANT
GOWN STRL REUS W/TWL LRG LVL3 (GOWN DISPOSABLE) ×12
GOWN STRL REUS W/TWL XL LVL3 (GOWN DISPOSABLE) ×12
HANDPIECE INTERPULSE COAX TIP (DISPOSABLE)
KIT BASIN OR (CUSTOM PROCEDURE TRAY) ×4 IMPLANT
KIT ROOM TURNOVER OR (KITS) ×4 IMPLANT
MANIFOLD NEPTUNE II (INSTRUMENTS) ×4 IMPLANT
NDL HYPO 25GX1X1/2 BEV (NEEDLE) IMPLANT
NEEDLE HYPO 25GX1X1/2 BEV (NEEDLE) IMPLANT
NS IRRIG 1000ML POUR BTL (IV SOLUTION) ×4 IMPLANT
PACK ORTHO EXTREMITY (CUSTOM PROCEDURE TRAY) ×4 IMPLANT
PAD ABD 8X10 STRL (GAUZE/BANDAGES/DRESSINGS) ×4 IMPLANT
PAD ARMBOARD 7.5X6 YLW CONV (MISCELLANEOUS) ×8 IMPLANT
PAD CAST 3X4 CTTN HI CHSV (CAST SUPPLIES) IMPLANT
PAD CAST 4YDX4 CTTN HI CHSV (CAST SUPPLIES) ×2 IMPLANT
PADDING CAST COTTON 3X4 STRL (CAST SUPPLIES) ×12
PADDING CAST COTTON 4X4 STRL (CAST SUPPLIES) ×20
SET CYSTO W/LG BORE CLAMP LF (SET/KITS/TRAYS/PACK) ×2 IMPLANT
SET HNDPC FAN SPRY TIP SCT (DISPOSABLE) IMPLANT
SLING ARM IMMOBILIZER LRG (SOFTGOODS) ×2 IMPLANT
SPLINT FIBERGLASS 3X35 (CAST SUPPLIES) ×4 IMPLANT
SPONGE GAUZE 4X4 12PLY (GAUZE/BANDAGES/DRESSINGS) ×6 IMPLANT
SPONGE GAUZE 4X4 12PLY STER LF (GAUZE/BANDAGES/DRESSINGS) ×4 IMPLANT
SPONGE LAP 18X18 X RAY DECT (DISPOSABLE) ×4 IMPLANT
SPONGE LAP 4X18 X RAY DECT (DISPOSABLE) ×4 IMPLANT
SUT CHROMIC 4 0 SH 27 (SUTURE) ×8 IMPLANT
SUT CHROMIC 5 0 P 3 (SUTURE) ×4 IMPLANT
SUT PROLENE 4 0 PS 2 18 (SUTURE) ×12 IMPLANT
SUT VIC AB 3-0 X1 27 (SUTURE) ×2 IMPLANT
SYR CONTROL 10ML LL (SYRINGE) IMPLANT
TOWEL OR 17X24 6PK STRL BLUE (TOWEL DISPOSABLE) ×4 IMPLANT
TOWEL OR 17X26 10 PK STRL BLUE (TOWEL DISPOSABLE) ×4 IMPLANT
TUBE ANAEROBIC SPECIMEN COL (MISCELLANEOUS) IMPLANT
TUBE CONNECTING 12'X1/4 (SUCTIONS) ×1
TUBE CONNECTING 12X1/4 (SUCTIONS) ×3 IMPLANT
WATER STERILE IRR 1000ML POUR (IV SOLUTION) ×4 IMPLANT
YANKAUER SUCT BULB TIP NO VENT (SUCTIONS) ×4 IMPLANT

## 2013-08-07 NOTE — Transfer of Care (Signed)
Immediate Anesthesia Transfer of Care Note  Patient: [name]  Procedure(s) Performed: Procedure(s) with comments: IRRIGATION AND DEBRIDEMENT RIGHT ARM, FOREARM, HAND AND SKIN GRAFTING AS NECESSARY TO AFECTED AREAS (FOREARM AND HAND ) (Right) - graft from right thigh to arm AND SKIN GRAFTING AS NECESSARY TO AFECTED AREAS (FOREARM AND HAND ) (Right) - skin graft from right thigh  Patient Location: PACU  Anesthesia Type:General  Level of Consciousness: awake, alert  and oriented  Airway & Oxygen Therapy: Patient Spontanous Breathing and Patient connected to nasal cannula oxygen  Post-op Assessment: Report given to PACU RN and Post -op Vital signs reviewed and stable  Post vital signs: Reviewed and stable  Complications: No apparent anesthesia complications

## 2013-08-07 NOTE — Anesthesia Procedure Notes (Signed)
Procedure Name: LMA Insertion Date/Time: 08/07/2013 6:03 PM Performed by: Ned Grace Pre-anesthesia Checklist: Patient identified, Patient being monitored, Emergency Drugs available, Timeout performed and Suction available Patient Re-evaluated:Patient Re-evaluated prior to inductionOxygen Delivery Method: Circle system utilized Preoxygenation: Pre-oxygenation with 100% oxygen Intubation Type: IV induction LMA: LMA inserted and LMA with gastric port inserted LMA Size: 4.0 Number of attempts: 2 (LMA#4 initially inserted with poor seal, LMA #4 with gastric port  exchanged with improved ventilation/seal) Placement Confirmation: positive ETCO2 and breath sounds checked- equal and bilateral Dental Injury: Teeth and Oropharynx as per pre-operative assessment

## 2013-08-07 NOTE — Anesthesia Preprocedure Evaluation (Addendum)
Anesthesia Evaluation  Patient identified by MRN, date of birth, ID band Patient awake    Reviewed: Allergy & Precautions, H&P , NPO status , Patient's Chart, lab work & pertinent test results  Airway Mallampati: II TM Distance: >3 FB Neck ROM: Full    Dental  (+) Teeth Intact, Dental Advisory Given   Pulmonary neg shortness of breath, neg sleep apnea, neg COPDneg recent URI, Current Smoker,  breath sounds clear to auscultation        Cardiovascular hypertension, Pt. 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   Dental advisory given  Plan Discussed with: CRNA, Anesthesiologist and Surgeon  Anesthesia Plan Comments:         Anesthesia Quick Evaluation

## 2013-08-07 NOTE — Progress Notes (Signed)
[facility]                                                                              Patient Demographics  [name], is a 53 y.o. female, DOB - 05-02-1960, NFA:[id number]  Admit date - 08/03/2013   Admitting Physician Roseanne Kaufman, MD  Outpatient Primary MD for the patient is Lanette Hampshire, MD  LOS - 4   Chief Complaint  Patient presents with  . Cellulitis      Interim History 53 year old female who presented to the [facility] ED with complaints of increased redness and swelling and pain of her right hand x 3 days. She reports falling onto her Right hand 3 days ago. She reports that the redness was spreading up her forearm. She was evaluated and transferred to [facility] for surgical evaluation by Dr. Amedeo Plenty and underwent an I+D for compartment Syndrome of the Right hand and Forearm. She was placed on IV antibiotic Therapy of Vancomycin and zosyn. [facility] were consulted for medical management of her medical conditions.  Has gone to the OR twice, third time today and will get skin graft.   Difficult to manage pain, she continues to want pain medications- per ortho recommendations.   Assessment & Plan  Compartment syndrome  -Orthopedic surgery, Dr. Amedeo Plenty, following  -Underwent Irrigation and debridement of the right hand and forearm and Fasciotomy on 4/11 -Underwent repeat I&D 4/13 by Dr. Amedeo Plenty -Continue pain control per ortho. -Will likely need skin graft, and will go to the OR again tonight.  Cellulitis of the right hand and forearm  -Continue vancomycin and zosyn  Chronic pain syndrome  -Multifactorial: Osteoarthropathy versus cervical disc disease and lumbago  -Continue Cymbalta and hopefully pain regimen   Benzodiazepine addiction  -Continue Xanax twice a day PRN to prevent withdrawal   Hypertension  -Continue triamterene and HCTZ   Peptic ulcer disease  -Continue PPI   Bipolar disorder  -Currently on no medications    Depression and anxiety  -Continue duloxetine   Tobacco abuse  -Continue nicotine patch   Hypokalemia  -Resolved, Likely secondary to diuretics  -Replaced, will continue to monitor BMP   Code Status: Full   Family Communication: None at bedside   Disposition Plan: Admitted   Time Spent in minutes 25 minutes   Procedures  Irrigation and debridement of the right hand and forearm (x2) Fasciotomy   Consults  TRH   DVT Prophylaxis SCDs   Lab Results  Component Value Date   PLT 283 08/07/2013    Medications  Scheduled Meds: . chlorhexidine  60 mL Topical Once  . DULoxetine  60 mg Oral QHS  . nicotine  7 mg Transdermal Daily  . pantoprazole  40 mg Oral Daily  . piperacillin-tazobactam (ZOSYN)  IV  3.375 g Intravenous Q8H  . triamterene-hydrochlorothiazide  1 tablet Oral q morning - 10a  . vancomycin  1,000 mg Intravenous Q12H  . vancomycin  1,000 mg Intravenous On Call to OR  . vitamin C  1,000 mg Oral Daily   Continuous Infusions: . lactated ringers     PRN Meds:.ALPRAZolam, cyclobenzaprine, HYDROmorphone (DILAUDID) injection, HYDROmorphone, methocarbamol (ROBAXIN) IV, nitroGLYCERIN, ondansetron (ZOFRAN)  IV, ondansetron, oxyCODONE, promethazine  Antibiotics    Anti-infectives   Start     Dose/Rate Route Frequency Ordered Stop   08/07/13 0600  vancomycin (VANCOCIN) IVPB 1000 mg/200 mL premix    Comments:  Dose per pharmacy patient inpatient   1,000 mg 200 mL/hr over 60 Minutes Intravenous On call to O.R. 08/06/13 1715 08/08/13 0559   08/05/13 0600  vancomycin (VANCOCIN) IVPB 1000 mg/200 mL premix     1,000 mg 200 mL/hr over 60 Minutes Intravenous On call to O.R. 08/04/13 2009 08/05/13 1100   08/03/13 2300  vancomycin (VANCOCIN) IVPB 1000 mg/200 mL premix     1,000 mg 200 mL/hr over 60 Minutes Intravenous Every 12 hours 08/03/13 2220     08/03/13 2300  piperacillin-tazobactam (ZOSYN) IVPB 3.375 g     3.375 g 12.5 mL/hr over 240 Minutes Intravenous Every 8  hours 08/03/13 2221     08/03/13 2200  vancomycin (VANCOCIN) IVPB 1000 mg/200 mL premix  Status:  Discontinued     1,000 mg 200 mL/hr over 60 Minutes Intravenous  Once 08/03/13 2158 08/03/13 2221   08/03/13 2200  piperacillin-tazobactam (ZOSYN) IVPB 3.375 g  Status:  Discontinued     3.375 g 100 mL/hr over 30 Minutes Intravenous  Once 08/03/13 2158 08/03/13 2221      Subjective:   Humberto Seals seen and examined today. Patient does not feel her pain is being taken care of.  She has no particular complaints, denies chest pain, shortness of breath, abdominal pain, dizziness   Objective:   Filed Vitals:   08/06/13 1415 08/06/13 2116 08/06/13 2146 08/07/13 0451  BP: 110/47 96/44 109/49 102/49  Pulse: 66 58  58  Temp: 98 F (36.7 C) 98.1 F (36.7 C)  98 F (36.7 C)  TempSrc:  Oral  Oral  Resp: 16 18  18   Height:      Weight:      SpO2: 94% 95%  91%    Wt Readings from Last 3 Encounters:  08/03/13 81.647 kg (180 lb)  08/03/13 81.647 kg (180 lb)  08/03/13 81.647 kg (180 lb)     Intake/Output Summary (Last 24 hours) at 08/07/13 0720 Last data filed at 08/07/13 0644  Gross per 24 hour  Intake    960 ml  Output      0 ml  Net    960 ml    Exam  General: Well developed, well nourished, NAD, appears stated age  HEENT: NCAT, PERRLA, EOMI, Anicteic Sclera, mucous membranes moist.   Neck: Supple, no JVD, no masses  Cardiovascular: S1 S2 auscultated, no rubs, murmurs or gallops. Regular rate and rhythm.  Respiratory: Clear to auscultation bilaterally with equal chest rise  Abdomen: Soft, nontender, nondistended, + bowel sounds  Extremities: warm dry without cyanosis clubbing or edema, left forearm wrapped in ACE bandage, wound vac in place  Neuro: AAOx3, cranial nerves grossly intact. Decreased sensation in right fingers.    Skin: Without rashes exudates or nodules  Psych: Normal affect and demeanor with intact judgement and insight    Data Review   Micro  Results Recent Results (from the past 240 hour(s))  CULTURE, BLOOD (ROUTINE X 2)     Status: None   Collection Time    08/03/13  9:30 PM      Result Value Ref Range Status   Specimen Description RIGHT ANTECUBITAL   Final   Special Requests BOTTLES DRAWN AEROBIC AND ANAEROBIC 8CC   Final   Culture NO GROWTH  3 DAYS   Final   Report Status PENDING   Incomplete  CULTURE, BLOOD (ROUTINE X 2)     Status: None   Collection Time    08/03/13  9:35 PM      Result Value Ref Range Status   Specimen Description BLOOD LEFT WRIST   Final   Special Requests BOTTLES DRAWN AEROBIC ONLY 6CC   Final   Culture NO GROWTH 3 DAYS   Final   Report Status PENDING   Incomplete  ANAEROBIC CULTURE     Status: None   Collection Time    08/04/13  2:02 AM      Result Value Ref Range Status   Specimen Description WOUND RIGHT ARM   Final   Special Requests PATIENT ON FOLLOWING VANCOMYCIN AND ZOCYN   Final   Gram Stain     Final   Value: NO WBC SEEN     RARE SQUAMOUS EPITHELIAL CELLS PRESENT     NO ORGANISMS SEEN     Performed at [facility]   Culture     Final   Value: NO ANAEROBES ISOLATED; CULTURE IN PROGRESS FOR 5 DAYS     Performed at [facility]   Report Status PENDING   Incomplete  WOUND CULTURE     Status: None   Collection Time    08/04/13  2:02 AM      Result Value Ref Range Status   Specimen Description WOUND RIGHT ARM   Final   Special Requests PATIENT ON FOLLOWING VANCOMYCIN AND ZOCYN   Final   Gram Stain     Final   Value: NO WBC SEEN     NO SQUAMOUS EPITHELIAL CELLS SEEN     NO ORGANISMS SEEN     Performed at [facility]   Culture     Final   Value: NO GROWTH 2 DAYS     Performed at [facility]   Report Status 08/06/2013 FINAL   Final    Radiology Reports Dg Elbow Complete Right  07/11/2013   CLINICAL DATA:  [place].  Elbow pain.  EXAM: RIGHT ELBOW - COMPLETE 3+ VIEW  FINDINGS: The joint spaces are maintained. No acute fracture or joint effusion. No  osteochondral abnormality.  IMPRESSION: No acute bony findings or joint effusion.   Electronically Signed   By: Kalman Jewels M.D.   On: 07/11/2013 17:25   Dg Forearm Right  08/03/2013   CLINICAL DATA:  CELLULITIS  EXAM: RIGHT WRIST - COMPLETE 3+ VIEW; RIGHT HAND - COMPLETE 3+ VIEW; RIGHT FOREARM - 2 VIEW  COMPARISON:  DG ELBOW COMPLETE*R* dated 07/11/2013  FINDINGS: There is no evidence of fracture or dislocation. There is no evidence of arthropathy or other focal bone abnormality. Wrist and hand soft tissue swelling. Anterior forearm skin irregularity without subcutaneous gas or radiopaque foreign bodies.  IMPRESSION: No fracture deformity,, dislocation or destructive bony lesions.  Soft tissue swelling of the anterior forearm skin irregularity, no subcutaneous gas or radiopaque foreign bodies.   Electronically Signed   By: Elon Alas   On: 08/03/2013 20:26   Dg Wrist Complete Right  08/03/2013   CLINICAL DATA:  CELLULITIS  EXAM: RIGHT WRIST - COMPLETE 3+ VIEW; RIGHT HAND - COMPLETE 3+ VIEW; RIGHT FOREARM - 2 VIEW  COMPARISON:  DG ELBOW COMPLETE*R* dated 07/11/2013  FINDINGS: There is no evidence of fracture or dislocation. There is no evidence of arthropathy or other focal bone abnormality. Wrist and hand soft tissue swelling.  Anterior forearm skin irregularity without subcutaneous gas or radiopaque foreign bodies.  IMPRESSION: No fracture deformity,, dislocation or destructive bony lesions.  Soft tissue swelling of the anterior forearm skin irregularity, no subcutaneous gas or radiopaque foreign bodies.   Electronically Signed   By: Elon Alas   On: 08/03/2013 20:26   Ct Head Wo Contrast  07/11/2013   CLINICAL DATA:  Neck pain.  Trauma.  Headache.  EXAM: CT HEAD WITHOUT CONTRAST  CT CERVICAL SPINE WITHOUT CONTRAST  TECHNIQUE: Multidetector CT imaging of the head and cervical spine was performed following the standard protocol without intravenous contrast. Multiplanar CT image  reconstructions of the cervical spine were also generated.  COMPARISON:  06/01/2005  FINDINGS: CT HEAD FINDINGS  The brain has normal appearance without evidence of old or acute infarction, mass lesion, hemorrhage, hydrocephalus or extra-axial collection. No skull fracture. No fluid in the sinuses.  CT CERVICAL SPINE FINDINGS  Alignment is normal. No fracture or soft tissue swelling. 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 No stenosis of the canal or foramina identified. Lung apices show abnormal density possibly related to respiratory bronchiolitis. Not completely evaluated.  IMPRESSION: Head CT:  Normal.  Cervical spine CT: Minimal degenerative changes. No acute or traumatic finding.   Electronically Signed   By: Nelson Chimes M.D.   On: 07/11/2013 16:51   Ct Cervical Spine Wo Contrast  07/11/2013   CLINICAL DATA:  Neck pain.  Trauma.  Headache.  EXAM: CT HEAD WITHOUT CONTRAST  CT CERVICAL SPINE WITHOUT CONTRAST  TECHNIQUE: Multidetector CT imaging of the head and cervical spine was performed following the standard protocol without intravenous contrast. Multiplanar CT image reconstructions of the cervical spine were also generated.  COMPARISON:  06/01/2005  FINDINGS: CT HEAD FINDINGS  The brain has normal appearance without evidence of old or acute infarction, mass lesion, hemorrhage, hydrocephalus or extra-axial collection. No skull fracture. No fluid in the sinuses.  CT CERVICAL SPINE FINDINGS  Alignment is normal. No fracture or soft tissue swelling. 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 No stenosis of the canal or foramina identified. Lung apices show abnormal density possibly related to respiratory bronchiolitis. Not completely evaluated.  IMPRESSION: Head CT:  Normal.  Cervical spine CT: Minimal degenerative changes. No acute or traumatic finding.   Electronically Signed   By: Nelson Chimes M.D.   On: 07/11/2013 16:51   Dg Hand Complete Right  08/03/2013   CLINICAL  DATA:  CELLULITIS  EXAM: RIGHT WRIST - COMPLETE 3+ VIEW; RIGHT HAND - COMPLETE 3+ VIEW; RIGHT FOREARM - 2 VIEW  COMPARISON:  DG ELBOW COMPLETE*R* dated 07/11/2013  FINDINGS: There is no evidence of fracture or dislocation. There is no evidence of arthropathy or other focal bone abnormality. Wrist and hand soft tissue swelling. Anterior forearm skin irregularity without subcutaneous gas or radiopaque foreign bodies.  IMPRESSION: No fracture deformity,, dislocation or destructive bony lesions.  Soft tissue swelling of the anterior forearm skin irregularity, no subcutaneous gas or radiopaque foreign bodies.   Electronically Signed   By: Elon Alas   On: 08/03/2013 20:26    CBC  Recent Labs Lab 08/03/13 2130 08/05/13 0630 08/06/13 0420 08/07/13 0505  WBC 12.0* 8.2 7.4 7.0  HGB 13.8 13.2 12.4 12.6  HCT 40.4 39.2 37.1 37.8  PLT 256 264 256 283  MCV 88.4 90.1 90.5 90.4  MCH 30.2 30.3 30.2 30.1  MCHC 34.2 33.7 33.4 33.3  RDW 14.8 15.0 14.6 14.7  LYMPHSABS 2.2 2.3 2.1  2.6  MONOABS 1.0 0.7 0.6 0.7  EOSABS 0.3 0.5 0.4 0.4  BASOSABS 0.0 0.0 0.0 0.0    Chemistries   Recent Labs Lab 08/03/13 2130 08/05/13 0630 08/06/13 0420 08/07/13 0505  NA 135* 140 138 140  K 3.1* 3.4* 4.3 4.3  CL 96 100 98 100  CO2 27 28 28 28   GLUCOSE 127* 100* 99 99  BUN 14 8 10 8   CREATININE 0.77 0.71 0.88 0.79  CALCIUM 8.8 8.8 8.5 9.0   ------------------------------------------------------------------------------------------------------------------ estimated creatinine clearance is 83.1 ml/min (by C-G formula based on Cr of 0.79). ------------------------------------------------------------------------------------------------------------------ No results found for this basename: HGBA1C,  in the last 72 hours ------------------------------------------------------------------------------------------------------------------ No results found for this basename: CHOL, HDL, LDLCALC, TRIG, CHOLHDL, LDLDIRECT,   in the last 72 hours ------------------------------------------------------------------------------------------------------------------ No results found for this basename: TSH, T4TOTAL, FREET3, T3FREE, THYROIDAB,  in the last 72 hours ------------------------------------------------------------------------------------------------------------------ No results found for this basename: VITAMINB12, FOLATE, FERRITIN, TIBC, IRON, RETICCTPCT,  in the last 72 hours  Coagulation profile No results found for this basename: INR, PROTIME,  in the last 168 hours  No results found for this basename: DDIMER,  in the last 72 hours  Cardiac Enzymes No results found for this basename: CK, CKMB, TROPONINI, MYOGLOBIN,  in the last 168 hours ------------------------------------------------------------------------------------------------------------------ No components found with this basename: POCBNP,     Lisanne Ponce D.O. on 08/07/2013 at 7:20 AM  Between 7am to 7pm - Pager - [phone]  After 7pm go to [email or web address] - password [email or web address]  And look for the night coverage person covering for me after hours  [facility] Office  [phone]

## 2013-08-07 NOTE — Anesthesia Postprocedure Evaluation (Signed)
  Anesthesia Post-op Note  Patient: [name]  Procedure(s) Performed: Procedure(s) with comments: IRRIGATION AND DEBRIDEMENT RIGHT ARM, FOREARM, HAND AND SKIN GRAFTING AS NECESSARY TO AFECTED AREAS (FOREARM AND HAND ) (Right) - graft from right thigh to arm AND SKIN GRAFTING AS NECESSARY TO AFECTED AREAS (FOREARM AND HAND ) (Right) - skin graft from right thigh  Patient Location: PACU  Anesthesia Type:General  Level of Consciousness: awake, alert , oriented and patient cooperative  Airway and Oxygen Therapy: Patient Spontanous Breathing  Post-op Pain: mild  Post-op Assessment: Post-op Vital signs reviewed, Patient's Cardiovascular Status Stable, Respiratory Function Stable, Patent Airway, No signs of Nausea or vomiting and Pain level controlled  Post-op Vital Signs: Reviewed and stable  Last Vitals:  Filed Vitals:   08/07/13 2028  BP:   Pulse: 83  Temp: 36.6 C  Resp: 14    Complications: No apparent anesthesia complications

## 2013-08-07 NOTE — Op Note (Signed)
See dictation [phone] [place] [facility]

## 2013-08-07 NOTE — Progress Notes (Signed)
Patient ID: [name], female   DOB: [birth year], 53 y.o.   MRN: [id number] And examined.  Patient is ready to proceed with surgical avenues of care.  We'll plan for I&D possible split thickness skin graft today.  All questions have been encouraged and answered to  Her labs looking very well.  Her white blood cell count has normalized  All questions have been encouraged and answered.  We still has some very challenging circumstances in front of [place] I would give her a guarded prognosis.  Minh Roanhorse M.D.

## 2013-08-08 ENCOUNTER — Encounter (HOSPITAL_COMMUNITY): Payer: Self-pay | Admitting: Orthopedic Surgery

## 2013-08-08 LAB — BASIC METABOLIC PANEL
BUN: 9 mg/dL (ref 6–23)
CALCIUM: 9 mg/dL (ref 8.4–10.5)
CO2: 28 mEq/L (ref 19–32)
Chloride: 99 mEq/L (ref 96–112)
Creatinine, Ser: 0.78 mg/dL (ref 0.50–1.10)
Glucose, Bld: 102 mg/dL — ABNORMAL HIGH (ref 70–99)
POTASSIUM: 3.4 meq/L — AB (ref 3.7–5.3)
SODIUM: 140 meq/L (ref 137–147)

## 2013-08-08 LAB — CBC WITH DIFFERENTIAL/PLATELET
Basophils Absolute: 0 10*3/uL (ref 0.0–0.1)
Basophils Relative: 0 % (ref 0–1)
EOS ABS: 0.4 10*3/uL (ref 0.0–0.7)
EOS PCT: 4 % (ref 0–5)
HCT: 37.5 % (ref 36.0–46.0)
Hemoglobin: 12.4 g/dL (ref 12.0–15.0)
Lymphocytes Relative: 29 % (ref 12–46)
Lymphs Abs: 2.6 10*3/uL (ref 0.7–4.0)
MCH: 30 pg (ref 26.0–34.0)
MCHC: 33.1 g/dL (ref 30.0–36.0)
MCV: 90.6 fL (ref 78.0–100.0)
Monocytes Absolute: 0.7 10*3/uL (ref 0.1–1.0)
Monocytes Relative: 7 % (ref 3–12)
NEUTROS PCT: 60 % (ref 43–77)
Neutro Abs: 5.3 10*3/uL (ref 1.7–7.7)
PLATELETS: 317 10*3/uL (ref 150–400)
RBC: 4.14 MIL/uL (ref 3.87–5.11)
RDW: 14.9 % (ref 11.5–15.5)
WBC: 9 10*3/uL (ref 4.0–10.5)

## 2013-08-08 LAB — CULTURE, BLOOD (ROUTINE X 2)
Culture: NO GROWTH
Culture: NO GROWTH

## 2013-08-08 MED ORDER — MENTHOL 3 MG MT LOZG: 1.0000 | LOZENGE | OROMUCOSAL | Status: DC | PRN

## 2013-08-08 MED ORDER — POTASSIUM CHLORIDE CRYS ER 20 MEQ PO TBCR
40.0000 meq | EXTENDED_RELEASE_TABLET | Freq: Once | ORAL | Status: AC
  Administered 2013-08-08: 40 meq via ORAL
  Filled 2013-08-08: qty 2

## 2013-08-08 MED ORDER — OXYCODONE HCL 5 MG PO TABS
10.0000 mg | ORAL_TABLET | ORAL | Status: DC | PRN
  Administered 2013-08-08 – 2013-08-10 (×12): 20 mg via ORAL
  Filled 2013-08-08 (×12): qty 4

## 2013-08-08 NOTE — Care Management Note (Signed)
CARE MANAGEMENT NOTE 08/08/2013  Patient:  [name], [name]   Account Number:  [id number]  Date Initiated:  08/08/2013  Documentation initiated by:  Ricki Miller  Subjective/Objective Assessment:   53 yr old female admitted with right hand compartment syndrome, s/p I & D with skin grafting.     Action/Plan:   Patient to be assessed by CIR. Social worker notified for SNF backup.CM will continue to monitor.   Anticipated DC Date:     Anticipated DC Plan:           Choice offered to / List presented to:             Status of service:  In process, will continue to follow

## 2013-08-08 NOTE — Progress Notes (Signed)
Pt arrives onto the unit from PACU at 2046, report received from RN and no complaints of pain while in PACU. Upon arrival pt is c/o pain level of 10 or greater and requests pain meds. Pt given 10 mg flexeril & 10 mg oxy ir at 2107. One hour later pain level is still at a 10 or greater. Pt given 1 mg xanax and 4 mg dilaudid po at 2214. One hour later pain level continues at a 10 or greater, pt then given 1 mg IV dilaudid. Pt states that prior to admission pain regimen is 30 mg oxy ir q4h. Pt states that if MD does not give ordered home dosage of pain medication that she would d/c AMA and her a girlfriend would "pay a visit to his house". MD on call with the hand center notified about pain control, given orders to allow 10-20 mg oxy ir prn. Will continue to monitor.

## 2013-08-08 NOTE — Progress Notes (Addendum)
Dressing changed to right upper thigh d/t patient went to bathroom, unassisted and ace wrap/soft wrap was below knee, site exposed.  New ABD, non-adherent (2) 2x6 and ACE wrap applied.  Xeroform dsg reinforced as ordered by PA Aaron Edelman verbally this AM. Patient tolerated well.  Nsg to continue to monitor for status changes.

## 2013-08-08 NOTE — Progress Notes (Signed)
Chart reviewed.     [facility]                                                                              Patient Demographics  [name], is a 53 y.o. female, DOB - 1960-06-25, YIF:[id number]  Admit date - 08/03/2013   Admitting Physician Roseanne Kaufman, MD  Outpatient Primary MD for the patient is Lanette Hampshire, MD  LOS - 5   Chief Complaint  Patient presents with  . Cellulitis      Interim History 53 year old female who presented to the [facility] ED with complaints of increased redness and swelling and pain of her right hand x 3 days. She reports falling onto her Right hand 3 days ago. She reports that the redness was spreading up her forearm. She was evaluated and transferred to [facility] for surgical evaluation by Dr. Amedeo Plenty and underwent an I+D for compartment Syndrome of the Right hand and Forearm. She was placed on IV antibiotic Therapy of Vancomycin and zosyn. [facility] were consulted for medical management of her medical conditions.  Has gone to the OR twice, third time today and will get skin graft.   Difficult to manage pain, she continues to want pain medications- per ortho recommendations.   Assessment & Plan  Compartment syndrome  -Orthopedic surgery, Dr. Amedeo Plenty, following  -Underwent Irrigation and debridement of the right hand and forearm and Fasciotomy on 4/11 -Underwent repeat I&D 4/13 by Dr. Amedeo Plenty -Continue pain control per ortho. -Will likely need skin graft, and will go to the OR again tonight.  Cellulitis of the right hand and forearm  -Continue vancomycin and zosyn  Chronic pain syndrome  -Multifactorial: Osteoarthropathy versus cervical disc disease and lumbago  -Continue Cymbalta and hopefully pain regimen   Benzodiazepine addiction  -Continue Xanax twice a day PRN to prevent withdrawal   Hypertension  -Continue triamterene and HCTZ   Peptic ulcer disease  -Continue PPI   Bipolar disorder  -Currently on no  medications   Depression and anxiety  -Continue duloxetine   Tobacco abuse  -Continue nicotine patch   Hypokalemia  Has returned. Replete PO  Code Status: Full   Family Communication: None at bedside   Disposition Plan: Admitted   Time Spent in minutes 25 minutes   Procedures  Irrigation and debridement of the right hand and forearm (x2) Fasciotomy   Consults  TRH   DVT Prophylaxis SCDs   Lab Results  Component Value Date   PLT 317 08/08/2013    Medications  Scheduled Meds: . DULoxetine  60 mg Oral QHS  . nicotine  7 mg Transdermal Daily  . pantoprazole  40 mg Oral Daily  . piperacillin-tazobactam (ZOSYN)  IV  3.375 g Intravenous Q8H  . triamterene-hydrochlorothiazide  1 tablet Oral q morning - 10a  . vancomycin  1,000 mg Intravenous Q12H  . vitamin C  1,000 mg Oral Daily   Continuous Infusions: . lactated ringers     PRN Meds:.ALPRAZolam, cyclobenzaprine, HYDROmorphone (DILAUDID) injection, HYDROmorphone, menthol-cetylpyridinium, methocarbamol (ROBAXIN) IV, nitroGLYCERIN, ondansetron (ZOFRAN) IV, ondansetron, oxyCODONE, promethazine  Antibiotics    Anti-infectives   Start     Dose/Rate Route Frequency Ordered Stop  08/07/13 0600  vancomycin (VANCOCIN) IVPB 1000 mg/200 mL premix    Comments:  Dose per pharmacy patient inpatient   1,000 mg 200 mL/hr over 60 Minutes Intravenous On call to O.R. 08/06/13 1715 08/07/13 1830   08/05/13 0600  vancomycin (VANCOCIN) IVPB 1000 mg/200 mL premix     1,000 mg 200 mL/hr over 60 Minutes Intravenous On call to O.R. 08/04/13 2009 08/05/13 1100   08/03/13 2300  vancomycin (VANCOCIN) IVPB 1000 mg/200 mL premix     1,000 mg 200 mL/hr over 60 Minutes Intravenous Every 12 hours 08/03/13 2220     08/03/13 2300  piperacillin-tazobactam (ZOSYN) IVPB 3.375 g     3.375 g 12.5 mL/hr over 240 Minutes Intravenous Every 8 hours 08/03/13 2221     08/03/13 2200  vancomycin (VANCOCIN) IVPB 1000 mg/200 mL premix  Status:  Discontinued      1,000 mg 200 mL/hr over 60 Minutes Intravenous  Once 08/03/13 2158 08/03/13 2221   08/03/13 2200  piperacillin-tazobactam (ZOSYN) IVPB 3.375 g  Status:  Discontinued     3.375 g 100 mL/hr over 30 Minutes Intravenous  Once 08/03/13 2158 08/03/13 2221      Subjective:   Unable. Patient won't hang up the phone.  Objective:   Filed Vitals:   08/07/13 2028 08/07/13 2058 08/08/13 0123 08/08/13 0443  BP:  120/57 117/52 103/53  Pulse: 83 80 74 65  Temp: 97.9 F (36.6 C) 98 F (36.7 C) 98.3 F (36.8 C) 97.7 F (36.5 C)  TempSrc:  Oral Oral Oral  Resp: 14  16 16   Height:      Weight:      SpO2: 92% 94% 97% 95%    Wt Readings from Last 3 Encounters:  08/03/13 81.647 kg (180 lb)  08/03/13 81.647 kg (180 lb)  08/03/13 81.647 kg (180 lb)     Intake/Output Summary (Last 24 hours) at 08/08/13 1247 Last data filed at 08/07/13 2032  Gross per 24 hour  Intake    700 ml  Output      0 ml  Net    700 ml    Exam  General: In chair, talking on phone, unwilling to hang up.  Did not examine    Data Review   Micro Results Recent Results (from the past 240 hour(s))  CULTURE, BLOOD (ROUTINE X 2)     Status: None   Collection Time    08/03/13  9:30 PM      Result Value Ref Range Status   Specimen Description BLOOD RIGHT ANTECUBITAL   Final   Special Requests BOTTLES DRAWN AEROBIC AND ANAEROBIC 8CC   Final   Culture NO GROWTH 5 DAYS   Final   Report Status 08/08/2013 FINAL   Final  CULTURE, BLOOD (ROUTINE X 2)     Status: None   Collection Time    08/03/13  9:35 PM      Result Value Ref Range Status   Specimen Description BLOOD LEFT WRIST   Final   Special Requests BOTTLES DRAWN AEROBIC ONLY [place]   Final   Culture NO GROWTH 5 DAYS   Final   Report Status 08/08/2013 FINAL   Final  ANAEROBIC CULTURE     Status: None   Collection Time    08/04/13  2:02 AM      Result Value Ref Range Status   Specimen Description WOUND RIGHT ARM   Final   Special Requests PATIENT ON  FOLLOWING VANCOMYCIN AND ZOCYN   Final  Gram Stain     Final   Value: NO WBC SEEN     RARE SQUAMOUS EPITHELIAL CELLS PRESENT     NO ORGANISMS SEEN     Performed at [facility]   Culture     Final   Value: NO ANAEROBES ISOLATED; CULTURE IN PROGRESS FOR 5 DAYS     Performed at [facility]   Report Status PENDING   Incomplete  WOUND CULTURE     Status: None   Collection Time    08/04/13  2:02 AM      Result Value Ref Range Status   Specimen Description WOUND RIGHT ARM   Final   Special Requests PATIENT ON FOLLOWING VANCOMYCIN AND ZOCYN   Final   Gram Stain     Final   Value: NO WBC SEEN     NO SQUAMOUS EPITHELIAL CELLS SEEN     NO ORGANISMS SEEN     Performed at [facility]   Culture     Final   Value: NO GROWTH 2 DAYS     Performed at [facility]   Report Status 08/06/2013 FINAL   Final  SURGICAL PCR SCREEN     Status: None   Collection Time    08/07/13  6:25 AM      Result Value Ref Range Status   MRSA, PCR NEGATIVE  NEGATIVE Final   Staphylococcus aureus NEGATIVE  NEGATIVE Final   Comment:            The Xpert SA Assay (FDA     approved for NASAL specimens     in patients over 84 years of age),     is one component of     a comprehensive surveillance     program.  Test performance has     been validated by [employer] for patients greater     than or equal to 24 year old.     It is not intended     to diagnose infection nor to     guide or monitor treatment.    Radiology Reports Dg Elbow Complete Right  07/11/2013   CLINICAL DATA:  Larey Seat.  Elbow pain.  EXAM: RIGHT ELBOW - COMPLETE 3+ VIEW  FINDINGS: The joint spaces are maintained. No acute fracture or joint effusion. No osteochondral abnormality.  IMPRESSION: No acute bony findings or joint effusion.   Electronically Signed   By: Loralie Champagne M.D.   On: 07/11/2013 17:25   Dg Forearm Right  08/03/2013   CLINICAL DATA:  CELLULITIS  EXAM: RIGHT WRIST - COMPLETE 3+ VIEW; RIGHT  HAND - COMPLETE 3+ VIEW; RIGHT FOREARM - 2 VIEW  COMPARISON:  DG ELBOW COMPLETE*R* dated 07/11/2013  FINDINGS: There is no evidence of fracture or dislocation. There is no evidence of arthropathy or other focal bone abnormality. Wrist and hand soft tissue swelling. Anterior forearm skin irregularity without subcutaneous gas or radiopaque foreign bodies.  IMPRESSION: No fracture deformity,, dislocation or destructive bony lesions.  Soft tissue swelling of the anterior forearm skin irregularity, no subcutaneous gas or radiopaque foreign bodies.   Electronically Signed   By: Awilda Metro   On: 08/03/2013 20:26   Dg Wrist Complete Right  08/03/2013   CLINICAL DATA:  CELLULITIS  EXAM: RIGHT WRIST - COMPLETE 3+ VIEW; RIGHT HAND - COMPLETE 3+ VIEW; RIGHT FOREARM - 2 VIEW  COMPARISON:  DG ELBOW COMPLETE*R* dated 07/11/2013  FINDINGS: There is no evidence of  fracture or dislocation. There is no evidence of arthropathy or other focal bone abnormality. Wrist and hand soft tissue swelling. Anterior forearm skin irregularity without subcutaneous gas or radiopaque foreign bodies.  IMPRESSION: No fracture deformity,, dislocation or destructive bony lesions.  Soft tissue swelling of the anterior forearm skin irregularity, no subcutaneous gas or radiopaque foreign bodies.   Electronically Signed   By: Elon Alas   On: 08/03/2013 20:26   Ct Head Wo Contrast  07/11/2013   CLINICAL DATA:  Neck pain.  Trauma.  Headache.  EXAM: CT HEAD WITHOUT CONTRAST  CT CERVICAL SPINE WITHOUT CONTRAST  TECHNIQUE: Multidetector CT imaging of the head and cervical spine was performed following the standard protocol without intravenous contrast. Multiplanar CT image reconstructions of the cervical spine were also generated.  COMPARISON:  06/01/2005  FINDINGS: CT HEAD FINDINGS  The brain has normal appearance without evidence of old or acute infarction, mass lesion, hemorrhage, hydrocephalus or extra-axial collection. No skull fracture. No  fluid in the sinuses.  CT CERVICAL SPINE FINDINGS  Alignment is normal. No fracture or soft tissue swelling. 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 No stenosis of the canal or foramina identified. Lung apices show abnormal density possibly related to respiratory bronchiolitis. Not completely evaluated.  IMPRESSION: Head CT:  Normal.  Cervical spine CT: Minimal degenerative changes. No acute or traumatic finding.   Electronically Signed   By: Nelson Chimes M.D.   On: 07/11/2013 16:51   Ct Cervical Spine Wo Contrast  07/11/2013   CLINICAL DATA:  Neck pain.  Trauma.  Headache.  EXAM: CT HEAD WITHOUT CONTRAST  CT CERVICAL SPINE WITHOUT CONTRAST  TECHNIQUE: Multidetector CT imaging of the head and cervical spine was performed following the standard protocol without intravenous contrast. Multiplanar CT image reconstructions of the cervical spine were also generated.  COMPARISON:  06/01/2005  FINDINGS: CT HEAD FINDINGS  The brain has normal appearance without evidence of old or acute infarction, mass lesion, hemorrhage, hydrocephalus or extra-axial collection. No skull fracture. No fluid in the sinuses.  CT CERVICAL SPINE FINDINGS  Alignment is normal. No fracture or soft tissue swelling. 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 No stenosis of the canal or foramina identified. Lung apices show abnormal density possibly related to respiratory bronchiolitis. Not completely evaluated.  IMPRESSION: Head CT:  Normal.  Cervical spine CT: Minimal degenerative changes. No acute or traumatic finding.   Electronically Signed   By: Nelson Chimes M.D.   On: 07/11/2013 16:51   Dg Hand Complete Right  08/03/2013   CLINICAL DATA:  CELLULITIS  EXAM: RIGHT WRIST - COMPLETE 3+ VIEW; RIGHT HAND - COMPLETE 3+ VIEW; RIGHT FOREARM - 2 VIEW  COMPARISON:  DG ELBOW COMPLETE*R* dated 07/11/2013  FINDINGS: There is no evidence of fracture or dislocation. There is no evidence of arthropathy or other focal bone  abnormality. Wrist and hand soft tissue swelling. Anterior forearm skin irregularity without subcutaneous gas or radiopaque foreign bodies.  IMPRESSION: No fracture deformity,, dislocation or destructive bony lesions.  Soft tissue swelling of the anterior forearm skin irregularity, no subcutaneous gas or radiopaque foreign bodies.   Electronically Signed   By: Elon Alas   On: 08/03/2013 20:26    CBC  Recent Labs Lab 08/03/13 2130 08/05/13 0630 08/06/13 0420 08/07/13 0505 08/08/13 0422  WBC 12.0* 8.2 7.4 7.0 9.0  HGB 13.8 13.2 12.4 12.6 12.4  HCT 40.4 39.2 37.1 37.8 37.5  PLT 256 264 256 283 317  MCV 88.4  90.1 90.5 90.4 90.6  MCH 30.2 30.3 30.2 30.1 30.0  MCHC 34.2 33.7 33.4 33.3 33.1  RDW 14.8 15.0 14.6 14.7 14.9  LYMPHSABS 2.2 2.3 2.1 2.6 2.6  MONOABS 1.0 0.7 0.6 0.7 0.7  EOSABS 0.3 0.5 0.4 0.4 0.4  BASOSABS 0.0 0.0 0.0 0.0 0.0    Chemistries   Recent Labs Lab 08/03/13 2130 08/05/13 0630 08/06/13 0420 08/07/13 0505 08/08/13 0422  NA 135* 140 138 140 140  K 3.1* 3.4* 4.3 4.3 3.4*  CL 96 100 98 100 99  CO2 27 28 28 28 28   GLUCOSE 127* 100* 99 99 102*  BUN 14 8 10 8 9   CREATININE 0.77 0.71 0.88 0.79 0.78  CALCIUM 8.8 8.8 8.5 9.0 9.0   ------------------------------------------------------------------------------------------------------------------ estimated creatinine clearance is 83.1 ml/min (by C-G formula based on Cr of 0.78). ------------------------------------------------------------------------------------------------------------------ No results found for this basename: HGBA1C,  in the last 72 hours ------------------------------------------------------------------------------------------------------------------ No results found for this basename: CHOL, HDL, LDLCALC, TRIG, CHOLHDL, LDLDIRECT,  in the last 72 hours ------------------------------------------------------------------------------------------------------------------ No results found for this  basename: TSH, T4TOTAL, FREET3, T3FREE, THYROIDAB,  in the last 72 hours ------------------------------------------------------------------------------------------------------------------ No results found for this basename: VITAMINB12, FOLATE, FERRITIN, TIBC, IRON, RETICCTPCT,  in the last 72 hours  Coagulation profile No results found for this basename: INR, PROTIME,  in the last 168 hours  No results found for this basename: DDIMER,  in the last 72 hours  Cardiac Enzymes No results found for this basename: CK, CKMB, TROPONINI, MYOGLOBIN,  in the last 168 hours ------------------------------------------------------------------------------------------------------------------ No components found with this basename: POCBNP,     Delfina Redwood D.O. on 08/08/2013 at 12:47 PM  Between 7am to 7pm - Pager - [phone]  After 7pm go to [email or web address] - password [email or web address]  And look for the night coverage person covering for me after hours  [facility] Office  [phone]

## 2013-08-08 NOTE — Progress Notes (Signed)
Subjective: 1 Day Post-Op Procedure(s) (LRB): IRRIGATION AND DEBRIDEMENT RIGHT ARM, FOREARM, HAND AND SKIN GRAFTING AS NECESSARY TO AFECTED AREAS (FOREARM AND HAND ) (Right) AND SKIN GRAFTING AS NECESSARY TO AFECTED AREAS (FOREARM AND HAND ) (Right) Patient reports pain as moderate.   tol po Stable D/w patient all issues Many issues and plans  Objective: Vital signs in last 24 hours: Temp:  [97.6 F (36.4 C)-98.5 F (36.9 C)] 97.7 F (36.5 C) (04/15 0443) Pulse Rate:  [65-103] 65 (04/15 0443) Resp:  [14-17] 16 (04/15 0443) BP: (103-131)/(52-77) 103/53 mmHg (04/15 0443) SpO2:  [91 %-97 %] 95 % (04/15 0443)  Intake/Output from previous day: 04/14 0701 - 04/15 0700 In: 700 [I.V.:700] Out: -  Intake/Output this shift:     Recent Labs  08/06/13 0420 08/07/13 0505 08/08/13 0422  HGB 12.4 12.6 12.4    Recent Labs  08/07/13 0505 08/08/13 0422  WBC 7.0 9.0  RBC 4.18 4.14  HCT 37.8 37.5  PLT 283 317    Recent Labs  08/07/13 0505 08/08/13 0422  NA 140 140  K 4.3 3.4*  CL 100 99  CO2 28 28  BUN 8 9  CREATININE 0.79 0.78  GLUCOSE 99 102*  CALCIUM 9.0 9.0   No results found for this basename: LABPT, INR,  in the last 72 hours Physical exam: stable Lungs CTA Abd NT ND soft Heart RR Left hand NVI looks better Right arm stable better motion,sensation is unchanged,good refill No S/S DVT  Assessment/Plan: 1 Day Post-Op Procedure(s) (LRB): IRRIGATION AND DEBRIDEMENT RIGHT ARM, FOREARM, HAND AND SKIN GRAFTING AS NECESSARY TO AFECTED AREAS (FOREARM AND HAND ) (Right) AND SKIN GRAFTING AS NECESSARY TO AFECTED AREAS (FOREARM AND HAND ) (Right) Advance diet Up with therapy Care mgt consult-will need help given the upper and lower extremity issues Cont ABX and pain mgt D/w nurse  Roseanne Kaufman 08/08/2013, 1:20 PM

## 2013-08-08 NOTE — Progress Notes (Signed)
Patient is refusing to elevate arm(s) d/t being very "sleepy".  Hair dryer, mission sling in room per MD order.  K heating pad has been ordered, not on unit as of now.  Nsg to continue to monitor for status changes.

## 2013-08-08 NOTE — Progress Notes (Signed)
Occupational Therapy Treatment Patient Details Name: MEERA VASCO MRN: [id number] DOB: 05-23-1960 Today's Date: 08/08/2013    History of present illness 53 yo female s/p IRRIGATION AND DEBRIDEMENT RIGHT HAND AND FOREARM (Right). Now s/p skin graft on 08/07/13 to R forearm and hand.   OT comments  Pt seen today for ADL session. Pt was attempting to call Medicaid office to "recertify her Medicaid" and was very anxious to complete her calls. Pt demonstrates with anxious behavior and reports suspicions that her family may be causing her infection. OT explained the role of a Education officer, museum and encouraged pt to discuss her concerns with Education officer, museum, however pt declined at this time. Pt also anxious about discharge as she is worried about her infection returning. Education provided to pt for energy conservation and stress management techniques, including deep breathing and visual imagery. Feel that pt would benefit from continued OT services to address ADLs while she is inpatient.    Follow Up Recommendations  Supervision/Assistance - 24 hour (Outpatient OT when MD feels appropriate)    Equipment Recommendations  None recommended by OT       Precautions / Restrictions Restrictions Weight Bearing Restrictions: Yes RUE Weight Bearing: Non weight bearing       Mobility Bed Mobility  Not assessed- pt in recliner.                Transfers Overall transfer level: Needs assistance Equipment used: None Transfers: Sit to/from Stand Sit to Stand: Supervision                  ADL Overall ADL's : Needs assistance/impaired Eating/Feeding: Set up;Sitting   Grooming: Set up;Sitting                   Toilet Transfer: Supervision/safety;Ambulation (sit<>stand from recliner)           Functional mobility during ADLs: Supervision/safety                  Cognition  Arousal/alertness: Awake/alert Behavior During Therapy: Anxious Overall Cognitive Status:  Within Functional Limits for tasks assessed Area of Impairment: Safety/judgement          Safety/Judgement: Decreased awareness of safety     General Comments: Pt now alert, however very anxious and reports that she feels someone may be trying to poison her causing the infection. Pt reports suspicions regarding her family.       Exercises General Exercises - Upper Extremity Digit Composite Flexion: AROM;Right;10 reps;Seated Composite Extension: AROM;Right;10 reps;Seated           Pertinent Vitals/ Pain       No c/o pain. Provided ice and repositioned for comfort.          Frequency Min 2X/week     Progress Toward Goals  OT Goals(current goals can now be found in the care plan section)  Progress towards OT goals: Progressing toward goals (slowly)     Plan Discharge plan remains appropriate       End of Session Equipment Utilized During Treatment: Other (comment) (sling)   Activity Tolerance Patient tolerated treatment well (however was anxious about making phone calls to recertify me)   Patient Left in chair;with call bell/phone within reach           Time: 1425-1453 OT Time Calculation (min): 28 min  Charges: OT General Charges $OT Visit: 1 Procedure OT Treatments $Self Care/Home Management : 8-22 mins $Therapeutic Exercise: 8-22 mins  Juluis Rainier [phone] 08/08/2013,  4:26 PM

## 2013-08-08 NOTE — Progress Notes (Signed)
Patient's left hand has healed.  Modified care order instructions order to move digits frequently.  Reported to second shift nurse instructions.  Nsg to continue to monitor for status changes.

## 2013-08-08 NOTE — Progress Notes (Signed)
Await therapy consult evaluation and recommendations and followup with formal rehabilitation consult at that time

## 2013-08-08 NOTE — Progress Notes (Addendum)
Patient right hand wound has no s/s of pain, edema or redness. MD will perform dressing change tomorrow 08/09/2013 to graft site, per verbal order from MD.  All orders to left hand to be discontinued this evening.

## 2013-08-09 LAB — ANAEROBIC CULTURE: Gram Stain: NONE SEEN

## 2013-08-09 MED ORDER — POTASSIUM CHLORIDE CRYS ER 20 MEQ PO TBCR
40.0000 meq | EXTENDED_RELEASE_TABLET | Freq: Once | ORAL | Status: AC
  Administered 2013-08-09: 40 meq via ORAL
  Filled 2013-08-09: qty 2

## 2013-08-09 NOTE — Progress Notes (Signed)
PT Cancellation and Discharge Note  Patient Details Name: [name] MRN: [id number] DOB: April 28, 1960   Cancelled Treatment:    Reason Eval/Treat Not Completed: PT screened, no needs identified, will sign off.  Pt observed up ambulating in hallway without A talking with RN.  Pt noted having just washed her own hair and having to do things by herself in her room.  Since pt is able to mobilize independently, feel pt does not need formal PT evaluation at this time.  If mobility status changes please re-consult.  Thanks.     [place], PT [phone] 08/09/2013, 1:25 PM

## 2013-08-09 NOTE — Progress Notes (Signed)
Physical medicine rehabilitation consult requested chart reviewed. Patient status post I&D for compartment syndrome right upper extremity 08/07/2013. She has been evaluated by occupational therapy with recommendations of outpatient therapy. Patient does not meet medical necessity for inpatient rehabilitation services at this time. All issues in regards to this were discussed with patient. Recommendations are for outpatient therapy. Hold on formal rehabilitation consult at this time.

## 2013-08-09 NOTE — Op Note (Signed)
NAMEELINA, STRENG NO.:  [id number]  MEDICAL RECORD NO.:  [id number]  LOCATION:                                 FACILITY:  PHYSICIAN:  Satira Anis. Jayland Null, M.D.DATE OF BIRTH:  15-Sep-1960  DATE OF PROCEDURE:  08/07/2013 DATE OF DISCHARGE:                              OPERATIVE REPORT   PREOPERATIVE DIAGNOSIS:  Compartment syndrome, right forearm and hand, status post compartment release and irrigation and debridement.  The patient presents for repeat I and D, possible grafting.  POSTOPERATIVE DIAGNOSIS:  Compartment syndrome, right forearm and hand, status post compartment release and irrigation and debridement.  The patient presents for repeat I and D, possible grafting.  PROCEDURES: 1. Irrigation debridement of skin, subcutaneous tissue, and muscle.     This was an excisional debridement about the forearm and the thenar     region.  These were 2 separate areas.  Debridement with scissor     tip, knife, blade, and curette was performed without difficulty,     and this was excisional in nature. 2. Split thickness skin graft, volar forearm 8 x 4 inches. 3. Median nerve neurolysis, right forearm. 4. A 3 x 2 cm split thickness skin grafting, thenar region right     thumb. 5. Harvesting split-thickness skin graft from the right thigh.  SURGEON:  Satira Anis. Amedeo Plenty, M.D.  ASSISTANT:  Avelina Laine, P.A.-C.  COMPLICATIONS:  None in regard to the right upper extremity.  The patient did have IV infiltration of her left hand noted by anesthesia. At the conclusion of the case, there was a small amount of Neo- Synephrine and likely some degree of vancomycin infused in this area. We thoroughly washed the area.  The tips had good refill.  We treated this with Neosporin and a gentle non-compressive wrap.  She had an IV line started in her left foot at my request to stay away from the arm. We will likely consider a PICC line if the swelling goes down and parameters  are acceptable.  OPERATIVE FINDINGS:  This patient had obvious sequelae of subcu compartment syndrome.  She presented Friday night with claw hand and a subacute compartment syndrome.  She had a fall on Wednesday.  She likely did not have any suitable treatment until Friday when she presented to the emergency room.  She is on chronic pain medicine.  She is very elusive on all the details since Wednesday, but states she slipped on a hard floor.  I have yet to amenable able to piece all the portions of her history together from her she cannot recall or regurgitate all of the specifics.  Nevertheless, she presented with a cellulitic arm with lymphangitis and subcu compartment syndrome.  We performed fasciotomies about the hand and forearm, as well as carpal tunnel release.  She has remained with poor sensation to the hand, good refill, and her muscles have been stable.  She did have some devitalized musculature about the FDS muscle belly, which I debrided today.  I also noted her median nerve was stable.  I did perform a neurolysis in the forearm.  She is looked better and her white count is normalized and her treatment seems to be  satisfactory; however, I would note that she is going to have severe disability with this process due to the fact that this was subacute and there is no way to give her all of the neurovascular function back in a female of her age and of her challenges.  OPERATION IN DETAIL:  The patient was seen by myself and Anesthesia, taken to the operative suite, underwent smooth induction of general anesthesia, laid spine.  LMA general anesthetic was induced.  I supervised as well as Mr. Jenean Lindau, physician assistant.  The Betadine scrub and paint to the right upper extremity and right thigh.  Following this, we have secured to a sterile field.  Time-out was called.  Pre and postop check lists were completed.  The patient underwent irrigation and debridement of skin,  subcutaneous tissue, muscle, and peritendinous regions about the volar forearm and thenar region.  This was an excisional debridement with curette, knife, and scissor tip.  She had devitalized and pre-necrotic muscle tissue, which I excised sharply.  This was a major portion of the FDS muscle belly.  The median nerve underwent neurolysis.  Following this, 3 L of saline were placed through the wounds.  Following this, we harvested with a dermatome, a split-thickness skin graft, mashed this 1.5 to 1 and then insert the skin graft overlying good healthy muscle tissue.  We used a chromic running suture as well as Prolene for the cotton bolster ties.  Following this, Mepitel was placed against this split-thickness skin graft sites about the thenar region and the forearm.  The dimensions are noted above at about 4 inches in the forearm and 2 x 3 cm in the thenar musculature.  Following med detail, we placed Xeroform, cotton bolster, and gauze.  The ties of Prolene placed over the areas to give a slight bit of compression help our skin graft have viability.  She maintained good refill, soft compartments.  No complications.  We dressed with Adaptic, Xeroform, tried to treat the skin as best we could.  The patient was placed in a long-arm splint without difficulty.  The harvest site against the right thigh was treated with some epi- soaked saline followed by placement of Xeroform, Mepitel, and an ABD and an Ace wrap.  At this time, the anesthesiologist noted an infiltrate about her left hand, it was perfused, but certainly, there was a significant infiltrate.  At this time, they made the decision to place an IV in the left foot.  I requested they not go to the antecubital fossa.  The left foot IV was scrubbed without difficulty.  Myself and Mr. Jenean Lindau personally cleansed the left hand and looked that had good refill, but will need to maximum elevate this and monitor very closely. The  anesthesiologists are aware of this.  We are going to keep her in the hospital.  I will consider a PICC line and continue very close and careful observation due to the fact that I would give her a very concerning prognosis in regard to how she does. Certainly, she has regained some degree of flexion and extension of the fingers; however, I have major concerns going forward how well she truly did.  This is a very unfortunate case.  Certainly, the infection and cellulitic lymphangitis issues have markedly improved; however, she is left with a subacute compartment syndrome, which is what she presented with.  I am very pleased we swiftly moved her to the operative arena for decompressive fasciotomies.  Today we saw her (with an  hour of course as soon as the OR was available), but I do feel that given the timeframe duration from her initial injury in the delayed presentation as well as intraoperative findings she is going to have major disability with this injury process.  I have told this to her on multiple occasions.  Hopefully, this is synching in to some degree.  However, I think her capacity to demonstrate knowledge of the process is quite limited to some degree.  Nevertheless, we will keep trying to educate her, rehabilitate her, and give her the best functional extremity possible.     Satira Anis. Amedeo Plenty, M.D.   ______________________________ Satira Anis. Amedeo Plenty, M.D.    [facility]  D:  08/07/2013  T:  08/08/2013  Job:  [id number]

## 2013-08-09 NOTE — Progress Notes (Signed)
Subjective: 2 Days Post-Op Procedure(s) (LRB): IRRIGATION AND DEBRIDEMENT RIGHT ARM, FOREARM, HAND AND SKIN GRAFTING AS NECESSARY TO AFECTED AREAS (FOREARM AND HAND ) (Right) AND SKIN GRAFTING AS NECESSARY TO AFECTED AREAS (FOREARM AND HAND ) (Right) Patient reports pain as mild.    She is without complications. She is stable. She denies a long talk at bedside about all issues as they are germane her upper extremity predicament  She is tolerating a diet well.  She is voiding well. She denies neck or back pain this juncture.  Objective: Vital signs in last 24 hours: Temp:  [98 F (36.7 C)] 98 F (36.7 C) (04/16 0505) Pulse Rate:  [55-62] 55 (04/16 0505) Resp:  [16] 16 (04/16 0505) BP: (111-113)/(54-57) 111/55 mmHg (04/16 0505) SpO2:  [93 %-95 %] 93 % (04/16 0505)  Intake/Output from previous day:   Intake/Output this shift:     Recent Labs  08/07/13 0505 08/08/13 0422  HGB 12.6 12.4    Recent Labs  08/07/13 0505 08/08/13 0422  WBC 7.0 9.0  RBC 4.18 4.14  HCT 37.8 37.5  PLT 283 317    Recent Labs  08/07/13 0505 08/08/13 0422  NA 140 140  K 4.3 3.4*  CL 100 99  CO2 28 28  BUN 8 9  CREATININE 0.79 0.78  GLUCOSE 99 102*  CALCIUM 9.0 9.0   No results found for this basename: LABPT, INR,  in the last 72 hours Physical exam: Her skin graft harvest site looks good we've initiated hairdryer treatments and I've shown she and her nurses have to perform the activities at bedside over 30-45 minute time frame  She is neurovascular intact in the lower extremities. Left upper extremity looks excellent. The left hand which had an IV infiltrate his now normal and stable.  She has no signs of dystrophy no signs of infection in the left arm. The right upper extremity is bandaged the range of motion is improved in her fingers but certainly very different from a normal upper extremity. ABD soft No cellulitis present Compartment soft Chest is clear heart is regular rate  HEENT is within normal limits.  I performed a thorough exam today.  Assessment/Plan: 2 Days Post-Op Procedure(s) (LRB): IRRIGATION AND DEBRIDEMENT RIGHT ARM, FOREARM, HAND AND SKIN GRAFTING AS NECESSARY TO AFECTED AREAS (FOREARM AND HAND ) (Right) AND SKIN GRAFTING AS NECESSARY TO AFECTED AREAS (FOREARM AND HAND ) (Right) Patient and I discussed her disposition predicament. We will await decision as to DC home with home health or DC to an extended care facility.  I feel she will need twice daily visits for her skin graft and upper extremity care. I would also recommend range of motion massage and desensitization to the fingers.  She will need to be discharged home on pain medicine. We will attempt to call her pain management team. In addition to this she will need to be on by mouth antibiotics.  She is leaning towards trying to go home with home health.  Overall she's actually done very well with her recovery. I discussed her I would give the right upper extremity a somewhat guarded prognosis. I should note that she does not have any obvious complications at this juncture.  I discussed with her meaningful expectations in terms of arm use and function.  I discussed her meaningful issues as they are to remain in her predicament. I predict 50% recovery in terms of functional capabilities to her arm at best and I did let her know  this.  Her white count is normal and I will DC further labs.  We'll DC her IV tomorrow and convert to by mouth antibiotics   08/09/2013, 9:13 AM

## 2013-08-09 NOTE — Progress Notes (Addendum)
ANTIBIOTIC CONSULT NOTE - FOLLOW UP  Pharmacy Consult for vancomycin and Zosyn Indication: cellulitis  Allergies  Allergen Reactions  . Vicodin [Hydrocodone-Acetaminophen] Itching    Patient Measurements: Height: 5\' 4"  (162.6 cm) Weight: 180 lb (81.647 kg) IBW/kg (Calculated) : 54.7  Vital Signs: Temp: 98 F (36.7 C) (04/16 1225) Temp src: Oral (04/16 1225) BP: 125/46 mmHg (04/16 1225) Pulse Rate: 76 (04/16 1225) Intake/Output from previous day:   Intake/Output from this shift:    Labs:  Recent Labs  08/07/13 0505 08/08/13 0422  WBC 7.0 9.0  HGB 12.6 12.4  PLT 283 317  CREATININE 0.79 0.78   Estimated Creatinine Clearance: 83.1 ml/min (by C-G formula based on Cr of 0.78).  Recent Labs  08/06/13 2102  [place] 15.1     Microbiology: Recent Results (from the past 720 hour(s))  URINE CULTURE     Status: None   Collection Time    07/11/13  6:15 PM      Result Value Ref Range Status   Specimen Description URINE, CLEAN CATCH   Final   Special Requests NONE   Final   Culture  Setup Time     Final   Value: 07/12/2013 01:53     Performed at [place]     Final   Value: NO GROWTH     Performed at [facility]   Culture     Final   Value: NO GROWTH     Performed at [facility]   Report Status 07/12/2013 FINAL   Final  CULTURE, BLOOD (ROUTINE X 2)     Status: None   Collection Time    08/03/13  9:30 PM      Result Value Ref Range Status   Specimen Description BLOOD RIGHT ANTECUBITAL   Final   Special Requests BOTTLES DRAWN AEROBIC AND ANAEROBIC 8CC   Final   Culture NO GROWTH 5 DAYS   Final   Report Status 08/08/2013 FINAL   Final  CULTURE, BLOOD (ROUTINE X 2)     Status: None   Collection Time    08/03/13  9:35 PM      Result Value Ref Range Status   Specimen Description BLOOD LEFT WRIST   Final   Special Requests BOTTLES DRAWN AEROBIC ONLY 6CC   Final   Culture NO GROWTH 5 DAYS   Final   Report Status  08/08/2013 FINAL   Final  ANAEROBIC CULTURE     Status: None   Collection Time    08/04/13  2:02 AM      Result Value Ref Range Status   Specimen Description WOUND RIGHT ARM   Final   Special Requests PATIENT ON FOLLOWING VANCOMYCIN AND ZOCYN   Final   Gram Stain     Final   Value: NO WBC SEEN     RARE SQUAMOUS EPITHELIAL CELLS PRESENT     NO ORGANISMS SEEN     Performed at [facility]   Culture     Final   Value: NO ANAEROBES ISOLATED     Performed at [facility]   Report Status 08/09/2013 FINAL   Final  WOUND CULTURE     Status: None   Collection Time    08/04/13  2:02 AM      Result Value Ref Range Status   Specimen Description WOUND RIGHT ARM   Final   Special Requests PATIENT ON FOLLOWING VANCOMYCIN AND ZOCYN   Final  Gram Stain     Final   Value: NO WBC SEEN     NO SQUAMOUS EPITHELIAL CELLS SEEN     NO ORGANISMS SEEN     Performed at [facility]   Culture     Final   Value: NO GROWTH 2 DAYS     Performed at [facility]   Report Status 08/06/2013 FINAL   Final  SURGICAL PCR SCREEN     Status: None   Collection Time    08/07/13  6:25 AM      Result Value Ref Range Status   MRSA, PCR NEGATIVE  NEGATIVE Final   Staphylococcus aureus NEGATIVE  NEGATIVE Final   Comment:            The Xpert SA Assay (FDA     approved for NASAL specimens     in patients over 7 years of age),     is one component of     a comprehensive surveillance     program.  Test performance has     been validated by [employer] for patients greater     than or equal to 60 year old.     It is not intended     to diagnose infection nor to     guide or monitor treatment.   Assessment: 53 yo female with cellulitis is currently on therapeutic vancomycin.  Vancomycin trough drawn on the 13th was therapeutic at 68mcg/mL. Noted in orthopedic note today the plan to change to PO antibiotics tomorrow. WBC normal, she is afebrile. Renal function remains  stable  Goal of Therapy:  Vancomycin trough level 10-15 mcg/ml  Plan:  1. Continue vancomycin 1g IV q12h 2. Continue Zosyn 3.375g IV q8h EI 3. Follow up for transition to PO antibiotics and renal function  Luanna Weesner D. Aubri Gathright, PharmD, BCPS Clinical Pharmacist Pager: [phone] 08/09/2013 2:27 PM

## 2013-08-09 NOTE — Progress Notes (Signed)
Patient ID: [name]  female  VHQ:[id number]    DOB: 01-24-1961    DOA: 08/03/2013  PCP: Lanette Hampshire, MD  Assessment/Plan:  Compartment syndrome  -Orthopedic surgery( primary), Dr. Amedeo Plenty, following  -Underwent Irrigation and debridement of the right hand and forearm and Fasciotomy on 4/11  -Underwent repeat I&D 4/13 by Dr. Amedeo Plenty  -Continue pain control per ortho.   Cellulitis of the right hand and forearm  -Continue vancomycin and zosyn   Chronic pain syndrome  -Multifactorial: Osteoarthropathy versus cervical disc disease and lumbago  -Continue Cymbalta and hopefully pain regimen   Benzodiazepine addiction  -Continue Xanax twice a day PRN to prevent withdrawal   Hypertension  -Continue triamterene and HCTZ   Peptic ulcer disease  -Continue PPI   Bipolar disorder  -Currently on no medications   Depression and anxiety  -Continue duloxetine   Tobacco abuse  -Continue nicotine patch   Hypokalemia  Replaced  DVT Prophylaxis:  Code Status:  Family Communication: Discussed in detail with the patient  Disposition:  Antibiotics:  Vancomycin, Zosyn     Subjective: Patient seen and examined, anxious, states hand is still hurting  Objective: Weight change:  No intake or output data in the 24 hours ending 08/09/13 1536 Blood pressure 125/46, pulse 76, temperature 98 F (36.7 C), temperature source Oral, resp. rate 16, height 5\' 4"  (1.626 m), weight 81.647 kg (180 lb), last menstrual period 09/16/2000, SpO2 94.00%.  Physical Exam: General: Alert and awake, oriented x3, not in any acute distress. CVS: S1-S2 clear, no murmur rubs or gallops Chest: clear to auscultation bilaterally, no wheezing, rales or rhonchi Abdomen: soft nontender, nondistended, normal bowel sounds  Extremities: no cyanosis, clubbing or edema noted bilaterally dressing intact on the right upper arm Neuro: Cranial nerves II-XII intact, no focal neurological deficits  Lab  Results: Basic Metabolic Panel:  Recent Labs Lab 08/07/13 0505 08/08/13 0422  NA 140 140  K 4.3 3.4*  CL 100 99  CO2 28 28  GLUCOSE 99 102*  BUN 8 9  CREATININE 0.79 0.78  CALCIUM 9.0 9.0   Liver Function Tests: No results found for this basename: AST, ALT, ALKPHOS, BILITOT, PROT, ALBUMIN,  in the last 168 hours No results found for this basename: LIPASE, AMYLASE,  in the last 168 hours No results found for this basename: AMMONIA,  in the last 168 hours CBC:  Recent Labs Lab 08/07/13 0505 08/08/13 0422  WBC 7.0 9.0  NEUTROABS 3.3 5.3  HGB 12.6 12.4  HCT 37.8 37.5  MCV 90.4 90.6  PLT 283 317   Cardiac Enzymes: No results found for this basename: CKTOTAL, CKMB, CKMBINDEX, TROPONINI,  in the last 168 hours BNP: No components found with this basename: POCBNP,  CBG: No results found for this basename: GLUCAP,  in the last 168 hours   Micro Results: Recent Results (from the past 240 hour(s))  CULTURE, BLOOD (ROUTINE X 2)     Status: None   Collection Time    08/03/13  9:30 PM      Result Value Ref Range Status   Specimen Description BLOOD RIGHT ANTECUBITAL   Final   Special Requests BOTTLES DRAWN AEROBIC AND ANAEROBIC [place]   Final   Culture NO GROWTH 5 DAYS   Final   Report Status 08/08/2013 FINAL   Final  CULTURE, BLOOD (ROUTINE X 2)     Status: None   Collection Time    08/03/13  9:35 PM      Result Value  Ref Range Status   Specimen Description BLOOD LEFT WRIST   Final   Special Requests BOTTLES DRAWN AEROBIC ONLY 6CC   Final   Culture NO GROWTH 5 DAYS   Final   Report Status 08/08/2013 FINAL   Final  ANAEROBIC CULTURE     Status: None   Collection Time    08/04/13  2:02 AM      Result Value Ref Range Status   Specimen Description WOUND RIGHT ARM   Final   Special Requests PATIENT ON FOLLOWING VANCOMYCIN AND ZOCYN   Final   Gram Stain     Final   Value: NO WBC SEEN     RARE SQUAMOUS EPITHELIAL CELLS PRESENT     NO ORGANISMS SEEN     Performed at [facility]   Culture     Final   Value: NO ANAEROBES ISOLATED     Performed at [facility]   Report Status 08/09/2013 FINAL   Final  WOUND CULTURE     Status: None   Collection Time    08/04/13  2:02 AM      Result Value Ref Range Status   Specimen Description WOUND RIGHT ARM   Final   Special Requests PATIENT ON FOLLOWING VANCOMYCIN AND ZOCYN   Final   Gram Stain     Final   Value: NO WBC SEEN     NO SQUAMOUS EPITHELIAL CELLS SEEN     NO ORGANISMS SEEN     Performed at [facility]   Culture     Final   Value: NO GROWTH 2 DAYS     Performed at [facility]   Report Status 08/06/2013 FINAL   Final  SURGICAL PCR SCREEN     Status: None   Collection Time    08/07/13  6:25 AM      Result Value Ref Range Status   MRSA, PCR NEGATIVE  NEGATIVE Final   Staphylococcus aureus NEGATIVE  NEGATIVE Final   Comment:            The Xpert SA Assay (FDA     approved for NASAL specimens     in patients over 13 years of age),     is one component of     a comprehensive surveillance     program.  Test performance has     been validated by [employer] for patients greater     than or equal to 35 year old.     It is not intended     to diagnose infection nor to     guide or monitor treatment.    Studies/Results: Dg Elbow Complete Right  07/11/2013   CLINICAL DATA:  [place].  Elbow pain.  EXAM: RIGHT ELBOW - COMPLETE 3+ VIEW  FINDINGS: The joint spaces are maintained. No acute fracture or joint effusion. No osteochondral abnormality.  IMPRESSION: No acute bony findings or joint effusion.   Electronically Signed   By: Kalman Jewels M.D.   On: 07/11/2013 17:25   Dg Forearm Right  08/03/2013   CLINICAL DATA:  CELLULITIS  EXAM: RIGHT WRIST - COMPLETE 3+ VIEW; RIGHT HAND - COMPLETE 3+ VIEW; RIGHT FOREARM - 2 VIEW  COMPARISON:  DG ELBOW COMPLETE*R* dated 07/11/2013  FINDINGS: There is no evidence of fracture or dislocation. There is no evidence of arthropathy or other  focal bone abnormality. Wrist and hand soft tissue swelling. Anterior forearm skin irregularity without subcutaneous gas  or radiopaque foreign bodies.  IMPRESSION: No fracture deformity,, dislocation or destructive bony lesions.  Soft tissue swelling of the anterior forearm skin irregularity, no subcutaneous gas or radiopaque foreign bodies.   Electronically Signed   By: Elon Alas   On: 08/03/2013 20:26   Dg Wrist Complete Right  08/03/2013   CLINICAL DATA:  CELLULITIS  EXAM: RIGHT WRIST - COMPLETE 3+ VIEW; RIGHT HAND - COMPLETE 3+ VIEW; RIGHT FOREARM - 2 VIEW  COMPARISON:  DG ELBOW COMPLETE*R* dated 07/11/2013  FINDINGS: There is no evidence of fracture or dislocation. There is no evidence of arthropathy or other focal bone abnormality. Wrist and hand soft tissue swelling. Anterior forearm skin irregularity without subcutaneous gas or radiopaque foreign bodies.  IMPRESSION: No fracture deformity,, dislocation or destructive bony lesions.  Soft tissue swelling of the anterior forearm skin irregularity, no subcutaneous gas or radiopaque foreign bodies.   Electronically Signed   By: Elon Alas   On: 08/03/2013 20:26   Ct Head Wo Contrast  07/11/2013   CLINICAL DATA:  Neck pain.  Trauma.  Headache.  EXAM: CT HEAD WITHOUT CONTRAST  CT CERVICAL SPINE WITHOUT CONTRAST  TECHNIQUE: Multidetector CT imaging of the head and cervical spine was performed following the standard protocol without intravenous contrast. Multiplanar CT image reconstructions of the cervical spine were also generated.  COMPARISON:  06/01/2005  FINDINGS: CT HEAD FINDINGS  The brain has normal appearance without evidence of old or acute infarction, mass lesion, hemorrhage, hydrocephalus or extra-axial collection. No skull fracture. No fluid in the sinuses.  CT CERVICAL SPINE FINDINGS  Alignment is normal. No fracture or soft tissue swelling. 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 No stenosis of the canal or  foramina identified. Lung apices show abnormal density possibly related to respiratory bronchiolitis. Not completely evaluated.  IMPRESSION: Head CT:  Normal.  Cervical spine CT: Minimal degenerative changes. No acute or traumatic finding.   Electronically Signed   By: Nelson Chimes M.D.   On: 07/11/2013 16:51   Ct Cervical Spine Wo Contrast  07/11/2013   CLINICAL DATA:  Neck pain.  Trauma.  Headache.  EXAM: CT HEAD WITHOUT CONTRAST  CT CERVICAL SPINE WITHOUT CONTRAST  TECHNIQUE: Multidetector CT imaging of the head and cervical spine was performed following the standard protocol without intravenous contrast. Multiplanar CT image reconstructions of the cervical spine were also generated.  COMPARISON:  06/01/2005  FINDINGS: CT HEAD FINDINGS  The brain has normal appearance without evidence of old or acute infarction, mass lesion, hemorrhage, hydrocephalus or extra-axial collection. No skull fracture. No fluid in the sinuses.  CT CERVICAL SPINE FINDINGS  Alignment is normal. No fracture or soft tissue swelling. 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 No stenosis of the canal or foramina identified. Lung apices show abnormal density possibly related to respiratory bronchiolitis. Not completely evaluated.  IMPRESSION: Head CT:  Normal.  Cervical spine CT: Minimal degenerative changes. No acute or traumatic finding.   Electronically Signed   By: Nelson Chimes M.D.   On: 07/11/2013 16:51   Dg Hand Complete Right  08/03/2013   CLINICAL DATA:  CELLULITIS  EXAM: RIGHT WRIST - COMPLETE 3+ VIEW; RIGHT HAND - COMPLETE 3+ VIEW; RIGHT FOREARM - 2 VIEW  COMPARISON:  DG ELBOW COMPLETE*R* dated 07/11/2013  FINDINGS: There is no evidence of fracture or dislocation. There is no evidence of arthropathy or other focal bone abnormality. Wrist and hand soft tissue swelling. Anterior forearm skin irregularity without subcutaneous gas or radiopaque  foreign bodies.  IMPRESSION: No fracture deformity,, dislocation or  destructive bony lesions.  Soft tissue swelling of the anterior forearm skin irregularity, no subcutaneous gas or radiopaque foreign bodies.   Electronically Signed   By: Elon Alas   On: 08/03/2013 20:26    Medications: Scheduled Meds: . DULoxetine  60 mg Oral QHS  . nicotine  7 mg Transdermal Daily  . pantoprazole  40 mg Oral Daily  . piperacillin-tazobactam (ZOSYN)  IV  3.375 g Intravenous Q8H  . triamterene-hydrochlorothiazide  1 tablet Oral q morning - 10a  . vancomycin  1,000 mg Intravenous Q12H  . vitamin C  1,000 mg Oral Daily      LOS: 6 days   Ripudeep Krystal Eaton M.D. [facility] 08/09/2013, 3:36 PM Pager: [phone]  If 7PM-7AM, please contact night-coverage [email or web address] Password [email or web address]  **Disclaimer: This note was dictated with voice recognition software. Similar sounding words can inadvertently be transcribed and this note may contain transcription errors which may not have been corrected upon publication of note.**

## 2013-08-10 MED ORDER — ALPRAZOLAM 1 MG PO TABS: 1.0000 mg | ORAL_TABLET | Freq: Two times a day (BID) | ORAL | Status: AC | PRN

## 2013-08-10 MED ORDER — CYCLOBENZAPRINE HCL 10 MG PO TABS: 10.0000 mg | ORAL_TABLET | Freq: Three times a day (TID) | ORAL | Status: DC | PRN

## 2013-08-10 MED ORDER — CEPHALEXIN 500 MG PO CAPS
500.0000 mg | ORAL_CAPSULE | Freq: Four times a day (QID) | ORAL | Status: DC
Start: 2013-08-10 — End: 2014-12-18

## 2013-08-10 MED ORDER — OXYCODONE HCL 30 MG PO TABS
30.0000 mg | ORAL_TABLET | Freq: Four times a day (QID) | ORAL | Status: DC | PRN
Start: 2013-08-10 — End: 2014-12-18

## 2013-08-10 NOTE — Progress Notes (Signed)
Chaplain spent time with pt and offered pastoral counseling.  Pt receptive and appreciative of the visit.  Pt has some home issues and concerns with her family.  Chaplain offered emotional support and prayer. Pt was grateful.

## 2013-08-10 NOTE — Progress Notes (Signed)
Patient ID: [name]  female  CVE:[id number]    DOB: May 04, 1960    DOA: 08/03/2013  PCP: Lanette Hampshire, MD  Assessment/Plan:  Compartment syndrome  -Orthopedic surgery( primary), Dr. Amedeo Plenty, following, continue pain control per orthopedics -Underwent Irrigation and debridement of the right hand and forearm and Fasciotomy on 4/11  -Underwent repeat I&D 4/13 by Dr. Amedeo Plenty  -Continue vancomycin and Zosyn  Cellulitis of the right hand and forearm  -Continue vancomycin and zosyn   Chronic pain syndrome  -Multifactorial: Osteoarthropathy versus cervical disc disease and lumbago  -Continue Cymbalta and hopefully pain regimen   Benzodiazepine addiction  -Continue Xanax twice a day PRN to prevent withdrawal   Hypertension  -Continue triamterene and HCTZ   Peptic ulcer disease  -Continue PPI   Bipolar disorder  -Currently on no medications   Depression and anxiety  -Continue duloxetine   Tobacco abuse  -Continue nicotine patch    DVT Prophylaxis:  Code Status:  Family Communication: Discussed in detail with the patient  Disposition: Patient is stating that she is going home today, she is stable from a medical standpoint. I explained it to her that it will be her primary orthopedics team deciding her discharge planning when she is ready.  Her home medications including her psych medications and antihypertensives should be continued as they currently are. I will sign off. Please consult [facility] service for any questions.  Antibiotics:  Vancomycin, Zosyn     Subjective: Patient seen and examined, eating her lunch, pleasant, denies any specific complaints  Objective: Weight change:   Intake/Output Summary (Last 24 hours) at 08/10/13 1418 Last data filed at 08/09/13 2200  Gross per 24 hour  Intake    120 ml  Output      0 ml  Net    120 ml   Blood pressure 141/61, pulse 63, temperature 98.1 F (36.7 C), temperature source Oral, resp. rate 16, height 5\' 4"   (1.626 m), weight 81.647 kg (180 lb), last menstrual period 09/16/2000, SpO2 97.00%.  Physical Exam: General: Alert and awake, oriented x3,NAD CVS: S1-S2 clear, no murmur rubs or gallops Chest: CTAB Abdomen: soft nontender, nondistended, normal bowel sounds  Extremities: no cyanosis, clubbing or edema, dressing intact on the right upper arm  Lab Results: Basic Metabolic Panel:  Recent Labs Lab 08/07/13 0505 08/08/13 0422  NA 140 140  K 4.3 3.4*  CL 100 99  CO2 28 28  GLUCOSE 99 102*  BUN 8 9  CREATININE 0.79 0.78  CALCIUM 9.0 9.0   Liver Function Tests: No results found for this basename: AST, ALT, ALKPHOS, BILITOT, PROT, ALBUMIN,  in the last 168 hours No results found for this basename: LIPASE, AMYLASE,  in the last 168 hours No results found for this basename: AMMONIA,  in the last 168 hours CBC:  Recent Labs Lab 08/07/13 0505 08/08/13 0422  WBC 7.0 9.0  NEUTROABS 3.3 5.3  HGB 12.6 12.4  HCT 37.8 37.5  MCV 90.4 90.6  PLT 283 317   Cardiac Enzymes: No results found for this basename: CKTOTAL, CKMB, CKMBINDEX, TROPONINI,  in the last 168 hours BNP: No components found with this basename: POCBNP,  CBG: No results found for this basename: GLUCAP,  in the last 168 hours   Micro Results: Recent Results (from the past 240 hour(s))  CULTURE, BLOOD (ROUTINE X 2)     Status: None   Collection Time    08/03/13  9:30 PM      Result Value  Ref Range Status   Specimen Description BLOOD RIGHT ANTECUBITAL   Final   Special Requests BOTTLES DRAWN AEROBIC AND ANAEROBIC 8CC   Final   Culture NO GROWTH 5 DAYS   Final   Report Status 08/08/2013 FINAL   Final  CULTURE, BLOOD (ROUTINE X 2)     Status: None   Collection Time    08/03/13  9:35 PM      Result Value Ref Range Status   Specimen Description BLOOD LEFT WRIST   Final   Special Requests BOTTLES DRAWN AEROBIC ONLY 6CC   Final   Culture NO GROWTH 5 DAYS   Final   Report Status 08/08/2013 FINAL   Final  ANAEROBIC  CULTURE     Status: None   Collection Time    08/04/13  2:02 AM      Result Value Ref Range Status   Specimen Description WOUND RIGHT ARM   Final   Special Requests PATIENT ON FOLLOWING VANCOMYCIN AND ZOCYN   Final   Gram Stain     Final   Value: NO WBC SEEN     RARE SQUAMOUS EPITHELIAL CELLS PRESENT     NO ORGANISMS SEEN     Performed at [facility]   Culture     Final   Value: NO ANAEROBES ISOLATED     Performed at [facility]   Report Status 08/09/2013 FINAL   Final  WOUND CULTURE     Status: None   Collection Time    08/04/13  2:02 AM      Result Value Ref Range Status   Specimen Description WOUND RIGHT ARM   Final   Special Requests PATIENT ON FOLLOWING VANCOMYCIN AND ZOCYN   Final   Gram Stain     Final   Value: NO WBC SEEN     NO SQUAMOUS EPITHELIAL CELLS SEEN     NO ORGANISMS SEEN     Performed at [facility]   Culture     Final   Value: NO GROWTH 2 DAYS     Performed at [facility]   Report Status 08/06/2013 FINAL   Final  SURGICAL PCR SCREEN     Status: None   Collection Time    08/07/13  6:25 AM      Result Value Ref Range Status   MRSA, PCR NEGATIVE  NEGATIVE Final   Staphylococcus aureus NEGATIVE  NEGATIVE Final   Comment:            The Xpert SA Assay (FDA     approved for NASAL specimens     in patients over 23 years of age),     is one component of     a comprehensive surveillance     program.  Test performance has     been validated by [employer] for patients greater     than or equal to 85 year old.     It is not intended     to diagnose infection nor to     guide or monitor treatment.    Studies/Results: Dg Elbow Complete Right  07/11/2013   CLINICAL DATA:  [place].  Elbow pain.  EXAM: RIGHT ELBOW - COMPLETE 3+ VIEW  FINDINGS: The joint spaces are maintained. No acute fracture or joint effusion. No osteochondral abnormality.  IMPRESSION: No acute bony findings or joint effusion.   Electronically Signed   By:  Kalman Jewels M.D.   On: 07/11/2013  17:25   Dg Forearm Right  08/03/2013   CLINICAL DATA:  CELLULITIS  EXAM: RIGHT WRIST - COMPLETE 3+ VIEW; RIGHT HAND - COMPLETE 3+ VIEW; RIGHT FOREARM - 2 VIEW  COMPARISON:  DG ELBOW COMPLETE*R* dated 07/11/2013  FINDINGS: There is no evidence of fracture or dislocation. There is no evidence of arthropathy or other focal bone abnormality. Wrist and hand soft tissue swelling. Anterior forearm skin irregularity without subcutaneous gas or radiopaque foreign bodies.  IMPRESSION: No fracture deformity,, dislocation or destructive bony lesions.  Soft tissue swelling of the anterior forearm skin irregularity, no subcutaneous gas or radiopaque foreign bodies.   Electronically Signed   By: Elon Alas   On: 08/03/2013 20:26   Dg Wrist Complete Right  08/03/2013   CLINICAL DATA:  CELLULITIS  EXAM: RIGHT WRIST - COMPLETE 3+ VIEW; RIGHT HAND - COMPLETE 3+ VIEW; RIGHT FOREARM - 2 VIEW  COMPARISON:  DG ELBOW COMPLETE*R* dated 07/11/2013  FINDINGS: There is no evidence of fracture or dislocation. There is no evidence of arthropathy or other focal bone abnormality. Wrist and hand soft tissue swelling. Anterior forearm skin irregularity without subcutaneous gas or radiopaque foreign bodies.  IMPRESSION: No fracture deformity,, dislocation or destructive bony lesions.  Soft tissue swelling of the anterior forearm skin irregularity, no subcutaneous gas or radiopaque foreign bodies.   Electronically Signed   By: Elon Alas   On: 08/03/2013 20:26   Ct Head Wo Contrast  07/11/2013   CLINICAL DATA:  Neck pain.  Trauma.  Headache.  EXAM: CT HEAD WITHOUT CONTRAST  CT CERVICAL SPINE WITHOUT CONTRAST  TECHNIQUE: Multidetector CT imaging of the head and cervical spine was performed following the standard protocol without intravenous contrast. Multiplanar CT image reconstructions of the cervical spine were also generated.  COMPARISON:  06/01/2005  FINDINGS: CT HEAD FINDINGS  The brain  has normal appearance without evidence of old or acute infarction, mass lesion, hemorrhage, hydrocephalus or extra-axial collection. No skull fracture. No fluid in the sinuses.  CT CERVICAL SPINE FINDINGS  Alignment is normal. No fracture or soft tissue swelling. 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 No stenosis of the canal or foramina identified. Lung apices show abnormal density possibly related to respiratory bronchiolitis. Not completely evaluated.  IMPRESSION: Head CT:  Normal.  Cervical spine CT: Minimal degenerative changes. No acute or traumatic finding.   Electronically Signed   By: Nelson Chimes M.D.   On: 07/11/2013 16:51   Ct Cervical Spine Wo Contrast  07/11/2013   CLINICAL DATA:  Neck pain.  Trauma.  Headache.  EXAM: CT HEAD WITHOUT CONTRAST  CT CERVICAL SPINE WITHOUT CONTRAST  TECHNIQUE: Multidetector CT imaging of the head and cervical spine was performed following the standard protocol without intravenous contrast. Multiplanar CT image reconstructions of the cervical spine were also generated.  COMPARISON:  06/01/2005  FINDINGS: CT HEAD FINDINGS  The brain has normal appearance without evidence of old or acute infarction, mass lesion, hemorrhage, hydrocephalus or extra-axial collection. No skull fracture. No fluid in the sinuses.  CT CERVICAL SPINE FINDINGS  Alignment is normal. No fracture or soft tissue swelling. 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 No stenosis of the canal or foramina identified. Lung apices show abnormal density possibly related to respiratory bronchiolitis. Not completely evaluated.  IMPRESSION: Head CT:  Normal.  Cervical spine CT: Minimal degenerative changes. No acute or traumatic finding.   Electronically Signed   By: Nelson Chimes M.D.   On: 07/11/2013 16:51  Dg Hand Complete Right  08/03/2013   CLINICAL DATA:  CELLULITIS  EXAM: RIGHT WRIST - COMPLETE 3+ VIEW; RIGHT HAND - COMPLETE 3+ VIEW; RIGHT FOREARM - 2 VIEW   COMPARISON:  DG ELBOW COMPLETE*R* dated 07/11/2013  FINDINGS: There is no evidence of fracture or dislocation. There is no evidence of arthropathy or other focal bone abnormality. Wrist and hand soft tissue swelling. Anterior forearm skin irregularity without subcutaneous gas or radiopaque foreign bodies.  IMPRESSION: No fracture deformity,, dislocation or destructive bony lesions.  Soft tissue swelling of the anterior forearm skin irregularity, no subcutaneous gas or radiopaque foreign bodies.   Electronically Signed   By: Elon Alas   On: 08/03/2013 20:26    Medications: Scheduled Meds: . DULoxetine  60 mg Oral QHS  . nicotine  7 mg Transdermal Daily  . pantoprazole  40 mg Oral Daily  . piperacillin-tazobactam (ZOSYN)  IV  3.375 g Intravenous Q8H  . triamterene-hydrochlorothiazide  1 tablet Oral q morning - 10a  . vancomycin  1,000 mg Intravenous Q12H  . vitamin C  1,000 mg Oral Daily      LOS: 7 days   Niang [place] Krystal Eaton M.D. [facility] 08/10/2013, 2:18 PM Pager: [phone]  If 7PM-7AM, please contact night-coverage [email or web address] Password [email or web address]  **Disclaimer: This note was dictated with voice recognition software. Similar sounding words can inadvertently be transcribed and this note may contain transcription errors which may not have been corrected upon publication of note.**

## 2013-08-10 NOTE — Progress Notes (Signed)
Patient d/c to home.  IV removed, prescriptions given and instructions reviewed.

## 2013-08-10 NOTE — Discharge Instructions (Signed)
..  Keep bandage clean and dry.  Call for any problems.  No smoking.  Criteria for driving a car: you should be off your pain medicine for 7-8 hours, able to drive one handed(confident), thinking clearly and feeling able in your judgement to drive. Continue elevation as it will decrease swelling.  If instructed by MD move your fingers within the confines of the bandage/splint.  Use ice if instructed by your MD. Call immediately for any sudden loss of feeling in your hand/arm or change in functional abilities of the extremity. Marland Kitchen.We recommend that you to take vitamin C 1000 mg a day to promote healing we also recommend that if you require her pain medicine that he take a stool softener to prevent constipation as most pain medicines will have constipation side effects. We recommend either Peri-Colace or Senokot and recommend that you also consider adding MiraLAX to prevent the constipation affects from pain medicine if you are required to use them. These medicines are over the counter and maybe purchased at a local pharmacy.

## 2013-08-10 NOTE — Care Management Note (Signed)
CARE MANAGEMENT NOTE 08/10/2013  Patient:  [name], [name]   Account Number:  [id number]  Date Initiated:  08/08/2013  Documentation initiated by:  Ricki Miller  Subjective/Objective Assessment:   53 yr old female admitted with right hand compartment syndrome, s/p I & D with skin grafting.     Action/Plan:   Patient to be assessed by CIR. Social worker notified for SNF backup.CM will continue to monitor.   Anticipated DC Date:  08/10/2013   Anticipated DC Plan:  [place]  CM consult      [facility] Choice  HOME HEALTH   Choice offered to / List presented to:          [facility] arranged  HH-1 RN  [place].   Status of service:  Completed, signed off Medicare Important Message given?   (If response is "NO", the following Medicare IM given date fields will be blank) Date Medicare IM given:   Date Additional Medicare IM given:    Discharge Disposition:  [place]  Per UR Regulation:    If discussed at Long Length of Stay Meetings, dates discussed:    Comments:  08/10/13 Ricki Miller, RN BSN Case Manager [phone] Case Manager spoke wth patent. She is not a candidate for CIR, she isnt interested in going to SNF, wants to go home. CM arranged for Home health RN for wound care and an Aide to assist with ADL's. Patient states she will get one of her friends to assist with casre as needed. Referral called to [facility].

## 2013-08-10 NOTE — Discharge Summary (Signed)
Physician Discharge Summary  Patient ID: ANALUISA TUDOR MRN: [id number] DOB/AGE: [birth year] 53 y.o.  Admit date: 08/03/2013 Discharge date: 08/10/2013  Admission Diagnoses: Compartment syndrome and the cellulitis of the right forearm Patient Active Problem List   Diagnosis Date Noted  . Compartment syndrome, traumatic, upper extremity 08/04/2013  . Cellulitis of hand, right 08/04/2013  . Chronic pain 01/06/2013  . Depression 01/06/2013  . Hypertension 01/06/2013  . Chest pain 07/18/2012  . Dyspnea 07/18/2012  . VITAMIN B12 DEFICIENCY 08/30/2007  . HOARSENESS 08/30/2007  . PERIODONTITIS, CHRONIC NOS 12/19/2006  . TOBACCO ABUSE 11/22/2006  . OSTEOARTHRITIS 11/22/2006  . Gout, Unspecified 10/31/2006  . DISORDER, BIPOLAR NOS 10/31/2006  . ANXIETY 10/31/2006  . BENZODIAZEPINE ADDICTION 10/31/2006  . ALLERGIC RHINITIS 10/31/2006  . PEPTIC ULCER DISEASE 10/31/2006  . CONSTIPATION NOS 10/31/2006  . ARTHRITIS 10/31/2006  . NECK PAIN 10/31/2006  . LOW BACK PAIN 10/31/2006  . LEG PAIN, CHRONIC 10/31/2006  . CERVICAL CANCER, HX OF 10/31/2006  . RECTAL BLEEDING, HX OF 10/31/2006  . MIGRAINES, HX OF 10/31/2006    Discharge Diagnoses:  Principal Problem:   Compartment syndrome, traumatic, upper extremity Active Problems:   DISORDER, BIPOLAR NOS   BENZODIAZEPINE ADDICTION   TOBACCO ABUSE   PEPTIC ULCER DISEASE   OSTEOARTHRITIS   Chronic pain   Hypertension   Cellulitis of hand, right   Discharged Condition: guarded in regards to the upper extremity, overall improved.  Hospital Course: The patient is a 53 year old female who presented to the emergency room setting after being referred from [facility] evaluation of her right upper extremity. The patient had sustained a fall landing on the right upper extremity 3-4 days prior to her emergency room visit. She complained of increasing pain swelling and redness to the upper extremity. She states she was unable to use the hand  meaningful fashion and noted a significant amount of pain evaluation showed findings consistent with cellulitis and evolving compartment syndrome. She was referred to [facility] for hand surgery evaluation. Upon evaluation she was noted to have a significant cellulitic process as well as compartment syndrome of the right forearm and hand. Patient was taken to the operative suite urgently and underwent I and D's as well as fasciotomies, please see operative report for full details. The operative laboratory data was reviewed as well as radiographs which showed no acute bony abnormalities. As noted in her history she is a very poor historian had difficulties initially remembering the exact injury she sustained . The patient has a lengthy medical history as noted above Over the course of her hospital admit , which was one week in duration , patient underwent to additional trips to the operative suite for a repeat irrigation and debridement and finally repeat I&D with closure utilizing a full-thickness skin graft obtained from her right thigh. Please see operative report for full details. The patient continually improved in regards to her cellulitis and the time of her wound closures the cellulitis is completely resolved. Of course she still sustained significant deficits given the compartment syndrome. She has weakness with extension gross numbness of the hand and difficulties with full flexion endeavors was noted to improve her flexion throughout her hospital course as discussed with her at multiple visits have concerns for a significant deficit is noted given the delay in her treatment as she did not present to the emergency room for 3-4 days after her injury. Patient was managed with IV vancomycin and Zosyn given her cellulitic process. There were no  organisms on her interoperative cultures to be found. She underwent extensive therapy while inpatient and daily wound care.   As the patient stabilized plans for  discharge were made. She lives alone and given the wounds to her hip and to the upper extremity we have recommended home health care for wound care and a home health care aid for assistance with showers, dressing and activities of daily living. Advanced home care was consulted and approved. The decision was made to discharge her home with home health care. All discharge instructions were discussed with her at length. I should note extensive rounds were made on the patient typically greater than 40 minutes to an hour with each visit.     Treatments: Status post multiple I&D's with resultant wound closure of the forearm utilizing a full-thickness skin graft from the right thigh  Discharge Exam: Blood pressure 141/61, pulse 63, temperature 98.1 F (36.7 C), temperature source Oral, resp. rate 16, height 5\' 4"  (1.626 m), weight 81.647 kg (180 lb), last menstrual period 09/16/2000, SpO2 97.00%.  the patient is awake, pleasant she is sitting in a chair  Evaluation of the right upper extremity shows that her range of motion has improved flexion and extension but there is still significant deficits in full extension present. She still has gross numbness of the digits present, refill is intact, there is no signs of infection or recurrent cellulitis at this juncture  Evaluation of the right thigh shows that the wound is intact appropriate. Head atraumatic Chest shows equal expansions respirations are nonlabored Abdomen is nontender  Hair dryerTreatments have been performed while inpatient the wound is healing without difficulties.  Disposition: 01-Home or Self Care  Discharge Orders   Future Orders Complete By Expires   Ambulatory referral to [place]  As directed    Call MD / Call 911  As directed    Constipation Prevention  As directed    Diet - low sodium heart healthy  As directed    Discharge instructions  As directed    Increase activity slowly as tolerated  As directed         Medication List         ALPRAZolam 1 MG tablet  Commonly known as:  XANAX  Take 1 mg by mouth 2 (two) times daily as needed for anxiety.     ALPRAZolam 1 MG tablet  Commonly known as:  XANAX  Take 1 tablet (1 mg total) by mouth 2 (two) times daily as needed for anxiety.     cephALEXin 500 MG capsule  Commonly known as:  KEFLEX  Take 1 capsule (500 mg total) by mouth 4 (four) times daily.     cyclobenzaprine 10 MG tablet  Commonly known as:  FLEXERIL  Take 10 mg by mouth 3 (three) times daily as needed. For muscle spasms     cyclobenzaprine 10 MG tablet  Commonly known as:  FLEXERIL  Take 1 tablet (10 mg total) by mouth 3 (three) times daily as needed for muscle spasms.     CYMBALTA 60 MG capsule  Generic drug:  DULoxetine  Take 60 mg by mouth at bedtime.     nitroGLYCERIN 0.3 MG SL tablet  Commonly known as:  NITROSTAT  Place 0.3 mg under the tongue every 5 (five) minutes as needed for chest pain.     omeprazole 40 MG capsule  Commonly known as:  PRILOSEC  Take 40 mg by mouth daily.     oxycodone 30 MG immediate  release tablet  Commonly known as:  ROXICODONE  Take 30 mg by mouth 4 (four) times daily.     oxycodone 30 MG immediate release tablet  Commonly known as:  ROXICODONE  Take 1 tablet (30 mg total) by mouth every 6 (six) hours as needed for pain.     triamterene-hydrochlorothiazide 37.5-25 MG per tablet  Commonly known as:  MAXZIDE-25  Take 1 tablet by mouth daily as needed. Take when blood pressure is high           Follow-up Information   Follow up with [place]. (Someone from [place] care will contact you concerning start date and time for home health nurse and aide.)    Contact information:   [address] [place] [address] [phone]       Follow up with Paulene Floor, MD. (follow up next friday(in 1 week) at 1:30pm please call for questions or concerns)    Specialty:  Orthopedic Surgery   Contact  information:   [address] [place] [address] [place] [address] [phone]       Signed: Ivan Croft 08/10/2013, 2:10 PM

## 2013-08-15 ENCOUNTER — Other Ambulatory Visit (HOSPITAL_COMMUNITY): Payer: Self-pay | Admitting: Family Medicine

## 2013-08-15 DIAGNOSIS — N6310 Unspecified lump in the right breast, unspecified quadrant: Secondary | ICD-10-CM

## 2013-08-29 ENCOUNTER — Encounter (HOSPITAL_COMMUNITY): Payer: Medicaid Other

## 2013-08-30 ENCOUNTER — Inpatient Hospital Stay (HOSPITAL_COMMUNITY): Admission: RE | Admit: 2013-08-30 | Payer: Medicaid Other | Source: Ambulatory Visit

## 2013-09-03 ENCOUNTER — Ambulatory Visit (HOSPITAL_COMMUNITY)
Admission: RE | Admit: 2013-09-03 | Discharge: 2013-09-03 | Disposition: A | Discharge status: Home / Self Care | Payer: Medicaid Other | Source: Ambulatory Visit | Attending: Orthopedic Surgery | Admitting: Orthopedic Surgery

## 2013-09-03 DIAGNOSIS — M24539 Contracture, unspecified wrist: Secondary | ICD-10-CM | POA: Insufficient documentation

## 2013-09-03 DIAGNOSIS — T79A19A Traumatic compartment syndrome of unspecified upper extremity, initial encounter: Secondary | ICD-10-CM

## 2013-09-03 DIAGNOSIS — L259 Unspecified contact dermatitis, unspecified cause: Secondary | ICD-10-CM | POA: Diagnosis not present

## 2013-09-03 DIAGNOSIS — R609 Edema, unspecified: Secondary | ICD-10-CM | POA: Diagnosis not present

## 2013-09-03 DIAGNOSIS — G5600 Carpal tunnel syndrome, unspecified upper limb: Secondary | ICD-10-CM | POA: Diagnosis not present

## 2013-09-03 DIAGNOSIS — IMO0002 Reserved for concepts with insufficient information to code with codable children: Secondary | ICD-10-CM | POA: Insufficient documentation

## 2013-09-03 DIAGNOSIS — L03119 Cellulitis of unspecified part of limb: Secondary | ICD-10-CM | POA: Diagnosis not present

## 2013-09-03 DIAGNOSIS — W19XXXA Unspecified fall, initial encounter: Secondary | ICD-10-CM | POA: Insufficient documentation

## 2013-09-03 DIAGNOSIS — M2420 Disorder of ligament, unspecified site: Secondary | ICD-10-CM

## 2013-09-03 DIAGNOSIS — M25549 Pain in joints of unspecified hand: Secondary | ICD-10-CM

## 2013-09-03 DIAGNOSIS — L02519 Cutaneous abscess of unspecified hand: Secondary | ICD-10-CM | POA: Diagnosis not present

## 2013-09-03 DIAGNOSIS — IMO0001 Reserved for inherently not codable concepts without codable children: Secondary | ICD-10-CM | POA: Diagnosis not present

## 2013-09-03 DIAGNOSIS — M24549 Contracture, unspecified hand: Secondary | ICD-10-CM | POA: Insufficient documentation

## 2013-09-03 DIAGNOSIS — M629 Disorder of muscle, unspecified: Secondary | ICD-10-CM

## 2013-09-03 DIAGNOSIS — M25539 Pain in unspecified wrist: Secondary | ICD-10-CM | POA: Insufficient documentation

## 2013-09-03 NOTE — Evaluation (Signed)
Occupational Therapy Evaluation  Patient Details  Name: [name] MRN: [id number] Date of Birth: 1961-04-20  Today's Date: 09/03/2013 Time: 1020-1110 OT Time Calculation (min): 50 min OT Eval ' Visit#: 1 of 12  Re-eval: 10/01/13  Assessment Diagnosis: S/P Right Forearm Compartmental Syndrome Release with Full Thickness Skin Graft and Fasciaotomies Surgical Date: 08/08/13 (08/03/13, 08/04/13) Next MD Visit: this week Prior Therapy: acute care  Authorization: medicaid requesting 12 therapy visits   Authorization Time Period: thru august 3  Authorization Visit#: 0 of     Past Medical History:  Past Medical History  Diagnosis Date  . Allergic rhinitis   . Anxiety and depression   . Osteoarthritis   . PUD (peptic ulcer disease)   . Gout   . DJD (degenerative joint disease) of lumbar spine   . Chronic back pain   . GI bleed   . Chronic pain syndrome   . Cervical cancer    Past Surgical History:  Past Surgical History  Procedure Laterality Date  . Total abdominal hysterectomy    . Cervival cone    . Tubal ligation    . Appendectomy    . Throat surgery    . I&d extremity Right 08/04/2013    Procedure: IRRIGATION AND DEBRIDEMENT RIGHT HAND AND FOREARM;  Surgeon: Roseanne Kaufman, MD;  Location: [place];  Service: Orthopedics;  Laterality: Right;  . Fasciotomy Right 08/04/2013    Procedure: FASCIOTOMY;  Surgeon: Roseanne Kaufman, MD;  Location: [place];  Service: Orthopedics;  Laterality: Right;  . Application of wound vac Right 08/04/2013    Procedure: APPLICATION OF WOUND VAC;  Surgeon: Roseanne Kaufman, MD;  Location: [place];  Service: Orthopedics;  Laterality: Right;  . I&d extremity Right 08/05/2013    Procedure: IRRIGATION AND DEBRIDEMENT RIGHT HAND AND FOREARM with SECONDARY CLOSURE ;  Surgeon: Roseanne Kaufman, MD;  Location: [place];  Service: Orthopedics;  Laterality: Right;  . I&d extremity Right 08/07/2013    Procedure: IRRIGATION AND DEBRIDEMENT RIGHT ARM, FOREARM, HAND AND  SKIN GRAFTING AS NECESSARY TO AFECTED AREAS (FOREARM AND HAND );  Surgeon: Roseanne Kaufman, MD;  Location: [place];  Service: Orthopedics;  Laterality: Right;  graft from right thigh to arm  . Skin full thickness graft Right 08/07/2013    Procedure: AND SKIN GRAFTING AS NECESSARY TO AFECTED AREAS (FOREARM AND HAND );  Surgeon: Roseanne Kaufman, MD;  Location: [place];  Service: Orthopedics;  Laterality: Right;  skin graft from right thigh    Subjective Symptoms/Limitations Symptoms: S:  I can't use my right hand at all.  It throbs all of the time and swells.   Pertinent History: The patient is a 53 year old female who presented to the emergency room setting after being referred from [facility] evaluation of her right upper extremity. The patient had sustained a fall landing on the right upper extremity 3-4 days prior to her emergency room visit. She complained of increasing pain swelling and redness to the upper extremity. She states she was unable to use the hand meaningful fashion and noted a significant amount of pain evaluation showed findings consistent with cellulitis and evolving compartment syndrome. She was referred to [facility] for hand surgery evaluation. Upon evaluation she was noted to have a significant cellulitic process as well as compartment syndrome of the right forearm and hand. Patient was taken to the operative suite urgently and underwent I and D's as well as fasciotomies, please see operative report for full details. The operative laboratory data was  reviewed as well as radiographs which showed no acute bony abnormalities. As noted in her history she is a very poor historian had difficulties initially remembering the exact injury she sustained . Over the course of her hospital admit , which was one week in duration , patient underwent to additional trips to the operative suite for a repeat irrigation and debridement and finally repeat I&D with closure utilizing a full-thickness skin graft  obtained from her right thigh. Please see operative report for full details. The patient continually improved in regards to her cellulitis and the time of her wound closures the cellulitis is completely resolved. Of course she still sustained significant deficits given the compartment syndrome. She has weakness with extension gross numbness of the hand and difficulties with full flexion endeavors was noted to improve her flexion throughout her hospital course as discussed with her at multiple visits have concerns for a significant deficit is noted given the delay in her treatment as she did not present to the emergency room for 3-4 days after her injury. Patient was managed with IV vancomycin and Zosyn given her cellulitic process. There were no organisms on her interoperative cultures to be found. Surgical procedures included and not limited to: 1. Irrigation debridement of skin, subcutaneous tissue, muscle, right  forearm.  2. Incision and drainage, skin, subcutaneous tissue, and muscle thenar  space, hypothenar space and carpal canal region.  3. Delayed closure carpal tunnel release.  4. Delayed closure hypothenar fasciotomy.  5. Palmaris longus tenotomy.  6. Flexor carpi radialis tenolysis tenosynovectomy.  7. Manipulation under anesthesia. Index, middle, and ring as well as  small finger, PIP, DIP, and MCP joint. She underwent extensive therapy while inpatient and daily wound care.  Patient has been receiving home health services for nursing and ADLs, which ended last week.  She has been referred to occupational therapy for wound care and A/AA/PROM to her digits.   Patient Stated Goals: I want to use my hand again. Pain Assessment Currently in Pain?: Yes Pain Score: 8  Pain Location: Arm (hand) Pain Orientation: Right (volar) Pain Type: Acute pain Effect of Pain on Daily Activities: unable to use her right hand with daily activities.  Comments burning and throbbing in her digits.    Precautions/Restrictions  Precautions Precautions: None Restrictions Weight Bearing Restrictions: No  Balance Screening Balance Screen Has the patient fallen in the past 6 months: Yes How many times?: 1 Has the patient had a decrease in activity level because of a fear of falling? : No Is the patient reluctant to leave their home because of a fear of falling? : No  Prior Functioning  Home Living Additional Comments: lives alone Prior Function Driving: Yes Vocation: On disability Comments: likes to stay active  Assessment ADL/Vision/Perception ADL ADL Comments: unable to use right hand and arm with functional activities. Is receiving extensive assistance with her BADLs and IADLs. Dominant Hand: Right Vision - History Baseline Vision: No visual deficits  Cognition/Observation Cognition Orientation Level: Oriented X4 Observation/Other Assessments Observations: wound dimensions:  volar forearm 12 cm x 4 cm x 1 cm deep, thenar eminence 3 cm x 1 cm, 2 healing surgical incisions: carpal tunnel release 4 cm and ulnar border of hand 2 cm   Sensation/Coordination/Edema Sensation Light Touch: Impaired by gross assessment Coordination Gross Motor Movements are Fluid and Coordinated: No Fine Motor Movements are Fluid and Coordinated: No 9 Hole Peg Test: to be assessed Edema Edema: MCPJs: right 20.5 cm left: 18.0 cm PIPJs:  right:  18.0 cm left 16.0 cn  Additional Assessments RUE AROM (degrees) RUE Overall AROM Comments: assessed in seated Right Forearm Pronation: 50 Degrees Right Forearm Supination: 90 Degrees Right Wrist Extension: 0 Degrees Right Wrist Flexion: 35 Degrees Right Composite Finger Extension:  (PIPJ:  t 0 i +40 l +30 r +22) Right Composite Finger Flexion:  (t 25/20 i 32/50/40 l 40/50/40 r 32/50/40 s 20/50/40) Right Thumb Opposition: Digit 2 RUE PROM (degrees) RUE Overall PROM Comments: patient would not allow OT to passively range hand this date RUE  Strength RUE Overall Strength Comments: strength testing deferred  Palpation Palpation: sensitive to touch entire upper arm wrist and hand      Exercise/Treatments  Manual Therapy Manual Therapy: Other (comment) Other Manual Therapy: wound care:  OTR/L cleansed all wound surfaces with keriklenze.  Discussed importance of debriding necrotic tissue and patient declined stating she was not up to it.  After cleaning wound, I applied hydragel to entire wound bed, as wound bed was very dry.  I applied vaseline to borders of all wounds and to areas of arm that appeared to have contact dermatitis.  I placed 2 4x4 pads that were moistened with saline over both skin graft wounds.  I wrapped entire hand and forearm with guaze and secured with webbed sleeve  Occupational Therapy Assessment and Plan OT Assessment and Plan Clinical Impression Statement: A:  Patient presents s/p release of compartmental syndrome of right foreram and hand with several surgeries and fasciotomies.  Pt will benefit from skilled therapeutic intervention in order to improve on the following deficits: Decreased coordination;Decreased skin integrity;Decreased range of motion;Increased edema;Increased fascial restricitons;Pain;Decreased strength Rehab Potential: Fair Clinical Impairments Affecting Rehab Potential: transportation issues, past medical history, severity of deficits OT Frequency: Min 1X/week OT Duration: 12 weeks OT Treatment/Interventions: Self-care/ADL training;Therapeutic exercise;Manual therapy;Therapeutic activities;Modalities;Splinting;Patient/family education;Other (comment) (wound care) OT Plan: P:  Skilled OT intervention 1 x a week for range of hand and increasing functional use of right hand with daily activities.  Wound care to be completed 1-2 x week until wound healed.  Progress as tolerated   Goals Short Term Goals Time to Complete Short Term Goals: 6 weeks Short Term Goal 1: Patient will be educated on  HEP and wound care tips. Short Term Goal 2: [place] and strength will be assessed. Short Term Goal 3: Patient will improve AROM of right hand and wrist by 20 degrees for increased ability to use right hand with functional activities.  Short Term Goal 4: Patient will decrease edema by 1.0 cm. Short Term Goal 5: Patient will decrease wound size by 50%. Long Term Goals Time to Complete Long Term Goals: 12 weeks Long Term Goal 1: Patient will return to prior level of independence with all B/IADLs and leisure activities.  Long Term Goal 2: Patient will have WFL AROM in her right hand and wrist for increased independence with all daily activities.  Long Term Goal 3: Patient will have 50% strength in right hand and wrist vs left for greater ability to maintain grasp on household items. Long Term Goal 4: Patient will have 50% [place] in right hand vs left for increased ability to tie shoes and button buttons.  Long Term Goal 5: Patient will have 3/10 pain in her right arm and hand when completing daily tasks. Additional Long Term Goals?: Yes Long Term Goal 6: Patient will have min-mod fascial restrictions in her right arm. Long Term Goal 7: Patient will decrease edema by 2.0 cm. Long Term Goal  8: Patients wound will be fully healed with a 100% granulated wound bed.   Problem List Patient Active Problem List   Diagnosis Date Noted  . Carpal tunnel syndrome 09/03/2013  . Late effect of tendon injury 09/03/2013  . Pain in joint, hand 09/03/2013  . Pain in joint, forearm 09/03/2013  . Unspecified disorder of muscle, ligament, and fascia 09/03/2013  . Compartment syndrome, traumatic, upper extremity 08/04/2013  . Cellulitis of hand, right 08/04/2013  . Chronic pain 01/06/2013  . Depression 01/06/2013  . Hypertension 01/06/2013  . Chest pain 07/18/2012  . Dyspnea 07/18/2012  . VITAMIN B12 DEFICIENCY 08/30/2007  . HOARSENESS 08/30/2007  . PERIODONTITIS, CHRONIC NOS 12/19/2006  . TOBACCO ABUSE 11/22/2006   . OSTEOARTHRITIS 11/22/2006  . Gout, Unspecified 10/31/2006  . DISORDER, BIPOLAR NOS 10/31/2006  . ANXIETY 10/31/2006  . BENZODIAZEPINE ADDICTION 10/31/2006  . ALLERGIC RHINITIS 10/31/2006  . PEPTIC ULCER DISEASE 10/31/2006  . CONSTIPATION NOS 10/31/2006  . ARTHRITIS 10/31/2006  . NECK PAIN 10/31/2006  . LOW BACK PAIN 10/31/2006  . LEG PAIN, CHRONIC 10/31/2006  . CERVICAL CANCER, HX OF 10/31/2006  . RECTAL BLEEDING, HX OF 10/31/2006  . MIGRAINES, HX OF 10/31/2006    End of Session Activity Tolerance: Patient limited by pain;Patient tolerated treatment well General Behavior During Therapy: WFL for tasks assessed/performed OT Plan of Care OT Home Exercise Plan: wound kept clean and dressing in tact, educated patient on fine motor exercises for hand.  Consulted and Agree with Plan of Care: Patient  [place], OTR/L [phone]  09/03/2013, 9:50 PM  Physician Documentation Your signature is required to indicate approval of the treatment plan as stated above.  Please sign and either send electronically or make a copy of this report for your files and return this physician signed original.  Please mark one 1.__approve of plan  2. ___approve of plan with the following conditions.   ______________________________                                                          _____________________ Physician Signature                                                                                                             Date

## 2013-09-04 ENCOUNTER — Ambulatory Visit (HOSPITAL_COMMUNITY)
Admission: RE | Admit: 2013-09-04 | Discharge: 2013-09-04 | Disposition: A | Discharge status: Home / Self Care | Payer: Medicaid Other | Source: Ambulatory Visit | Attending: Family Medicine | Admitting: Family Medicine

## 2013-09-04 ENCOUNTER — Other Ambulatory Visit (HOSPITAL_COMMUNITY): Payer: Self-pay | Admitting: Family Medicine

## 2013-09-04 DIAGNOSIS — N6310 Unspecified lump in the right breast, unspecified quadrant: Secondary | ICD-10-CM

## 2013-09-04 DIAGNOSIS — N63 Unspecified lump in unspecified breast: Secondary | ICD-10-CM | POA: Insufficient documentation

## 2013-09-05 ENCOUNTER — Other Ambulatory Visit (HOSPITAL_COMMUNITY): Payer: Self-pay | Admitting: Family Medicine

## 2013-09-05 DIAGNOSIS — R609 Edema, unspecified: Secondary | ICD-10-CM

## 2013-09-05 DIAGNOSIS — Z09 Encounter for follow-up examination after completed treatment for conditions other than malignant neoplasm: Secondary | ICD-10-CM

## 2013-09-10 ENCOUNTER — Ambulatory Visit (HOSPITAL_COMMUNITY): Payer: Medicaid Other | Admitting: Specialist

## 2013-10-08 ENCOUNTER — Ambulatory Visit (HOSPITAL_COMMUNITY)
Admission: RE | Admit: 2013-10-08 | Discharge: 2013-10-08 | Disposition: A | Discharge status: Home / Self Care | Payer: Medicaid Other | Source: Ambulatory Visit | Attending: Orthopedic Surgery | Admitting: Orthopedic Surgery

## 2013-10-08 DIAGNOSIS — G5600 Carpal tunnel syndrome, unspecified upper limb: Secondary | ICD-10-CM

## 2013-10-08 DIAGNOSIS — M629 Disorder of muscle, unspecified: Secondary | ICD-10-CM

## 2013-10-08 DIAGNOSIS — F411 Generalized anxiety disorder: Secondary | ICD-10-CM | POA: Insufficient documentation

## 2013-10-08 DIAGNOSIS — R279 Unspecified lack of coordination: Secondary | ICD-10-CM | POA: Insufficient documentation

## 2013-10-08 DIAGNOSIS — M6281 Muscle weakness (generalized): Secondary | ICD-10-CM | POA: Insufficient documentation

## 2013-10-08 DIAGNOSIS — IMO0002 Reserved for concepts with insufficient information to code with codable children: Secondary | ICD-10-CM

## 2013-10-08 DIAGNOSIS — M7989 Other specified soft tissue disorders: Secondary | ICD-10-CM | POA: Insufficient documentation

## 2013-10-08 DIAGNOSIS — IMO0001 Reserved for inherently not codable concepts without codable children: Secondary | ICD-10-CM | POA: Insufficient documentation

## 2013-10-08 DIAGNOSIS — M25529 Pain in unspecified elbow: Secondary | ICD-10-CM | POA: Insufficient documentation

## 2013-10-08 DIAGNOSIS — M25549 Pain in joints of unspecified hand: Secondary | ICD-10-CM

## 2013-10-08 DIAGNOSIS — M25539 Pain in unspecified wrist: Secondary | ICD-10-CM

## 2013-10-08 DIAGNOSIS — M2420 Disorder of ligament, unspecified site: Secondary | ICD-10-CM | POA: Insufficient documentation

## 2013-10-08 DIAGNOSIS — M25639 Stiffness of unspecified wrist, not elsewhere classified: Secondary | ICD-10-CM | POA: Insufficient documentation

## 2013-10-08 NOTE — Evaluation (Signed)
Occupational Therapy Re-Evaluation  Patient Details  Name: [name] MRN: [id number] Date of Birth: [birth year]  Today's Date: 10/08/2013 Time: 5027-7412 OT Time Calculation (min): 45 min Re-evaluation 24' Visit#: 2 of 12  Re-eval: 11/05/13  Assessment Diagnosis: S/P Right Forearm Compartmental Syndrome Release with Full Thickness Skin Graft and Fasciaotomies Surgical Date: 08/08/13  Authorization: medicaid requesting 12 therapy visits   Authorization Time Period: thru august 3  Authorization Visit#: 0 of     Past Medical History:  Past Medical History  Diagnosis Date  . Allergic rhinitis   . Anxiety and depression   . Osteoarthritis   . PUD (peptic ulcer disease)   . Gout   . DJD (degenerative joint disease) of lumbar spine   . Chronic back pain   . GI bleed   . Chronic pain syndrome   . Cervical cancer    Past Surgical History:  Past Surgical History  Procedure Laterality Date  . Total abdominal hysterectomy    . Cervival cone    . Tubal ligation    . Appendectomy    . Throat surgery    . I&d extremity Right 08/04/2013    Procedure: IRRIGATION AND DEBRIDEMENT RIGHT HAND AND FOREARM;  Surgeon: Roseanne Kaufman, MD;  Location: [place];  Service: Orthopedics;  Laterality: Right;  . Fasciotomy Right 08/04/2013    Procedure: FASCIOTOMY;  Surgeon: Roseanne Kaufman, MD;  Location: [place];  Service: Orthopedics;  Laterality: Right;  . Application of wound vac Right 08/04/2013    Procedure: APPLICATION OF WOUND VAC;  Surgeon: Roseanne Kaufman, MD;  Location: [place];  Service: Orthopedics;  Laterality: Right;  . I&d extremity Right 08/05/2013    Procedure: IRRIGATION AND DEBRIDEMENT RIGHT HAND AND FOREARM with SECONDARY CLOSURE ;  Surgeon: Roseanne Kaufman, MD;  Location: [place];  Service: Orthopedics;  Laterality: Right;  . I&d extremity Right 08/07/2013    Procedure: IRRIGATION AND DEBRIDEMENT RIGHT ARM, FOREARM, HAND AND SKIN GRAFTING AS NECESSARY TO AFECTED AREAS (FOREARM AND HAND );   Surgeon: Roseanne Kaufman, MD;  Location: [place];  Service: Orthopedics;  Laterality: Right;  graft from right thigh to arm  . Skin full thickness graft Right 08/07/2013    Procedure: AND SKIN GRAFTING AS NECESSARY TO AFECTED AREAS (FOREARM AND HAND );  Surgeon: Roseanne Kaufman, MD;  Location: [place];  Service: Orthopedics;  Laterality: Right;  skin graft from right thigh    Subjective Symptoms/Limitations Symptoms: S:  I did my own wound care.  I cant move my hand much now. Pertinent History: Patient returns to therapy with healed wound this date with new orders from MD for AROM of entire RUE.  Discussed Medicaid guidelines with patient and their limitations on visits, will resubmit for Medicaid and see if any visits are reimbursible.  If not, patient states that she would like to complete therapy on her own as a HEP. Pain Assessment Currently in Pain?: Yes Pain Score: 6  Pain Location: Arm Pain Orientation: Right Pain Type: Acute pain;Neuropathic pain  Assessment ADL/Vision/Perception ADL ADL Comments: unable to use right hand other than as a gross assist with functional activities Dominant Hand: Right  Cognition/Observation Cognition Orientation Level: Oriented X4 Observation/Other Assessments Observations: healed incision now 5" in length by 1" in width on volar forearm.    Sensation/Coordination/Edema Sensation Light Touch: Impaired by gross assessment Coordination Gross Motor Movements are Fluid and Coordinated: No Fine Motor Movements are Fluid and Coordinated: No 9 Hole Peg Test: to be assessed  Additional  Assessments RUE AROM (degrees) RUE Overall AROM Comments: assessed in seated Right Shoulder Flexion: 140 Degrees Right Shoulder ABduction: 140 Degrees Right Shoulder Internal Rotation: 80 Degrees Right Shoulder External Rotation: 38 Degrees Right Elbow Flexion: 145 Right Elbow Extension:  (lacks 10 degrees from neutral) Right Forearm Pronation: 80 Degrees Right  Forearm Supination: 90 Degrees Right Wrist Extension: 10 Degrees Right Wrist Flexion: 50 Degrees Right Composite Finger Extension:  (WFL except for I 0/+48/+42 long 0/0/+30) Right Composite Finger Flexion:  ( 34/22, i 44/60/54, l 42/60/46, r 44/50/30, s 22/40/30) Right Thumb Opposition: Digit 2 RUE PROM (degrees) RUE Overall PROM Comments: not assessed this date RUE Strength RUE Overall Strength Comments: strength testing deferred  Palpation Palpation: max fascial and scar restrictions throughout hand, wrist, forearm, elbow, and shoulder      Occupational Therapy Assessment and Plan OT Assessment and Plan Clinical Impression Statement: A:  Patient referred back to therapy for AROM of RUE.  Wound fully healed.  Pt will benefit from skilled therapeutic intervention in order to improve on the following deficits: Decreased coordination;Decreased skin integrity;Decreased range of motion;Increased edema;Increased fascial restricitons;Pain;Decreased strength Rehab Potential: Fair Clinical Impairments Affecting Rehab Potential: transportation issues, past medical history, severity of deficits OT Frequency: Min 2X/week OT Duration: 6 weeks OT Treatment/Interventions: Self-care/ADL training;Therapeutic exercise;Manual therapy;Therapeutic activities;Modalities;Splinting;Patient/family education;Other (comment) OT Plan: P:  Await Medicaid approval, improve AROM of RUE to [facility] for ADL completion and for use of right hand as an active assist with daily activities.    Goals Short Term Goals Short Term Goal 1: Patient will be educated on a HEP. Short Term Goal 2: [place] and strength will be assessed. Short Term Goal 3: Patient will improve AROM of right hand and wrist by 20 degrees for increased ability to use right hand with functional activities.  Short Term Goal 4: Patient will decrease edema by 1.0 cm. Short Term Goal 5 Progress: Met Long Term Goals Long Term Goal 1: Patient will return to prior  level of independence with all B/IADLs and leisure activities.  Long Term Goal 2: Patient will have WFL AROM in her right hand and wrist for increased independence with all daily activities.  Long Term Goal 3: Patient will have 50% strength in right hand and wrist vs left for greater ability to maintain grasp on household items. Long Term Goal 4: Patient will have 50% [place] in right hand vs left for increased ability to tie shoes and button buttons.  Long Term Goal 5: Patient will have 3/10 pain in her right arm and hand when completing daily tasks. Long Term Goal 6: Patient will have min-mod fascial restrictions in her right arm. Long Term Goal 7: Patient will decrease edema by 2.0 cm.  Problem List Patient Active Problem List   Diagnosis Date Noted  . Carpal tunnel syndrome 09/03/2013  . Late effect of tendon injury 09/03/2013  . Pain in joint, hand 09/03/2013  . Pain in joint, forearm 09/03/2013  . Unspecified disorder of muscle, ligament, and fascia 09/03/2013  . Compartment syndrome, traumatic, upper extremity 08/04/2013  . Cellulitis of hand, right 08/04/2013  . Chronic pain 01/06/2013  . Depression 01/06/2013  . Hypertension 01/06/2013  . Chest pain 07/18/2012  . Dyspnea 07/18/2012  . VITAMIN B12 DEFICIENCY 08/30/2007  . HOARSENESS 08/30/2007  . PERIODONTITIS, CHRONIC NOS 12/19/2006  . TOBACCO ABUSE 11/22/2006  . OSTEOARTHRITIS 11/22/2006  . Gout, Unspecified 10/31/2006  . DISORDER, BIPOLAR NOS 10/31/2006  . ANXIETY 10/31/2006  . BENZODIAZEPINE ADDICTION 10/31/2006  .  ALLERGIC RHINITIS 10/31/2006  . PEPTIC ULCER DISEASE 10/31/2006  . CONSTIPATION NOS 10/31/2006  . ARTHRITIS 10/31/2006  . NECK PAIN 10/31/2006  . LOW BACK PAIN 10/31/2006  . LEG PAIN, CHRONIC 10/31/2006  . CERVICAL CANCER, HX OF 10/31/2006  . RECTAL BLEEDING, HX OF 10/31/2006  . MIGRAINES, HX OF 10/31/2006    End of Session Activity Tolerance: Patient tolerated treatment well General Behavior During  Therapy: [facility] for tasks assessed/performed OT Plan of Care OT Home Exercise Plan: shoulder, elbow, wrist, hand AROM, active hand exercises OT Patient Instructions: handouts given Consulted and Agree with Plan of Care: Patient  [place], OTR/L [phone]  10/08/2013, 3:10 PM  Physician Documentation Your signature is required to indicate approval of the treatment plan as stated above.  Please sign and either send electronically or make a copy of this report for your files and return this physician signed original.  Please mark one 1.__approve of plan  2. ___approve of plan with the following conditions.   ______________________________                                                          _____________________ Physician Signature                                                                                                             Date

## 2013-10-17 ENCOUNTER — Inpatient Hospital Stay (HOSPITAL_COMMUNITY): Admission: RE | Admit: 2013-10-17 | Payer: Medicaid Other | Source: Ambulatory Visit | Admitting: Specialist

## 2013-10-24 ENCOUNTER — Inpatient Hospital Stay (HOSPITAL_COMMUNITY): Admission: RE | Admit: 2013-10-24 | Payer: Medicaid Other | Source: Ambulatory Visit | Admitting: Specialist

## 2013-10-29 ENCOUNTER — Ambulatory Visit (HOSPITAL_COMMUNITY)
Admission: RE | Admit: 2013-10-29 | Discharge: 2013-10-29 | Disposition: A | Discharge status: Home / Self Care | Payer: Medicaid Other | Source: Ambulatory Visit | Attending: Orthopedic Surgery | Admitting: Orthopedic Surgery

## 2013-10-29 DIAGNOSIS — M25639 Stiffness of unspecified wrist, not elsewhere classified: Secondary | ICD-10-CM | POA: Diagnosis not present

## 2013-10-29 DIAGNOSIS — M629 Disorder of muscle, unspecified: Secondary | ICD-10-CM

## 2013-10-29 DIAGNOSIS — IMO0001 Reserved for inherently not codable concepts without codable children: Secondary | ICD-10-CM | POA: Diagnosis not present

## 2013-10-29 DIAGNOSIS — M79641 Pain in right hand: Secondary | ICD-10-CM

## 2013-10-29 DIAGNOSIS — IMO0002 Reserved for concepts with insufficient information to code with codable children: Secondary | ICD-10-CM

## 2013-10-29 DIAGNOSIS — M25539 Pain in unspecified wrist: Secondary | ICD-10-CM | POA: Diagnosis not present

## 2013-10-29 DIAGNOSIS — M25549 Pain in joints of unspecified hand: Secondary | ICD-10-CM | POA: Insufficient documentation

## 2013-10-29 DIAGNOSIS — M25531 Pain in right wrist: Secondary | ICD-10-CM

## 2013-10-29 DIAGNOSIS — G5601 Carpal tunnel syndrome, right upper limb: Secondary | ICD-10-CM

## 2013-10-29 NOTE — Progress Notes (Signed)
Occupational Therapy Treatment Patient Details  Name: [name] MRN: [id number] Date of Birth: [birth year]  Today's Date: 10/29/2013 Time: 1305-1400 OT Time Calculation (min): 55 min Manual therapy 6761-9509 30' Therapeutic exercises 1335-1400 25'  Visit#: 3 of 12  Re-eval: 11/05/13    Authorization: medicaid approved 3 visits   Authorization Time Period: thru august 3  Authorization Visit#: 1 of 3  Subjective Symptoms/Limitations Symptoms: S:  I havent had pain medicine in a week. Pain Assessment Currently in Pain?: Yes Pain Score: 10-Worst pain ever  Precautions/Restrictions     Exercise/Treatments Elbow Exercises Elbow Flexion: PROM;AROM;Right;Supine;10 reps Elbow Extension: PROM;AROM;Supine;10 reps;Right Forearm Supination: PROM;AROM;Right;10 reps;Supine Forearm Pronation: PROM;AROM;10 reps;Supine;Right Wrist Flexion: PROM;AROM;Right;10 reps;Supine Wrist Extension: PROM;AROM;10 reps;Right;Supine   Sponges: picked up one and translated to palm with max difficulty, picked up 25 sponges 1 x 1 with tongs to promote tripod grasp  Hand Exercises Opposition: 5 reps (to long finger) Sponges: picked up one and translated to palm with max difficulty, picked up 25 sponges 1 x 1 with tongs to promote tripod grasp Other Hand Exercises: grasp and release 10 times Other Hand Exercises: shell board, picked up shells and manipulated with mod dificulty to place into container.  crumpled paper towel with max difficulty     Manual Therapy Manual Therapy: Myofascial release Myofascial Release: Myofascial release and manual massage to right elbow, forearm, wrist, and hand to decrease pain and fascial restrictions and improve pain free mobility in her right arm and hand.  scar massage to all healed surgical incisions.  Occupational Therapy Assessment and Plan OT Assessment and Plan Clinical Impression Statement: A:  improved AROM in digits this date, as well as forearm. OT Plan: P:   Instruct on HEP for functional grasp and release, issue flexion glove.   Goals Short Term Goals Short Term Goal 1: Patient will be educated on a HEP. Short Term Goal 2: [name] and strength will be assessed. Short Term Goal 3: Patient will improve AROM of right hand and wrist by 20 degrees for increased ability to use right hand with functional activities.  Short Term Goal 4: Patient will decrease edema by 1.0 cm. Long Term Goals Long Term Goal 1: Patient will return to prior level of independence with all B/IADLs and leisure activities.  Long Term Goal 2: Patient will have WFL AROM in her right hand and wrist for increased independence with all daily activities.  Long Term Goal 3: Patient will have 50% strength in right hand and wrist vs left for greater ability to maintain grasp on household items. Long Term Goal 4: Patient will have 50% [place] in right hand vs left for increased ability to tie shoes and button buttons.  Long Term Goal 5: Patient will have 3/10 pain in her right arm and hand when completing daily tasks. Long Term Goal 6: Patient will have min-mod fascial restrictions in her right arm. Long Term Goal 7: Patient will decrease edema by 2.0 cm.  Problem List Patient Active Problem List   Diagnosis Date Noted  . Carpal tunnel syndrome 09/03/2013  . Late effect of tendon injury 09/03/2013  . Pain in joint, hand 09/03/2013  . Pain in joint, forearm 09/03/2013  . Unspecified disorder of muscle, ligament, and fascia 09/03/2013  . Compartment syndrome, traumatic, upper extremity 08/04/2013  . Cellulitis of hand, right 08/04/2013  . Chronic pain 01/06/2013  . Depression 01/06/2013  . Hypertension 01/06/2013  . Chest pain 07/18/2012  . Dyspnea 07/18/2012  . VITAMIN B12 DEFICIENCY  08/30/2007  . HOARSENESS 08/30/2007  . PERIODONTITIS, CHRONIC NOS 12/19/2006  . TOBACCO ABUSE 11/22/2006  . OSTEOARTHRITIS 11/22/2006  . Gout, Unspecified 10/31/2006  . DISORDER, BIPOLAR NOS 10/31/2006   . ANXIETY 10/31/2006  . BENZODIAZEPINE ADDICTION 10/31/2006  . ALLERGIC RHINITIS 10/31/2006  . PEPTIC ULCER DISEASE 10/31/2006  . CONSTIPATION NOS 10/31/2006  . ARTHRITIS 10/31/2006  . NECK PAIN 10/31/2006  . LOW BACK PAIN 10/31/2006  . LEG PAIN, CHRONIC 10/31/2006  . CERVICAL CANCER, HX OF 10/31/2006  . RECTAL BLEEDING, HX OF 10/31/2006  . MIGRAINES, HX OF 10/31/2006    End of Session Activity Tolerance: Patient tolerated treatment well General Behavior During Therapy: [employer] for tasks assessed/performed OT Plan of Care OT Home Exercise Plan: sponges for in hand manipulation and contrast bath OT Patient Instructions: handouts given Consulted and Agree with Plan of Care: Patient  [place], OTR/L [phone]  10/29/2013, 2:22 PM

## 2013-11-05 ENCOUNTER — Ambulatory Visit (HOSPITAL_COMMUNITY)
Admission: RE | Admit: 2013-11-05 | Discharge: 2013-11-05 | Disposition: A | Discharge status: Home / Self Care | Payer: Medicaid Other | Source: Ambulatory Visit | Attending: Family Medicine | Admitting: Family Medicine

## 2013-11-05 DIAGNOSIS — IMO0001 Reserved for inherently not codable concepts without codable children: Secondary | ICD-10-CM | POA: Diagnosis not present

## 2013-11-05 NOTE — Progress Notes (Signed)
Occupational Therapy Treatment Patient Details  Name: [name] MRN: [id number] Date of Birth: [birth year]  Today's Date: 11/05/2013 Time: 2951-8841 OT Time Calculation (min): 45 min Manual therapy 1450-1505 15' Therapeutic exercises 6606-3016 10'  Visit#: 4 of 12  Re-eval: 11/19/13    Authorization: medicaid approved 3 visits   Authorization Time Period: thru august 3  Authorization Visit#: 2 of 3  Subjective S:  I have had these little bumps come up on my arm. Pain Assessment Currently in Pain?: Yes Pain Score: 10-Worst pain ever Pain Location: Arm Pain Orientation: Right  Precautions/Restrictions   progress as tolerated  Exercise/Treatments Elbow Exercises Elbow Flexion: PROM;AROM;Right;Supine;10 reps Elbow Extension: PROM;AROM;Supine;10 reps;Right Forearm Supination: PROM;AROM;Right;10 reps;Supine Forearm Pronation: PROM;AROM;10 reps;Supine;Right Wrist Flexion: PROM;AROM;Right;10 reps;Supine Wrist Extension: PROM;AROM;10 reps;Right;Supine      Wrist Exercises Forearm Supination: PROM;AROM;Right;10 reps;Supine Forearm Pronation: PROM;AROM;10 reps;Supine;Right Wrist Flexion: PROM;AROM;Right;10 reps;Supine Wrist Extension: PROM;AROM;10 reps;Right;Supine       Hand Exercises Digit Composite Abduction: AROM;10 reps Digit Composite Adduction: AROM;10 reps Opposition: 5 reps (to ring finger DIPJ) Other Hand Exercises: gross MCPJ flexion x 5 and gross PIPJ/DIPJ flexion (hook position) x 5  Other Hand Exercises: picked up 2 golf balls one by one and then translated to palm, then back to fingertips to drop one by one onto the table     Manual Therapy Myofascial Release: Inspected rash/irritation on patient's right arm and had coworker inspect as well.  Patient was instructed to wash her right arm thoroughly with soap and water and then appy lotion.  Rash appears to be friction rubbing in nature.  Myofascial release and manual massage to right elbow, forearm,  wrist, and hand to decrease pain and fascial restrictions and improve pain free mobility in her right arm and hand. scar massage to all healed surgical incisions.  Occupational Therapy Assessment and Plan OT Assessment and Plan Clinical Impression Statement: A: Patient has slight rash on her right arm, looks to be friction based, recommended following up with MD if concerned.  able to oppose to ring finger this date.  OT Plan: P:  Instruct on HEP for functional grasp and release, issue flexion glove.  Reassess.   Goals Short Term Goals Short Term Goal 1: Patient will be educated on a HEP. Short Term Goal 1 Progress: Progressing toward goal Short Term Goal 2: [place] and strength will be assessed. Short Term Goal 2 Progress: Progressing toward goal Short Term Goal 3: Patient will improve AROM of right hand and wrist by 20 degrees for increased ability to use right hand with functional activities.  Short Term Goal 3 Progress: Progressing toward goal Short Term Goal 4: Patient will decrease edema by 1.0 cm. Short Term Goal 4 Progress: Progressing toward goal Long Term Goals Long Term Goal 1: Patient will return to prior level of independence with all B/IADLs and leisure activities.  Long Term Goal 1 Progress: Progressing toward goal Long Term Goal 2: Patient will have WFL AROM in her right hand and wrist for increased independence with all daily activities.  Long Term Goal 2 Progress: Progressing toward goal Long Term Goal 3: Patient will have 50% strength in right hand and wrist vs left for greater ability to maintain grasp on household items. Long Term Goal 3 Progress: Progressing toward goal Long Term Goal 4: Patient will have 50% [place] in right hand vs left for increased ability to tie shoes and button buttons.  Long Term Goal 4 Progress: Progressing toward goal Long Term Goal 5: Patient  will have 3/10 pain in her right arm and hand when completing daily tasks. Long Term Goal 5 Progress:  Progressing toward goal Long Term Goal 6: Patient will have min-mod fascial restrictions in her right arm. Long Term Goal 7: Patient will decrease edema by 2.0 cm.  Problem List Patient Active Problem List   Diagnosis Date Noted  . Carpal tunnel syndrome 09/03/2013  . Late effect of tendon injury 09/03/2013  . Pain in joint, hand 09/03/2013  . Pain in joint, forearm 09/03/2013  . Unspecified disorder of muscle, ligament, and fascia 09/03/2013  . Compartment syndrome, traumatic, upper extremity 08/04/2013  . Cellulitis of hand, right 08/04/2013  . Chronic pain 01/06/2013  . Depression 01/06/2013  . Hypertension 01/06/2013  . Chest pain 07/18/2012  . Dyspnea 07/18/2012  . VITAMIN B12 DEFICIENCY 08/30/2007  . HOARSENESS 08/30/2007  . PERIODONTITIS, CHRONIC NOS 12/19/2006  . TOBACCO ABUSE 11/22/2006  . OSTEOARTHRITIS 11/22/2006  . Gout, Unspecified 10/31/2006  . DISORDER, BIPOLAR NOS 10/31/2006  . ANXIETY 10/31/2006  . BENZODIAZEPINE ADDICTION 10/31/2006  . ALLERGIC RHINITIS 10/31/2006  . PEPTIC ULCER DISEASE 10/31/2006  . CONSTIPATION NOS 10/31/2006  . ARTHRITIS 10/31/2006  . NECK PAIN 10/31/2006  . LOW BACK PAIN 10/31/2006  . LEG PAIN, CHRONIC 10/31/2006  . CERVICAL CANCER, HX OF 10/31/2006  . RECTAL BLEEDING, HX OF 10/31/2006  . MIGRAINES, HX OF 10/31/2006    End of Session Activity Tolerance: Patient tolerated treatment well General Behavior During Therapy: [facility] for tasks assessed/performed  [place], OTR/L [phone]  11/05/2013, 4:24 PM

## 2013-11-19 ENCOUNTER — Ambulatory Visit (HOSPITAL_COMMUNITY)
Admission: RE | Admit: 2013-11-19 | Discharge: 2013-11-19 | Disposition: A | Discharge status: Home / Self Care | Payer: Medicaid Other | Source: Ambulatory Visit | Attending: Orthopedic Surgery | Admitting: Orthopedic Surgery

## 2013-11-19 DIAGNOSIS — M629 Disorder of muscle, unspecified: Secondary | ICD-10-CM

## 2013-11-19 DIAGNOSIS — M25531 Pain in right wrist: Secondary | ICD-10-CM

## 2013-11-19 DIAGNOSIS — IMO0002 Reserved for concepts with insufficient information to code with codable children: Secondary | ICD-10-CM

## 2013-11-19 DIAGNOSIS — M2420 Disorder of ligament, unspecified site: Secondary | ICD-10-CM

## 2013-11-19 DIAGNOSIS — IMO0001 Reserved for inherently not codable concepts without codable children: Secondary | ICD-10-CM | POA: Diagnosis not present

## 2013-11-19 DIAGNOSIS — G5601 Carpal tunnel syndrome, right upper limb: Secondary | ICD-10-CM

## 2013-11-19 DIAGNOSIS — M79641 Pain in right hand: Secondary | ICD-10-CM

## 2013-11-19 NOTE — Evaluation (Signed)
Occupational Therapy ReEvaluation  Patient Details  Name: [name] MRN: [id number] Date of Birth: June 08, 1960  Today's Date: 11/19/2013 Time: 2703-5009 OT Time Calculation (min): 50 min Manual therapy 1535-1600 25' ROM 1600-1610 10' Splinting 1610-1625 15' Visit#: 5 of 12  Re-eval: 11/19/13  Assessment Diagnosis: S/P Right Forearm Compartmental Syndrome Release with Full Thickness Skin Graft and Fasciaotomies  Authorization: medicaid approved 3 visits   Authorization Time Period: thru august 3  Authorization Visit#: 3 of 3   Past Medical History:  Past Medical History  Diagnosis Date  . Allergic rhinitis   . Anxiety and depression   . Osteoarthritis   . PUD (peptic ulcer disease)   . Gout   . DJD (degenerative joint disease) of lumbar spine   . Chronic back pain   . GI bleed   . Chronic pain syndrome   . Cervical cancer    Past Surgical History:  Past Surgical History  Procedure Laterality Date  . Total abdominal hysterectomy    . Cervival cone    . Tubal ligation    . Appendectomy    . Throat surgery    . I&d extremity Right 08/04/2013    Procedure: IRRIGATION AND DEBRIDEMENT RIGHT HAND AND FOREARM;  Surgeon: Roseanne Kaufman, MD;  Location: [place];  Service: Orthopedics;  Laterality: Right;  . Fasciotomy Right 08/04/2013    Procedure: FASCIOTOMY;  Surgeon: Roseanne Kaufman, MD;  Location: [place];  Service: Orthopedics;  Laterality: Right;  . Application of wound vac Right 08/04/2013    Procedure: APPLICATION OF WOUND VAC;  Surgeon: Roseanne Kaufman, MD;  Location: [place];  Service: Orthopedics;  Laterality: Right;  . I&d extremity Right 08/05/2013    Procedure: IRRIGATION AND DEBRIDEMENT RIGHT HAND AND FOREARM with SECONDARY CLOSURE ;  Surgeon: Roseanne Kaufman, MD;  Location: [place];  Service: Orthopedics;  Laterality: Right;  . I&d extremity Right 08/07/2013    Procedure: IRRIGATION AND DEBRIDEMENT RIGHT ARM, FOREARM, HAND AND SKIN GRAFTING AS NECESSARY TO AFECTED AREAS  (FOREARM AND HAND );  Surgeon: Roseanne Kaufman, MD;  Location: [place];  Service: Orthopedics;  Laterality: Right;  graft from right thigh to arm  . Skin full thickness graft Right 08/07/2013    Procedure: AND SKIN GRAFTING AS NECESSARY TO AFECTED AREAS (FOREARM AND HAND );  Surgeon: Roseanne Kaufman, MD;  Location: [place];  Service: Orthopedics;  Laterality: Right;  skin graft from right thigh    Subjective S: I really need more therapy, I am writing to congress.  I have been doingth e hot and cold Pain Assessment Currently in Pain?: Yes Pain Score: 10-Worst pain ever Pain Location: Arm Pain Orientation: Right Pain Type: Acute pain;Neuropathic pain   Assessment  Additional Assessments RUE AROM (degrees) RUE Overall AROM Comments: assessed in seated (assessed in seated 10/08/13) Right Shoulder Flexion: 105 Degrees (140) Right Shoulder ABduction: 110 Degrees (140) Right Shoulder Internal Rotation: 88 Degrees (80) Right Shoulder External Rotation: 50 Degrees (38) Right Elbow Flexion: 125 (145) Right Elbow Extension:  (lacks 15 degrees from neutral (10 degrees from neutral)) Right Forearm Pronation: 90 Degrees (80) Right Forearm Supination: 72 Degrees (90) Right Wrist Extension: 14 Degrees (10) Right Wrist Flexion: 40 Degrees (50) Right Composite Finger Extension:  (I 0/+32/+22 L 0/0/+24) Right Composite Finger Flexion:  (t 40/20 i 50/70/50 l 60/60/32 r 54/64/32 s 60/60/34) Right Thumb Opposition: Digit 5 (digit 2) Palpation Palpation: max fascial restrictions      Exercise/Treatments    Splinting Splinting: issued flexion glove  for right hand.  educated patient on donning and doffing of flexion glove and patient able to return demonstration.   Occupational Therapy Assessment and Plan OT Assessment and Plan Clinical Impression Statement: A:  Patient discharged from therapy this date due to financial reasons.   OT Plan: P:  DC from skilled OT intervention this date due to financial  reasons.   Goals Short Term Goals Short Term Goal 1: Patient will be educated on a HEP. Short Term Goal 1 Progress: Met Short Term Goal 2: Natural Steps and strength will be assessed. Short Term Goal 2 Progress: Not met Short Term Goal 3: Patient will improve AROM of right hand and wrist by 20 degrees for increased ability to use right hand with functional activities.  Short Term Goal 3 Progress: Not met Short Term Goal 4: Patient will decrease edema by 1.0 cm. Short Term Goal 4 Progress: Met Short Term Goal 5: Patient will decrease wound size by 50%. Short Term Goal 5 Progress: Met Long Term Goals Long Term Goal 1: Patient will return to prior level of independence with all B/IADLs and leisure activities.  Long Term Goal 1 Progress: Not met Long Term Goal 2: Patient will have WFL AROM in her right hand and wrist for increased independence with all daily activities.  Long Term Goal 2 Progress: Not met Long Term Goal 3: Patient will have 50% strength in right hand and wrist vs left for greater ability to maintain grasp on household items. Long Term Goal 3 Progress: Not met Long Term Goal 4: Patient will have 50% [place] in right hand vs left for increased ability to tie shoes and button buttons.  Long Term Goal 4 Progress: Not met Long Term Goal 5: Patient will have 3/10 pain in her right arm and hand when completing daily tasks. Long Term Goal 5 Progress: Not met Long Term Goal 6: Patient will have min-mod fascial restrictions in her right arm. Long Term Goal 7: Patient will decrease edema by 2.0 cm. Long Term Goal 8: Patients wound will be fully healed with a 100% granulated wound bed.   Problem List Patient Active Problem List   Diagnosis Date Noted  . Carpal tunnel syndrome 09/03/2013  . Late effect of tendon injury 09/03/2013  . Pain in joint, hand 09/03/2013  . Pain in joint, forearm 09/03/2013  . Unspecified disorder of muscle, ligament, and fascia 09/03/2013  . Compartment syndrome,  traumatic, upper extremity 08/04/2013  . Cellulitis of hand, right 08/04/2013  . Chronic pain 01/06/2013  . Depression 01/06/2013  . Hypertension 01/06/2013  . Chest pain 07/18/2012  . Dyspnea 07/18/2012  . VITAMIN B12 DEFICIENCY 08/30/2007  . HOARSENESS 08/30/2007  . PERIODONTITIS, CHRONIC NOS 12/19/2006  . TOBACCO ABUSE 11/22/2006  . OSTEOARTHRITIS 11/22/2006  . Gout, Unspecified 10/31/2006  . DISORDER, BIPOLAR NOS 10/31/2006  . ANXIETY 10/31/2006  . BENZODIAZEPINE ADDICTION 10/31/2006  . ALLERGIC RHINITIS 10/31/2006  . PEPTIC ULCER DISEASE 10/31/2006  . CONSTIPATION NOS 10/31/2006  . ARTHRITIS 10/31/2006  . NECK PAIN 10/31/2006  . LOW BACK PAIN 10/31/2006  . LEG PAIN, CHRONIC 10/31/2006  . CERVICAL CANCER, HX OF 10/31/2006  . RECTAL BLEEDING, HX OF 10/31/2006  . MIGRAINES, HX OF 10/31/2006    End of Session Activity Tolerance: Patient tolerated treatment well General Behavior During Therapy: [facility] for tasks assessed/performed  Anon Raices, OTR/L [phone]  11/19/2013, 4:28 PM  Physician Documentation Your signature is required to indicate approval of the treatment plan as  stated above.  Please sign and either send electronically or make a copy of this report for your files and return this physician signed original.  Please mark one 1.__approve of plan  2. ___approve of plan with the following conditions.   ______________________________                                                          _____________________ Physician Signature                                                                                                             Date

## 2013-11-27 ENCOUNTER — Other Ambulatory Visit (HOSPITAL_COMMUNITY): Payer: Self-pay | Admitting: Family Medicine

## 2013-11-27 ENCOUNTER — Ambulatory Visit (HOSPITAL_COMMUNITY): Payer: Medicaid Other

## 2013-11-27 DIAGNOSIS — R928 Other abnormal and inconclusive findings on diagnostic imaging of breast: Secondary | ICD-10-CM

## 2013-12-04 ENCOUNTER — Ambulatory Visit (HOSPITAL_COMMUNITY)
Admission: RE | Admit: 2013-12-04 | Discharge: 2013-12-04 | Disposition: A | Discharge status: Home / Self Care | Payer: Medicaid Other | Source: Ambulatory Visit | Attending: Family Medicine | Admitting: Family Medicine

## 2013-12-04 ENCOUNTER — Other Ambulatory Visit (HOSPITAL_COMMUNITY): Payer: Self-pay | Admitting: Family Medicine

## 2013-12-04 DIAGNOSIS — R928 Other abnormal and inconclusive findings on diagnostic imaging of breast: Secondary | ICD-10-CM | POA: Insufficient documentation

## 2013-12-20 ENCOUNTER — Emergency Department (HOSPITAL_COMMUNITY): Payer: Medicaid Other

## 2013-12-20 ENCOUNTER — Emergency Department (HOSPITAL_COMMUNITY)
Admission: EM | Admit: 2013-12-20 | Discharge: 2013-12-20 | Disposition: A | Discharge status: Home / Self Care | Payer: Medicaid Other | Attending: Emergency Medicine | Admitting: Emergency Medicine

## 2013-12-20 ENCOUNTER — Encounter (HOSPITAL_COMMUNITY): Payer: Self-pay | Admitting: Emergency Medicine

## 2013-12-20 DIAGNOSIS — G8929 Other chronic pain: Secondary | ICD-10-CM | POA: Diagnosis not present

## 2013-12-20 DIAGNOSIS — M47817 Spondylosis without myelopathy or radiculopathy, lumbosacral region: Secondary | ICD-10-CM | POA: Diagnosis not present

## 2013-12-20 DIAGNOSIS — F17200 Nicotine dependence, unspecified, uncomplicated: Secondary | ICD-10-CM | POA: Diagnosis not present

## 2013-12-20 DIAGNOSIS — Z8711 Personal history of peptic ulcer disease: Secondary | ICD-10-CM | POA: Insufficient documentation

## 2013-12-20 DIAGNOSIS — Z8639 Personal history of other endocrine, nutritional and metabolic disease: Secondary | ICD-10-CM | POA: Insufficient documentation

## 2013-12-20 DIAGNOSIS — R079 Chest pain, unspecified: Secondary | ICD-10-CM | POA: Insufficient documentation

## 2013-12-20 DIAGNOSIS — G894 Chronic pain syndrome: Secondary | ICD-10-CM | POA: Insufficient documentation

## 2013-12-20 DIAGNOSIS — F3289 Other specified depressive episodes: Secondary | ICD-10-CM | POA: Insufficient documentation

## 2013-12-20 DIAGNOSIS — Z8541 Personal history of malignant neoplasm of cervix uteri: Secondary | ICD-10-CM | POA: Diagnosis not present

## 2013-12-20 DIAGNOSIS — R42 Dizziness and giddiness: Secondary | ICD-10-CM | POA: Diagnosis not present

## 2013-12-20 DIAGNOSIS — Z79899 Other long term (current) drug therapy: Secondary | ICD-10-CM | POA: Insufficient documentation

## 2013-12-20 DIAGNOSIS — F411 Generalized anxiety disorder: Secondary | ICD-10-CM | POA: Diagnosis not present

## 2013-12-20 DIAGNOSIS — Z862 Personal history of diseases of the blood and blood-forming organs and certain disorders involving the immune mechanism: Secondary | ICD-10-CM | POA: Diagnosis not present

## 2013-12-20 DIAGNOSIS — F329 Major depressive disorder, single episode, unspecified: Secondary | ICD-10-CM | POA: Insufficient documentation

## 2013-12-20 DIAGNOSIS — Z792 Long term (current) use of antibiotics: Secondary | ICD-10-CM | POA: Insufficient documentation

## 2013-12-20 DIAGNOSIS — R1013 Epigastric pain: Secondary | ICD-10-CM | POA: Insufficient documentation

## 2013-12-20 LAB — COMPREHENSIVE METABOLIC PANEL
ALBUMIN: 4.3 g/dL (ref 3.5–5.2)
ALT: 36 U/L — ABNORMAL HIGH (ref 0–35)
AST: 26 U/L (ref 0–37)
Alkaline Phosphatase: 97 U/L (ref 39–117)
Anion gap: 13 (ref 5–15)
BILIRUBIN TOTAL: 0.3 mg/dL (ref 0.3–1.2)
BUN: 12 mg/dL (ref 6–23)
CHLORIDE: 99 meq/L (ref 96–112)
CO2: 26 mEq/L (ref 19–32)
Calcium: 10.1 mg/dL (ref 8.4–10.5)
Creatinine, Ser: 0.76 mg/dL (ref 0.50–1.10)
GFR calc Af Amer: >90 mL/min (ref >90)
GFR calc non Af Amer: >90 mL/min (ref >90)
Glucose, Bld: 133 mg/dL — ABNORMAL HIGH (ref 70–99)
Potassium: 4.1 mEq/L (ref 3.7–5.3)
SODIUM: 138 meq/L (ref 137–147)
Total Protein: 8.5 g/dL — ABNORMAL HIGH (ref 6.0–8.3)

## 2013-12-20 LAB — CBC WITH DIFFERENTIAL/PLATELET
BASOS ABS: 0 10*3/uL (ref 0.0–0.1)
Basophils Relative: 0 % (ref 0–1)
Eosinophils Absolute: 0.2 10*3/uL (ref 0.0–0.7)
Eosinophils Relative: 2 % (ref 0–5)
HCT: 42.3 % (ref 36.0–46.0)
Hemoglobin: 14.7 g/dL (ref 12.0–15.0)
Lymphocytes Relative: 24 % (ref 12–46)
Lymphs Abs: 2.3 10*3/uL (ref 0.7–4.0)
MCH: 30.2 pg (ref 26.0–34.0)
MCHC: 34.8 g/dL (ref 30.0–36.0)
MCV: 86.9 fL (ref 78.0–100.0)
Monocytes Absolute: 0.6 10*3/uL (ref 0.1–1.0)
Monocytes Relative: 6 % (ref 3–12)
NEUTROS PCT: 68 % (ref 43–77)
Neutro Abs: 6.6 10*3/uL (ref 1.7–7.7)
PLATELETS: 316 10*3/uL (ref 150–400)
RBC: 4.87 MIL/uL (ref 3.87–5.11)
RDW: 15.7 % — AB (ref 11.5–15.5)
WBC: 9.7 10*3/uL (ref 4.0–10.5)

## 2013-12-20 LAB — LIPASE, BLOOD: Lipase: 26 U/L (ref 11–59)

## 2013-12-20 LAB — SALICYLATE LEVEL: Salicylate Lvl: 11.8 mg/dL (ref 2.8–20.0)

## 2013-12-20 LAB — TROPONIN I: Troponin I: <0.3 ng/mL (ref <0.30)

## 2013-12-20 LAB — D-DIMER, QUANTITATIVE: D-Dimer, Quant: 0.34 ug/mL-FEU (ref 0.00–0.48)

## 2013-12-20 MED ORDER — ONDANSETRON HCL 4 MG/2ML IJ SOLN
INTRAMUSCULAR | Status: AC
  Filled 2013-12-20: qty 2

## 2013-12-20 MED ORDER — ONDANSETRON HCL 4 MG/2ML IJ SOLN
4.0000 mg | Freq: Once | INTRAMUSCULAR | Status: AC
  Administered 2013-12-20: 4 mg via INTRAVENOUS

## 2013-12-20 MED ORDER — PANTOPRAZOLE SODIUM 40 MG IV SOLR
40.0000 mg | Freq: Once | INTRAVENOUS | Status: AC
  Administered 2013-12-20: 40 mg via INTRAVENOUS
  Filled 2013-12-20: qty 40

## 2013-12-20 MED ORDER — KETOROLAC TROMETHAMINE 30 MG/ML IJ SOLN
30.0000 mg | Freq: Once | INTRAMUSCULAR | Status: AC
  Administered 2013-12-20: 30 mg via INTRAVENOUS
  Filled 2013-12-20: qty 1

## 2013-12-20 MED ORDER — HYDROMORPHONE HCL PF 1 MG/ML IJ SOLN
1.0000 mg | Freq: Once | INTRAMUSCULAR | Status: AC
  Administered 2013-12-20: 1 mg via INTRAVENOUS
  Filled 2013-12-20: qty 1

## 2013-12-20 MED ORDER — MORPHINE SULFATE 4 MG/ML IJ SOLN
4.0000 mg | Freq: Once | INTRAMUSCULAR | Status: AC
  Administered 2013-12-20: 4 mg via INTRAVENOUS
  Filled 2013-12-20: qty 1

## 2013-12-20 MED ORDER — GI COCKTAIL ~~LOC~~
30.0000 mL | Freq: Once | ORAL | Status: AC
  Administered 2013-12-20: 30 mL via ORAL
  Filled 2013-12-20: qty 30

## 2013-12-20 NOTE — ED Notes (Signed)
Pt claims her right arm has some areas of redness that were not there before and that the redness started after the IV was placed in the right arm.  Dr Dina Rich looked at the pt's right arm to verify there was not an issue with the arm at this time and it appears normal except for the old scar that is present from previous surgery.

## 2013-12-20 NOTE — Discharge Instructions (Signed)
You were seen today for Chest pain.  YOu were screened for heart disease, blood clots, pneumonia, and collapsed lungs.  Your pain seems to be related to musculoskeletal pain.  YOu need to follow-up with your primary doctor and pain management doctor.  Chest Wall Pain    Chest wall pain is pain in or around the bones and muscles of your chest. It may take up to 6 weeks to get better. It may take longer if you must stay physically active in your work and activities.  CAUSES  Chest wall pain may happen on its own. However, it may be caused by:  A viral illness like the flu.  Injury.  Coughing.  Exercise.  Arthritis.  Fibromyalgia.  Shingles. HOME CARE INSTRUCTIONS   Avoid overtiring physical activity. Try not to strain or perform activities that cause pain. This includes any activities using your chest or your abdominal and side muscles, especially if heavy weights are used.  Put ice on the sore area.  Put ice in a plastic bag.  Place a towel between your skin and the bag.  Leave the ice on for 15-20 minutes per hour while awake for the first 2 days.  Only take over-the-counter or prescription medicines for pain, discomfort, or fever as directed by your caregiver. SEEK IMMEDIATE MEDICAL CARE IF:   Your pain increases, or you are very uncomfortable.  You have a fever.  Your chest pain becomes worse.  You have new, unexplained symptoms.  You have nausea or vomiting.  You feel sweaty or lightheaded.  You have a cough with phlegm (sputum), or you cough up blood. MAKE SURE YOU:   Understand these instructions.  Will watch your condition.  Will get help right away if you are not doing well or get worse. Document Released: 04/12/2005 Document Revised: 07/05/2011 Document Reviewed: 12/07/2010 [facility] Patient Information 2015 [place], [place]. This information is not intended to replace advice given to you by your health care provider. Make sure you discuss any  questions you have with your health care provider.

## 2013-12-20 NOTE — ED Provider Notes (Signed)
CSN: [id number]     Arrival date & time 12/20/13  9470 History   First MD Initiated Contact with Patient 12/20/13 0518     Chief Complaint  Patient presents with  . Chest Pain     (Consider location/radiation/quality/duration/timing/severity/associated sxs/prior Treatment) HPI  This is a 53 year old female with a history of anxiety, GERD, peptic ulcer disease, chronic pain syndrome who presents for chest pain. Patient reports a three-day history of left sided chest pain under her left breast. She states it is constant. Nothing makes it better or worse. She denies any shortness of breath. She describes it as a tightness. She states that her home pain medications do not help. She states "it's almost like my ribs are broken." She denies any injury. Took 11 ASA prior to arrival.  Denies tinnitis.  Patient also reports persistent burning anterior chest pain that got worse overnight. She has a history of GERD and feels like this is her "indigestion." That is worse with food.  She states that she feels like she's had to burp all night. She has associated diaphoresis and "hot flashes." She continues to smoke. Denies any exertional component to her pain. Denies any leg swelling or history of blood clots.  I reviewed the patient's chart. She had a Myoview in 2012 which showed nonspecific T wave changes with stress. She's never had a cardiac catheterization. She's had several evaluations and admissions for chest pain rule out.  Past Medical History  Diagnosis Date  . Allergic rhinitis   . Anxiety and depression   . Osteoarthritis   . PUD (peptic ulcer disease)   . Gout   . DJD (degenerative joint disease) of lumbar spine   . Chronic back pain   . GI bleed   . Chronic pain syndrome   . Cervical cancer    Past Surgical History  Procedure Laterality Date  . Total abdominal hysterectomy    . Cervival cone    . Tubal ligation    . Appendectomy    . Throat surgery    . I&d extremity Right  08/04/2013    Procedure: IRRIGATION AND DEBRIDEMENT RIGHT HAND AND FOREARM;  Surgeon: Roseanne Kaufman, MD;  Location: [place];  Service: Orthopedics;  Laterality: Right;  . Fasciotomy Right 08/04/2013    Procedure: FASCIOTOMY;  Surgeon: Roseanne Kaufman, MD;  Location: [place];  Service: Orthopedics;  Laterality: Right;  . Application of wound vac Right 08/04/2013    Procedure: APPLICATION OF WOUND VAC;  Surgeon: Roseanne Kaufman, MD;  Location: [place];  Service: Orthopedics;  Laterality: Right;  . I&d extremity Right 08/05/2013    Procedure: IRRIGATION AND DEBRIDEMENT RIGHT HAND AND FOREARM with SECONDARY CLOSURE ;  Surgeon: Roseanne Kaufman, MD;  Location: [place];  Service: Orthopedics;  Laterality: Right;  . I&d extremity Right 08/07/2013    Procedure: IRRIGATION AND DEBRIDEMENT RIGHT ARM, FOREARM, HAND AND SKIN GRAFTING AS NECESSARY TO AFECTED AREAS (FOREARM AND HAND );  Surgeon: Roseanne Kaufman, MD;  Location: [place];  Service: Orthopedics;  Laterality: Right;  graft from right thigh to arm  . Skin full thickness graft Right 08/07/2013    Procedure: AND SKIN GRAFTING AS NECESSARY TO AFECTED AREAS (FOREARM AND HAND );  Surgeon: Roseanne Kaufman, MD;  Location: [place];  Service: Orthopedics;  Laterality: Right;  skin graft from right thigh   Family History  Problem Relation Age of Onset  . Coronary artery disease Father   . Hypertension Father   . Diabetes type II Mother   .  Alcohol abuse Mother    History  Substance Use Topics  . Smoking status: Current Every Day Smoker -- 2.00 packs/day for 40 years    Types: Cigarettes  . Smokeless tobacco: Never Used  . Alcohol Use: No   OB History   Grav Para Term Preterm Abortions TAB SAB Ect Mult Living                 Review of Systems  Constitutional: Negative for fever.  Respiratory: Positive for chest tightness. Negative for cough and shortness of breath.   Cardiovascular: Positive for chest pain. Negative for palpitations and leg swelling.   Gastrointestinal: Negative for nausea, vomiting and abdominal pain.  Genitourinary: Negative for dysuria.  Musculoskeletal: Negative for back pain.  Skin: Negative for rash.  Neurological: Positive for dizziness. Negative for headaches.  Psychiatric/Behavioral: Negative for confusion.  All other systems reviewed and are negative.     Allergies  Vicodin  Home Medications   Prior to Admission medications   Medication Sig Start Date End Date Taking? Authorizing Provider  ALPRAZolam Duanne Moron) 1 MG tablet Take 1 tablet (1 mg total) by mouth 2 (two) times daily as needed for anxiety. 08/10/13  Yes Ivan Croft, PA-C  CYMBALTA 60 MG capsule Take 60 mg by mouth at bedtime. 03/19/12  Yes Historical Provider, MD  omeprazole (PRILOSEC) 40 MG capsule Take 40 mg by mouth daily.  03/29/12  Yes Historical Provider, MD  oxycodone (ROXICODONE) 30 MG immediate release tablet Take 1 tablet (30 mg total) by mouth every 6 (six) hours as needed for pain. 08/10/13  Yes Ivan Croft, PA-C  triamterene-hydrochlorothiazide (MAXZIDE-25) 37.5-25 MG per tablet Take 1 tablet by mouth daily as needed. Take when blood pressure is high 04/08/12  Yes Historical Provider, MD  ALPRAZolam Duanne Moron) 1 MG tablet Take 1 mg by mouth 2 (two) times daily as needed for anxiety.     Historical Provider, MD  cephALEXin (KEFLEX) 500 MG capsule Take 1 capsule (500 mg total) by mouth 4 (four) times daily. 08/10/13   Ivan Croft, PA-C  cyclobenzaprine (FLEXERIL) 10 MG tablet Take 10 mg by mouth 3 (three) times daily as needed. For muscle spasms     Historical Provider, MD  cyclobenzaprine (FLEXERIL) 10 MG tablet Take 1 tablet (10 mg total) by mouth 3 (three) times daily as needed for muscle spasms. 08/10/13   Ivan Croft, PA-C  nitroGLYCERIN (NITROSTAT) 0.3 MG SL tablet Place 0.3 mg under the tongue every 5 (five) minutes as needed for chest pain.    Historical Provider, MD  oxycodone (ROXICODONE) 30 MG immediate release  tablet Take 30 mg by mouth 4 (four) times daily.     Historical Provider, MD   BP 142/88  Pulse 70  Temp(Src) 98 F (36.7 C) (Oral)  Resp 11  Ht 5\' 4"  (1.626 m)  Wt 186 lb (84.369 kg)  BMI 31.91 kg/m2  SpO2 97%  LMP 09/16/2000 Physical Exam  Nursing note and vitals reviewed. Constitutional: She is oriented to person, place, and time.  Anxious appearing and flighty  HENT:  Head: Normocephalic and atraumatic.  Eyes: Pupils are equal, round, and reactive to light.  Neck: Neck supple.  Cardiovascular: Normal rate, regular rhythm and normal heart sounds.   No murmur heard. Pulmonary/Chest: Effort normal and breath sounds normal. No respiratory distress. She has no wheezes. She exhibits tenderness.  Tenderness to palpation over the left chest wall  Abdominal: Soft. Bowel sounds are normal. There is tenderness. There  is no rebound and no guarding.  Mild epigastric tenderness to palpation  Musculoskeletal: She exhibits no edema.  Extensive well healed scar over right forearm  Neurological: She is alert and oriented to person, place, and time.  Skin: Skin is warm and dry.  Psychiatric: She has a normal mood and affect.    ED Course  Procedures (including critical care time) Labs Review Labs Reviewed  CBC WITH DIFFERENTIAL - Abnormal; Notable for the following:    RDW 15.7 (*)    All other components within normal limits  COMPREHENSIVE METABOLIC PANEL - Abnormal; Notable for the following:    Glucose, Bld 133 (*)    Total Protein 8.5 (*)    ALT 36 (*)    All other components within normal limits  LIPASE, BLOOD  TROPONIN I  SALICYLATE LEVEL  D-DIMER, QUANTITATIVE    Imaging Review Dg Chest 2 View  12/20/2013   CLINICAL DATA:  Chest pain  EXAM: CHEST  2 VIEW  COMPARISON:  01/06/2013  FINDINGS: Normal heart size and pulmonary vascularity. No focal airspace disease or consolidation in the lungs. No blunting of costophrenic angles. No pneumothorax. Mediastinal contours appear  intact.  IMPRESSION: No active cardiopulmonary disease.   Electronically Signed   By: Lucienne Capers M.D.   On: 12/20/2013 06:25     EKG Interpretation   Date/Time:  Thursday December 20 2013 05:24:18 EDT Ventricular Rate:  69 PR Interval:  169 QRS Duration: 107 QT Interval:  459 QTC Calculation: 492 R Axis:   79 Text Interpretation:  Sinus rhythm Multiple ventricular premature  complexes Nonspecific T abnrm, anterolateral leads Borderline prolonged QT  interval No significant change from prior Confirmed by HORTON  MD,  COURTNEY ([id number]) on 12/20/2013 5:37:28 AM      MDM   Final diagnoses:  Chest pain, unspecified chest pain type  Chronic pain   Patient presents with chest pain and "indigestion."  Nontoxic and VS stable.  Reproducible pain on exam.  Pain very atypical for ACS.  Patient given GI cocktail and pain medication.  EKG with nonspecific T waves changes and unchanged from prior.  Troponin and Chest xray reassuring.  GIven constant pain for >2 days, do not feel patient needs a delta.  Dimer is neg to screen for PE.  Patient continues to complain of pain and was redosed pain medication and Toradol.  Discussed with patient that I felt her pain was likely musculoskeletal in nature.  She is followed by pain management and is already on oxycodone.  Have encouraged patient to add anti-inflammatories.  Patient to follow-up with pain management and PCP.  At this time, doubt emergent medical condition.  After history, exam, and medical workup I feel the patient has been appropriately medically screened and is safe for discharge home. Pertinent diagnoses were discussed with the patient. Patient was given return precautions.    Merryl Hacker, MD 12/20/13 2005

## 2013-12-20 NOTE — ED Notes (Signed)
Pt. Reports chest tightness x3 days. Pt. Reports pain below breasts. Pt. Reports taking 3 Asprin at 11pm pt. Reports taking 4 Asprin at 2am and 4 more Asprin  prior to arrival. Pt. C/o dizziness and intermittent hot flashes.

## 2013-12-20 NOTE — ED Notes (Signed)
MD at bedside. 

## 2014-02-01 ENCOUNTER — Ambulatory Visit (HOSPITAL_COMMUNITY)
Admission: RE | Admit: 2014-02-01 | Discharge: 2014-02-01 | Disposition: A | Discharge status: Home / Self Care | Payer: Medicaid Other | Source: Ambulatory Visit | Attending: Orthopedic Surgery | Admitting: Orthopedic Surgery

## 2014-02-01 NOTE — Progress Notes (Signed)
  Patient Details  Name: [name] MRN: [id number] Date of Birth: Jul 22, 1960  Today's Date: 02/01/2014   Pt arrived for evaluation this session.  Discussed with pt that she already had 1 OT evaluation and 3 OT treatments this year, and that Medicaid will not pay for further vistis at this time.  Pt described her inability to pay for sessions at this time and her attempts to receive financial aid for further therapy.  Requested phone number to follow up with application for financial aid.  Provided pt with phone number for [facility] patient accounting.  Pt described her attempts to provide therapy to herself and demonstrates high motivation to continue therapy services.  Provided emotional support to pt, and suggested she re-contact therapy when she know the outcome of financial aid requests.  Bea Graff Shadrach Bartunek, MS, OTR/L [place] [phone] 02/01/2014, 3:17 PM

## 2014-02-14 ENCOUNTER — Emergency Department (HOSPITAL_COMMUNITY)
Admission: EM | Admit: 2014-02-14 | Discharge: 2014-02-15 | Disposition: A | Discharge status: Home / Self Care | Payer: Medicaid Other | Attending: Emergency Medicine | Admitting: Emergency Medicine

## 2014-02-14 ENCOUNTER — Encounter (HOSPITAL_COMMUNITY): Payer: Self-pay | Admitting: Emergency Medicine

## 2014-02-14 DIAGNOSIS — Z79899 Other long term (current) drug therapy: Secondary | ICD-10-CM | POA: Diagnosis not present

## 2014-02-14 DIAGNOSIS — Z792 Long term (current) use of antibiotics: Secondary | ICD-10-CM | POA: Insufficient documentation

## 2014-02-14 DIAGNOSIS — G894 Chronic pain syndrome: Secondary | ICD-10-CM | POA: Diagnosis not present

## 2014-02-14 DIAGNOSIS — X19XXXA Contact with other heat and hot substances, initial encounter: Secondary | ICD-10-CM | POA: Insufficient documentation

## 2014-02-14 DIAGNOSIS — F418 Other specified anxiety disorders: Secondary | ICD-10-CM | POA: Insufficient documentation

## 2014-02-14 DIAGNOSIS — Z8541 Personal history of malignant neoplasm of cervix uteri: Secondary | ICD-10-CM | POA: Insufficient documentation

## 2014-02-14 DIAGNOSIS — T22232A Burn of second degree of left upper arm, initial encounter: Secondary | ICD-10-CM | POA: Diagnosis not present

## 2014-02-14 DIAGNOSIS — T300 Burn of unspecified body region, unspecified degree: Secondary | ICD-10-CM

## 2014-02-14 DIAGNOSIS — Y9389 Activity, other specified: Secondary | ICD-10-CM | POA: Diagnosis not present

## 2014-02-14 DIAGNOSIS — Z720 Tobacco use: Secondary | ICD-10-CM | POA: Diagnosis not present

## 2014-02-14 DIAGNOSIS — Y9289 Other specified places as the place of occurrence of the external cause: Secondary | ICD-10-CM | POA: Diagnosis not present
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
## 2014-02-15 MED ORDER — BACITRACIN ZINC 500 UNIT/GM EX OINT
TOPICAL_OINTMENT | Freq: Once | CUTANEOUS | Status: AC
  Administered 2014-02-15: 1 via TOPICAL
  Filled 2014-02-15: qty 0.9
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
## 2014-02-15 NOTE — ED Notes (Signed)
Dr. James at bedside  

## 2014-02-15 NOTE — Discharge Instructions (Signed)
Wash the burn daily with soap and water. Apply antibiotic ointment daily, and keep covered with a dressing.  Burn Care Your skin is a natural barrier to infection. It is the largest organ of your body. Burns damage this natural protection. To help prevent infection, it is very important to follow your caregiver's instructions in the care of your burn. Burns are classified as:  First degree. There is only redness of the skin (erythema). No scarring is expected.  Second degree. There is blistering of the skin. Scarring may occur with deeper burns.  Third degree. All layers of the skin are injured, and scarring is expected. HOME CARE INSTRUCTIONS   Wash your hands well before changing your bandage.  Change your bandage as often as directed by your caregiver.  Remove the old bandage. If the bandage sticks, you may soak it off with cool, clean water.  Cleanse the burn thoroughly but gently with mild soap and water.  Pat the area dry with a clean, dry cloth.  Apply a thin layer of antibacterial cream to the burn.  Apply a clean bandage as instructed by your caregiver.  Keep the bandage as clean and dry as possible.  Elevate the affected area for the first 24 hours, then as instructed by your caregiver.  Only take over-the-counter or prescription medicines for pain, discomfort, or fever as directed by your caregiver. SEEK IMMEDIATE MEDICAL CARE IF:   You develop excessive pain.  You develop redness, tenderness, swelling, or red streaks near the burn.  The burned area develops yellowish-white fluid (pus) or a bad smell.  You have a fever. MAKE SURE YOU:   Understand these instructions.  Will watch your condition.  Will get help right away if you are not doing well or get worse. Document Released: 04/12/2005 Document Revised: 07/05/2011 Document Reviewed: 09/02/2010 [facility] Patient Information 2015 [place], [place]. This information is not intended to replace advice given to  you by your health care provider. Make sure you discuss any questions you have with your health care provider.

## 2014-03-22 ENCOUNTER — Emergency Department (HOSPITAL_COMMUNITY)
Admission: EM | Admit: 2014-03-22 | Discharge: 2014-03-22 | Discharge status: Against Medical Advice | Payer: Medicaid Other | Attending: Emergency Medicine | Admitting: Emergency Medicine

## 2014-03-22 ENCOUNTER — Emergency Department (HOSPITAL_COMMUNITY): Payer: Medicaid Other

## 2014-03-22 ENCOUNTER — Encounter (HOSPITAL_COMMUNITY): Payer: Self-pay | Admitting: Emergency Medicine

## 2014-03-22 DIAGNOSIS — R413 Other amnesia: Secondary | ICD-10-CM | POA: Diagnosis not present

## 2014-03-22 DIAGNOSIS — G8929 Other chronic pain: Secondary | ICD-10-CM | POA: Insufficient documentation

## 2014-03-22 DIAGNOSIS — Z720 Tobacco use: Secondary | ICD-10-CM | POA: Insufficient documentation

## 2014-03-22 DIAGNOSIS — R079 Chest pain, unspecified: Secondary | ICD-10-CM

## 2014-03-22 DIAGNOSIS — Z79899 Other long term (current) drug therapy: Secondary | ICD-10-CM | POA: Insufficient documentation

## 2014-03-22 DIAGNOSIS — S0990XA Unspecified injury of head, initial encounter: Secondary | ICD-10-CM | POA: Insufficient documentation

## 2014-03-22 DIAGNOSIS — S8992XA Unspecified injury of left lower leg, initial encounter: Secondary | ICD-10-CM | POA: Diagnosis not present

## 2014-03-22 DIAGNOSIS — Z8719 Personal history of other diseases of the digestive system: Secondary | ICD-10-CM | POA: Insufficient documentation

## 2014-03-22 DIAGNOSIS — Y998 Other external cause status: Secondary | ICD-10-CM | POA: Diagnosis not present

## 2014-03-22 DIAGNOSIS — W08XXXA Fall from other furniture, initial encounter: Secondary | ICD-10-CM | POA: Diagnosis not present

## 2014-03-22 DIAGNOSIS — Y9389 Activity, other specified: Secondary | ICD-10-CM | POA: Insufficient documentation

## 2014-03-22 DIAGNOSIS — S199XXA Unspecified injury of neck, initial encounter: Secondary | ICD-10-CM | POA: Insufficient documentation

## 2014-03-22 DIAGNOSIS — M79605 Pain in left leg: Secondary | ICD-10-CM

## 2014-03-22 DIAGNOSIS — S29001A Unspecified injury of muscle and tendon of front wall of thorax, initial encounter: Secondary | ICD-10-CM | POA: Insufficient documentation

## 2014-03-22 DIAGNOSIS — W19XXXA Unspecified fall, initial encounter: Secondary | ICD-10-CM

## 2014-03-22 DIAGNOSIS — R519 Headache, unspecified: Secondary | ICD-10-CM

## 2014-03-22 DIAGNOSIS — Z8541 Personal history of malignant neoplasm of cervix uteri: Secondary | ICD-10-CM | POA: Diagnosis not present

## 2014-03-22 DIAGNOSIS — M542 Cervicalgia: Secondary | ICD-10-CM

## 2014-03-22 DIAGNOSIS — Z792 Long term (current) use of antibiotics: Secondary | ICD-10-CM | POA: Diagnosis not present

## 2014-03-22 DIAGNOSIS — R51 Headache: Secondary | ICD-10-CM

## 2014-03-22 DIAGNOSIS — Y92009 Unspecified place in unspecified non-institutional (private) residence as the place of occurrence of the external cause: Secondary | ICD-10-CM | POA: Insufficient documentation

## 2014-03-22 LAB — CBC WITH DIFFERENTIAL/PLATELET
BASOS ABS: 0 10*3/uL (ref 0.0–0.1)
BASOS PCT: 0 % (ref 0–1)
Eosinophils Absolute: 0.1 10*3/uL (ref 0.0–0.7)
Eosinophils Relative: 2 % (ref 0–5)
HCT: 46.2 % — ABNORMAL HIGH (ref 36.0–46.0)
HEMOGLOBIN: 16.3 g/dL — AB (ref 12.0–15.0)
Lymphocytes Relative: 18 % (ref 12–46)
Lymphs Abs: 1.7 10*3/uL (ref 0.7–4.0)
MCH: 30.6 pg (ref 26.0–34.0)
MCHC: 35.3 g/dL (ref 30.0–36.0)
MCV: 86.8 fL (ref 78.0–100.0)
Monocytes Absolute: 0.7 10*3/uL (ref 0.1–1.0)
Monocytes Relative: 8 % (ref 3–12)
NEUTROS ABS: 6.7 10*3/uL (ref 1.7–7.7)
NEUTROS PCT: 72 % (ref 43–77)
Platelets: 274 10*3/uL (ref 150–400)
RBC: 5.32 MIL/uL — ABNORMAL HIGH (ref 3.87–5.11)
RDW: 14.3 % (ref 11.5–15.5)
WBC: 9.3 10*3/uL (ref 4.0–10.5)

## 2014-03-22 LAB — BASIC METABOLIC PANEL
ANION GAP: 15 (ref 5–15)
BUN: 12 mg/dL (ref 6–23)
CALCIUM: 9.5 mg/dL (ref 8.4–10.5)
CO2: 27 mEq/L (ref 19–32)
CREATININE: 0.83 mg/dL (ref 0.50–1.10)
Chloride: 95 mEq/L — ABNORMAL LOW (ref 96–112)
GFR calc Af Amer: >90 mL/min (ref >90)
GFR calc non Af Amer: 78 mL/min — ABNORMAL LOW (ref >90)
Glucose, Bld: 105 mg/dL — ABNORMAL HIGH (ref 70–99)
Potassium: 3.2 mEq/L — ABNORMAL LOW (ref 3.7–5.3)
Sodium: 137 mEq/L (ref 137–147)

## 2014-03-22 LAB — D-DIMER, QUANTITATIVE (NOT AT ARMC): D DIMER QUANT: 1.32 ug{FEU}/mL — AB (ref 0.00–0.48)

## 2014-03-22 LAB — TROPONIN I

## 2014-03-22 NOTE — ED Provider Notes (Signed)
CSN: [id number]     Arrival date & time 03/22/14  1540 History   First MD Initiated Contact with Patient 03/22/14 [phone]     Chief Complaint  Patient presents with  . Fall     (Consider location/radiation/quality/duration/timing/severity/associated sxs/prior Treatment) HPI 53 year old female with history of chronic back pain and chronic left leg pain chronic chest pain states she thinks she fell sometime in the last week and hit her head with headache and neck pain since that unknown time with amnesia for the event, states had quadruple vision out of her left eye for the last few days stating when she would look out of her left eye she was seeing four objects instead of 1, but her left eye no longer is seeing four objects and today she is seeing only one object today out of her left eye, she has had normal vision out of her right eye, she is no change in speech no lateralizing weakness or numbness or incoordination, she has baseline unchanged chronic severe back pain, she has had severe chest pain for months if not longer unchanged, she has chronic left leg pain for an unknown period of time of her entire leg without swelling or deformity but patient thinks she might of fallen sometime in the last several months but she's not sure and has had worse left leg pain probably for several months but she is not sure how long its been worse, all of her pains are severe worse with palpation and movement constant at the present for days if not months or years as far as her back chest and leg pain is concerned. She thinks her headache and neck pain of only been present for several days and she does not remember falling but thinks she might of fallen because she has tenderness to the left side of her for head and did have a bump there or she thinks she must have fallen this week. Past Medical History  Diagnosis Date  . Allergic rhinitis   . Anxiety and depression   . Osteoarthritis   . PUD (peptic ulcer disease)    . Gout   . DJD (degenerative joint disease) of lumbar spine   . Chronic back pain   . GI bleed   . Chronic pain syndrome   . Cervical cancer    Past Surgical History  Procedure Laterality Date  . Total abdominal hysterectomy    . Cervival cone    . Tubal ligation    . Appendectomy    . Throat surgery    . I&d extremity Right 08/04/2013    Procedure: IRRIGATION AND DEBRIDEMENT RIGHT HAND AND FOREARM;  Surgeon: Roseanne Kaufman, MD;  Location: [place];  Service: Orthopedics;  Laterality: Right;  . Fasciotomy Right 08/04/2013    Procedure: FASCIOTOMY;  Surgeon: Roseanne Kaufman, MD;  Location: [place];  Service: Orthopedics;  Laterality: Right;  . Application of wound vac Right 08/04/2013    Procedure: APPLICATION OF WOUND VAC;  Surgeon: Roseanne Kaufman, MD;  Location: [place];  Service: Orthopedics;  Laterality: Right;  . I&d extremity Right 08/05/2013    Procedure: IRRIGATION AND DEBRIDEMENT RIGHT HAND AND FOREARM with SECONDARY CLOSURE ;  Surgeon: Roseanne Kaufman, MD;  Location: [place];  Service: Orthopedics;  Laterality: Right;  . I&d extremity Right 08/07/2013    Procedure: IRRIGATION AND DEBRIDEMENT RIGHT ARM, FOREARM, HAND AND SKIN GRAFTING AS NECESSARY TO AFECTED AREAS (FOREARM AND HAND );  Surgeon: Roseanne Kaufman, MD;  Location: [place];  Service: Orthopedics;  Laterality: Right;  graft from right thigh to arm  . Skin full thickness graft Right 08/07/2013    Procedure: AND SKIN GRAFTING AS NECESSARY TO AFECTED AREAS (FOREARM AND HAND );  Surgeon: Roseanne Kaufman, MD;  Location: [place];  Service: Orthopedics;  Laterality: Right;  skin graft from right thigh   Family History  Problem Relation Age of Onset  . Coronary artery disease Father   . Hypertension Father   . Diabetes type II Mother   . Alcohol abuse Mother    History  Substance Use Topics  . Smoking status: Current Every Day Smoker -- 0.50 packs/day for 40 years    Types: Cigarettes  . Smokeless tobacco: Never Used  . Alcohol Use: No    OB History    No data available     Review of Systems 10 Systems reviewed and are negative for acute change except as noted in the HPI.   Allergies  Asa and Vicodin  Home Medications   Prior to Admission medications   Medication Sig Start Date End Date Taking? Authorizing Provider  ALPRAZolam Duanne Moron) 1 MG tablet Take 1 tablet (1 mg total) by mouth 2 (two) times daily as needed for anxiety. 08/10/13  Yes Ivan Croft, PA-C  CYMBALTA 60 MG capsule Take 60 mg by mouth at bedtime. 03/19/12  Yes Historical Provider, MD  gabapentin (NEURONTIN) 300 MG capsule Take 1 capsule by mouth 3 (three) times daily. 02/18/14  Yes Historical Provider, MD  nitroGLYCERIN (NITROSTAT) 0.3 MG SL tablet Place 0.3 mg under the tongue every 5 (five) minutes as needed for chest pain.   Yes Historical Provider, MD  oxycodone (ROXICODONE) 30 MG immediate release tablet Take 30 mg by mouth 4 (four) times daily.    Yes Historical Provider, MD  pantoprazole (PROTONIX) 40 MG tablet Take 40 mg by mouth 2 (two) times daily.   Yes Historical Provider, MD  tiZANidine (ZANAFLEX) 4 MG tablet Take 1 tablet by mouth 3 (three) times daily as needed for muscle spasms.  01/24/14  Yes Historical Provider, MD  triamterene-hydrochlorothiazide (MAXZIDE-25) 37.5-25 MG per tablet Take 1 tablet by mouth daily as needed (Takes if blood pressure is high).  04/08/12  Yes Historical Provider, MD  cephALEXin (KEFLEX) 500 MG capsule Take 1 capsule (500 mg total) by mouth 4 (four) times daily. Patient not taking: Reported on 03/22/2014 08/10/13   Ivan Croft, PA-C  cyclobenzaprine (FLEXERIL) 10 MG tablet Take 1 tablet (10 mg total) by mouth 3 (three) times daily as needed for muscle spasms. Patient not taking: Reported on 03/22/2014 08/10/13   Ivan Croft, PA-C  oxycodone (ROXICODONE) 30 MG immediate release tablet Take 1 tablet (30 mg total) by mouth every 6 (six) hours as needed for pain. Patient not taking: Reported on 03/22/2014  08/10/13   Ivan Croft, PA-C   BP 126/85 mmHg  Pulse 73  Temp(Src) 97.9 F (36.6 C) (Oral)  Resp 15  Ht 5\' 4"  (1.626 m)  Wt 175 lb (79.379 kg)  BMI 30.02 kg/m2  SpO2 97%  LMP 09/16/2000 Physical Exam  Constitutional: She is oriented to person, place, and time.  Awake, alert, nontoxic appearance with baseline speech for patient.  HENT:  Mouth/Throat: No oropharyngeal exudate.  Tenderness to the left eyebrow region  Eyes: EOM are normal. Pupils are equal, round, and reactive to light. Right eye exhibits no discharge. Left eye exhibits no discharge.  Neck: Neck supple.  Cervical spine and posterior neck mild  diffuse tenderness  Cardiovascular: Normal rate and regular rhythm.   No murmur heard. Pulmonary/Chest: Effort normal and breath sounds normal. No stridor. No respiratory distress. She has no wheezes. She has no rales. She exhibits tenderness.  Reproducible bilateral and anterior chest wall tenderness  Abdominal: Soft. Bowel sounds are normal. She exhibits no mass. There is no tenderness. There is no rebound.  Musculoskeletal: She exhibits tenderness. She exhibits no edema.  Baseline ROM, moves extremities with no obvious new focal weakness. Diffuse back tenderness diffuse tenderness to entire left leg. Both legs have normal light touch with intact reflexes knee jerk and ankle jerk bilaterally with dorsalis pedis pulses intact bilaterally normal light touch and 5 out of 5 strength for quadriceps hamstrings extensor pollicis longus foot dorsiflexion foot plantar flexion with gait antalgic but not ataxic; no swelling of left leg compared to the right leg; patient has minimal diffuse tenderness to entire left leg including hip thigh knee calf ankle and foot which has been present for an unknown chronic period of time.  Lymphadenopathy:    She has no cervical adenopathy.  Neurological: She is alert and oriented to person, place, and time.  Awake, alert, cooperative and aware of  situation; motor strength bilaterally; sensation normal to light touch bilaterally; peripheral visual fields full to confrontation; no facial asymmetry; tongue midline; major cranial nerves appear intact; no pronator drift, normal finger to nose bilaterally, baseline gait without new ataxia.  Skin: No rash noted.  Psychiatric: She has a normal mood and affect.  Nursing note and vitals reviewed.   ED Course  Procedures (including critical care time) Labs Review Labs Reviewed  D-DIMER, QUANTITATIVE - Abnormal; Notable for the following:    D-Dimer, Quant 1.32 (*)    All other components within normal limits  BASIC METABOLIC PANEL - Abnormal; Notable for the following:    Potassium 3.2 (*)    Chloride 95 (*)    Glucose, Bld 105 (*)    GFR calc non Af Amer 78 (*)    All other components within normal limits  CBC WITH DIFFERENTIAL - Abnormal; Notable for the following:    RBC 5.32 (*)    Hemoglobin 16.3 (*)    HCT 46.2 (*)    All other components within normal limits  TROPONIN I    Imaging Review Dg Chest 2 View  03/22/2014   CLINICAL DATA:  Pain under breasts for several months.  EXAM: CHEST  2 VIEW  COMPARISON:  Chest radiograph 12/20/2013  FINDINGS: The heart size and mediastinal contours are within normal limits. Both lungs are clear. The visualized skeletal structures are unremarkable.  IMPRESSION: No active cardiopulmonary disease.   Electronically Signed   By: Curlene Dolphin M.D.   On: 03/22/2014 10:29   Ct Head Wo Contrast  03/22/2014   CLINICAL DATA:  Diplopia. Headache. Light and sound sensitivity. Fell 1 week ago.  EXAM: CT HEAD WITHOUT CONTRAST  CT CERVICAL SPINE WITHOUT CONTRAST  TECHNIQUE: Multidetector CT imaging of the head and cervical spine was performed following the standard protocol without intravenous contrast. Multiplanar CT image reconstructions of the cervical spine were also generated.  COMPARISON:  07/11/2013.  FINDINGS: CT HEAD FINDINGS  Diffusely enlarged  ventricles and subarachnoid spaces. No skull fracture, intracranial hemorrhage or paranasal sinus air-fluid levels. No mass or CT evidence of acute infarction.  CT CERVICAL SPINE FINDINGS  Mild anterior spur formation at the C4-5, C5-6 and C6-7 levels. No prevertebral soft tissue swelling, fractures or subluxations. The previously  seen bilateral lung opacity is no longer demonstrated.  IMPRESSION: 1. No acute abnormality. 2. Stable mild diffuse cerebral atrophy. 3. No skull fracture, intracranial hemorrhage, cervical spine fracture or subluxation.   Electronically Signed   By: Enrique Sack M.D.   On: 03/22/2014 10:41   Ct Cervical Spine Wo Contrast  03/22/2014   CLINICAL DATA:  Diplopia. Headache. Light and sound sensitivity. Fell 1 week ago.  EXAM: CT HEAD WITHOUT CONTRAST  CT CERVICAL SPINE WITHOUT CONTRAST  TECHNIQUE: Multidetector CT imaging of the head and cervical spine was performed following the standard protocol without intravenous contrast. Multiplanar CT image reconstructions of the cervical spine were also generated.  COMPARISON:  07/11/2013.  FINDINGS: CT HEAD FINDINGS  Diffusely enlarged ventricles and subarachnoid spaces. No skull fracture, intracranial hemorrhage or paranasal sinus air-fluid levels. No mass or CT evidence of acute infarction.  CT CERVICAL SPINE FINDINGS  Mild anterior spur formation at the C4-5, C5-6 and C6-7 levels. No prevertebral soft tissue swelling, fractures or subluxations. The previously seen bilateral lung opacity is no longer demonstrated.  IMPRESSION: 1. No acute abnormality. 2. Stable mild diffuse cerebral atrophy. 3. No skull fracture, intracranial hemorrhage, cervical spine fracture or subluxation.   Electronically Signed   By: Enrique Sack M.D.   On: 03/22/2014 10:41   US Venous Img Lower Unilateral Left  03/22/2014   CLINICAL DATA:  Left leg pain  EXAM: Left LOWER EXTREMITY VENOUS DOPPLER ULTRASOUND  TECHNIQUE: Gray-scale sonography with graded compression, as  well as color Doppler and duplex ultrasound were performed to evaluate the lower extremity deep venous systems from the level of the common femoral vein and including the common femoral, femoral, profunda femoral, popliteal and calf veins including the posterior tibial, peroneal and gastrocnemius veins when visible. The superficial great saphenous vein was also interrogated. Spectral Doppler was utilized to evaluate flow at rest and with distal augmentation maneuvers in the common femoral, femoral and popliteal veins.  COMPARISON:  None.  FINDINGS: Contralateral Common Femoral Vein: Respiratory phasicity is normal and symmetric with the symptomatic side. No evidence of thrombus. Normal compressibility.  Common Femoral Vein: No evidence of thrombus. Normal compressibility, respiratory phasicity and response to augmentation.  Saphenofemoral Junction: No evidence of thrombus. Normal compressibility and flow on color Doppler imaging.  Profunda Femoral Vein: No evidence of thrombus. Normal compressibility and flow on color Doppler imaging.  Femoral Vein: No evidence of thrombus. Normal compressibility, respiratory phasicity and response to augmentation.  Popliteal Vein: No evidence of thrombus. Normal compressibility, respiratory phasicity and response to augmentation.  Calf Veins: No evidence of thrombus. Normal compressibility and flow on color Doppler imaging.  Superficial Great Saphenous Vein: No evidence of thrombus. Normal compressibility and flow on color Doppler imaging.  Venous Reflux:  None.  Other Findings:  None.  IMPRESSION: No evidence of deep venous thrombosis.   Electronically Signed   By: Inez Catalina M.D.   On: 03/22/2014 13:09     EKG Interpretation   Date/Time:  Friday March 22 2014 09:19:03 EST Ventricular Rate:  82 PR Interval:  150 QRS Duration: 110 QT Interval:  424 QTC Calculation: 495 R Axis:   86 Text Interpretation:  Sinus rhythm Nonspecific T abnrm, anterolateral  leads  Borderline prolonged QT interval Baseline wander in lead(s) V5 No  significant change since last tracing Confirmed by [facility]  MD, Jenny Reichmann  [phone]) on 03/22/2014 9:37:41 AM      MDM   Final diagnoses:  Fall  Head injury  Headache  Amnesia  Neck pain  Chest pain  Chronic chest pain  Leg pain, diffuse, left    Pt eloped after [place].    Babette Relic, MD 03/22/14 [phone]

## 2014-03-22 NOTE — ED Notes (Signed)
Pt requested to leave. Pt did not appear in distress. When asked why she wanted to leave before the results of her [place] she stated that she "does not believe that he (doctor) will find any blood clots" and that she will call the ED for the results. Pt denied signing any paperwork.

## 2014-03-22 NOTE — ED Notes (Signed)
Pt reports sitting at her table at home and falling. Pt unable to determine time and date when she fell upon asking. Pt denies loosing conciousness. Pt reports left groin pain.

## 2014-03-22 NOTE — ED Notes (Signed)
Contacted [place] tech who stated she just completed a inpatient order and will be her to do a portable in 5 minutes.

## 2014-03-22 NOTE — ED Notes (Addendum)
Pt reports fell x1 week ago. Pt reports double vision,headache, LLE pain, and left rib pain ever since fall. Pt reports light and sound sensitivity. nad noted. Pt alert and oriented. Speech clear. Airway patent.

## 2014-05-29 ENCOUNTER — Other Ambulatory Visit (HOSPITAL_COMMUNITY): Payer: Self-pay | Admitting: Family Medicine

## 2014-05-29 ENCOUNTER — Ambulatory Visit (HOSPITAL_COMMUNITY)
Admission: RE | Admit: 2014-05-29 | Discharge: 2014-05-29 | Disposition: A | Discharge status: Home / Self Care | Payer: Medicaid Other | Source: Ambulatory Visit | Attending: Family Medicine | Admitting: Family Medicine

## 2014-05-29 DIAGNOSIS — G8929 Other chronic pain: Secondary | ICD-10-CM

## 2014-05-29 DIAGNOSIS — M79606 Pain in leg, unspecified: Secondary | ICD-10-CM | POA: Diagnosis not present

## 2014-07-25 ENCOUNTER — Other Ambulatory Visit (HOSPITAL_COMMUNITY): Payer: Self-pay | Admitting: Family Medicine

## 2014-07-25 ENCOUNTER — Ambulatory Visit (HOSPITAL_COMMUNITY)
Admission: RE | Admit: 2014-07-25 | Discharge: 2014-07-25 | Disposition: A | Discharge status: Home / Self Care | Payer: Medicaid Other | Source: Ambulatory Visit | Attending: Family Medicine | Admitting: Family Medicine

## 2014-07-25 DIAGNOSIS — W19XXXD Unspecified fall, subsequent encounter: Secondary | ICD-10-CM

## 2014-07-25 DIAGNOSIS — M25511 Pain in right shoulder: Secondary | ICD-10-CM | POA: Insufficient documentation

## 2014-07-25 DIAGNOSIS — M25522 Pain in left elbow: Secondary | ICD-10-CM

## 2014-07-25 DIAGNOSIS — W19XXXA Unspecified fall, initial encounter: Secondary | ICD-10-CM | POA: Insufficient documentation

## 2014-07-25 DIAGNOSIS — M19011 Primary osteoarthritis, right shoulder: Secondary | ICD-10-CM | POA: Insufficient documentation

## 2014-10-07 ENCOUNTER — Ambulatory Visit (HOSPITAL_COMMUNITY)
Admission: RE | Admit: 2014-10-07 | Discharge: 2014-10-07 | Disposition: A | Discharge status: Home / Self Care | Payer: Medicaid Other | Source: Ambulatory Visit | Attending: Family Medicine | Admitting: Family Medicine

## 2014-10-07 ENCOUNTER — Other Ambulatory Visit (HOSPITAL_COMMUNITY): Payer: Self-pay | Admitting: Family Medicine

## 2014-10-07 DIAGNOSIS — M25562 Pain in left knee: Secondary | ICD-10-CM | POA: Diagnosis not present

## 2014-10-29 ENCOUNTER — Other Ambulatory Visit (HOSPITAL_COMMUNITY): Payer: Self-pay | Admitting: Family Medicine

## 2014-10-29 DIAGNOSIS — R87629 Unspecified abnormal cytological findings in specimens from vagina: Secondary | ICD-10-CM

## 2014-10-29 DIAGNOSIS — Z1231 Encounter for screening mammogram for malignant neoplasm of breast: Secondary | ICD-10-CM

## 2014-10-29 HISTORY — DX: Unspecified abnormal cytological findings in specimens from vagina: R87.629

## 2014-12-05 ENCOUNTER — Ambulatory Visit (HOSPITAL_COMMUNITY)
Admission: RE | Admit: 2014-12-05 | Discharge: 2014-12-05 | Disposition: A | Discharge status: Home / Self Care | Payer: Medicaid Other | Source: Ambulatory Visit | Attending: Family Medicine | Admitting: Family Medicine

## 2014-12-05 ENCOUNTER — Other Ambulatory Visit (HOSPITAL_COMMUNITY): Payer: Self-pay | Admitting: Family Medicine

## 2014-12-05 DIAGNOSIS — G8929 Other chronic pain: Secondary | ICD-10-CM | POA: Diagnosis not present

## 2014-12-05 DIAGNOSIS — Z87891 Personal history of nicotine dependence: Secondary | ICD-10-CM

## 2014-12-05 DIAGNOSIS — J449 Chronic obstructive pulmonary disease, unspecified: Secondary | ICD-10-CM | POA: Insufficient documentation

## 2014-12-05 DIAGNOSIS — M542 Cervicalgia: Secondary | ICD-10-CM | POA: Diagnosis not present

## 2014-12-05 DIAGNOSIS — F17200 Nicotine dependence, unspecified, uncomplicated: Secondary | ICD-10-CM | POA: Insufficient documentation

## 2014-12-05 DIAGNOSIS — R0989 Other specified symptoms and signs involving the circulatory and respiratory systems: Secondary | ICD-10-CM | POA: Diagnosis not present

## 2014-12-05 DIAGNOSIS — Z1231 Encounter for screening mammogram for malignant neoplasm of breast: Secondary | ICD-10-CM | POA: Diagnosis not present

## 2014-12-05 DIAGNOSIS — M2578 Osteophyte, vertebrae: Secondary | ICD-10-CM | POA: Diagnosis not present

## 2014-12-09 ENCOUNTER — Ambulatory Visit (HOSPITAL_COMMUNITY): Payer: Medicaid Other

## 2014-12-17 ENCOUNTER — Encounter: Payer: Self-pay | Admitting: *Deleted

## 2014-12-18 ENCOUNTER — Ambulatory Visit (INDEPENDENT_AMBULATORY_CARE_PROVIDER_SITE_OTHER): Payer: Medicaid Other | Admitting: Obstetrics and Gynecology

## 2014-12-18 ENCOUNTER — Encounter: Payer: Self-pay | Admitting: Obstetrics and Gynecology

## 2014-12-18 VITALS — BP 120/80 | Ht 64.0 in | Wt 195.0 lb

## 2014-12-18 DIAGNOSIS — Z8541 Personal history of malignant neoplasm of cervix uteri: Secondary | ICD-10-CM

## 2014-12-18 DIAGNOSIS — R87610 Atypical squamous cells of undetermined significance on cytologic smear of cervix (ASC-US): Secondary | ICD-10-CM

## 2014-12-18 MED ORDER — ESTRADIOL 0.1 MG/GM VA CREA: 5.0000 g | TOPICAL_CREAM | Freq: Every day | VAGINAL | Status: DC

## 2014-12-18 NOTE — Progress Notes (Signed)
Patient ID: [name], female   DOB: [birth year], 54 y.o.   MRN: [id number] Pt here today for consult. Pt has had a hysterectomy but has a history of cervical cancer. Pt wants to discuss a recent pap smear and some other issues that she is having.

## 2014-12-18 NOTE — Progress Notes (Signed)
Patient ID: [name], female   DOB: 03-10-1961, 54 y.o.   MRN: [id number]   This chart was scribed for Jonnie Kind, MD by [name], ED Scribe. This patient was seen in room 2 and the patient's care was started at 11:47 AM.   [facility] Visit  Patient name: [name] MRN [id number]  Date of birth: [birth year]  CC & HPI:  [name] is a 54 y.o. female presenting today for follow up of abnormal pap smear. She is here today for a consult. She had a total abdominal hysterectomy and tubal ligation and has a h/o cervical cancer when she was in her 49s that was diagnosed through conization. She states that her pap smear showed abnormal cells and was told she needed to get a consult for colposcopy. She is not currently sexually active. Pt denies any hormone therapy. Pt notes she has no sex drive at this time but it is not concerning to her. She denies any other complaints   ROS:  No complaints  Pertinent History Reviewed:   Reviewed: Significant for  Medical         Past Medical History  Diagnosis Date  . Allergic rhinitis   . Anxiety and depression   . Osteoarthritis   . PUD (peptic ulcer disease)   . Gout   . DJD (degenerative joint disease) of lumbar spine   . Chronic back pain   . GI bleed   . Chronic pain syndrome   . Cervical cancer   . Vaginal Pap smear, abnormal 10-29-14    ASCUS negative HPV  . Fibromyalgia   . Hyperlipidemia                               Surgical Hx:    Past Surgical History  Procedure Laterality Date  . Total abdominal hysterectomy    . Cervival cone    . Tubal ligation    . Appendectomy    . Throat surgery    . I&d extremity Right 08/04/2013    Procedure: IRRIGATION AND DEBRIDEMENT RIGHT HAND AND FOREARM;  Surgeon: Roseanne Kaufman, MD;  Location: [facility] [place];  Service: Orthopedics;  Laterality: Right;  . Fasciotomy Right 08/04/2013    Procedure: FASCIOTOMY;  Surgeon: Roseanne Kaufman, MD;  Location: [facility];  Service:  Orthopedics;  Laterality: Right;  . Application of wound vac Right 08/04/2013    Procedure: APPLICATION OF WOUND VAC;  Surgeon: Roseanne Kaufman, MD;  Location: [facility];  Service: Orthopedics;  Laterality: Right;  . I&d extremity Right 08/05/2013    Procedure: IRRIGATION AND DEBRIDEMENT RIGHT HAND AND FOREARM with SECONDARY CLOSURE ;  Surgeon: Roseanne Kaufman, MD;  Location: [facility];  Service: Orthopedics;  Laterality: Right;  . I&d extremity Right 08/07/2013    Procedure: IRRIGATION AND DEBRIDEMENT RIGHT ARM, FOREARM, HAND AND SKIN GRAFTING AS NECESSARY TO AFECTED AREAS (FOREARM AND HAND );  Surgeon: Roseanne Kaufman, MD;  Location: [facility];  Service: Orthopedics;  Laterality: Right;  graft from right thigh to arm  . Skin full thickness graft Right 08/07/2013    Procedure: AND SKIN GRAFTING AS NECESSARY TO AFECTED AREAS (FOREARM AND HAND );  Surgeon: Roseanne Kaufman, MD;  Location: [facility];  Service: Orthopedics;  Laterality: Right;  skin graft from right thigh   Medications: Reviewed & Updated - see associated section  Current outpatient prescriptions:  .  ALPRAZolam (XANAX) 1 MG tablet, Take 1 tablet (1 mg total) by mouth 2 (two) times daily as needed for anxiety., Disp: 60 tablet, Rfl: 0 .  BIOTIN 5000 PO, Take 2 tablets by mouth daily., Disp: , Rfl:  .  cyclobenzaprine (FLEXERIL) 10 MG tablet, Take 1 tablet (10 mg total) by mouth 3 (three) times daily as needed for muscle spasms., Disp: 90 tablet, Rfl: 0 .  folic acid (FOLVITE) 622 MCG tablet, Take 800 mcg by mouth daily., Disp: , Rfl:  .  furosemide (LASIX) 20 MG tablet, , Disp: , Rfl:  .  oxycodone (ROXICODONE) 30 MG immediate release tablet, Take 30 mg by mouth 4 (four) times daily. , Disp: , Rfl:  .  pantoprazole (PROTONIX) 40 MG tablet, Take 40 mg by mouth 2 (two) times daily., Disp: , Rfl:  .  potassium chloride (K-DUR,KLOR-CON) 10 MEQ tablet, Take 10 mEq by mouth as needed (Pt takes when she needs it !!)., Disp: , Rfl:  .   simvastatin (ZOCOR) 20 MG tablet, , Disp: , Rfl:  .  tiZANidine (ZANAFLEX) 4 MG tablet, Take 1 tablet by mouth 3 (three) times daily as needed for muscle spasms. , Disp: , Rfl: 0 .  triamterene-hydrochlorothiazide (MAXZIDE-25) 37.5-25 MG per tablet, Take 1 tablet by mouth daily as needed (Takes if blood pressure is high). , Disp: , Rfl:    Social History: Reviewed -  reports that she has been smoking Cigarettes.  She has a 20 pack-year smoking history. She has never used smokeless tobacco.  Objective Findings:  Vitals: Blood pressure 120/80, height 5\' 4"  (1.626 m), weight 195 lb (88.451 kg), last menstrual period 09/16/2000.  Physical Examination: not done   Assessment & Plan:   A:  1. Abnormal Pap Smear  P:  1. Prescribe Estradoil vaginal cream 2. Will have pt follow up in 5 weeks for pap smear and colposcopy  I  I personally performed the services described in this documentation, which was SCRIBED in my presence. The recorded information has been reviewed and considered accurate. It has been edited as necessary during review. Jonnie Kind, MD

## 2014-12-26 ENCOUNTER — Telehealth: Payer: Self-pay | Admitting: Obstetrics and Gynecology

## 2014-12-29 ENCOUNTER — Other Ambulatory Visit: Payer: Self-pay | Admitting: Obstetrics and Gynecology

## 2015-01-01 ENCOUNTER — Other Ambulatory Visit: Payer: Self-pay | Admitting: Obstetrics and Gynecology

## 2015-01-01 DIAGNOSIS — N952 Postmenopausal atrophic vaginitis: Secondary | ICD-10-CM

## 2015-01-01 MED ORDER — ESTROGENS, CONJUGATED 0.625 MG/GM VA CREA: 1.0000 | TOPICAL_CREAM | Freq: Every day | VAGINAL | Status: DC

## 2015-01-01 NOTE — Telephone Encounter (Signed)
rx for premarin VC done.

## 2015-01-29 ENCOUNTER — Ambulatory Visit (INDEPENDENT_AMBULATORY_CARE_PROVIDER_SITE_OTHER): Payer: Medicaid Other | Admitting: Obstetrics and Gynecology

## 2015-01-29 ENCOUNTER — Encounter: Payer: Self-pay | Admitting: Obstetrics and Gynecology

## 2015-01-29 VITALS — BP 104/62 | Ht 64.0 in

## 2015-01-29 DIAGNOSIS — R87811 Vaginal high risk human papillomavirus (HPV) DNA test positive: Secondary | ICD-10-CM | POA: Diagnosis not present

## 2015-01-29 DIAGNOSIS — R87620 Atypical squamous cells of undetermined significance on cytologic smear of vagina (ASC-US): Secondary | ICD-10-CM

## 2015-01-29 MED ORDER — CEPHALEXIN 500 MG PO CAPS: 500.0000 mg | ORAL_CAPSULE | Freq: Four times a day (QID) | ORAL | Status: DC

## 2015-01-29 NOTE — Progress Notes (Signed)
By signing my name below, I, [name], attest that this documentation has been prepared under the direction and in the presence of Jonnie Kind, MD. Electronically Signed: [name], ED Scribe. 01/29/2015. 2:05 PM.   [name] 54 y.o. No obstetric history on file. here for colposcopy for atypical squamous cellularity of undetermined significance (ASCUS) pap smear in July 2016. Pt is s/p hysterectomy with a h/o cervical cancer from 30 yrs ago, but then she states hysterectomy was by me in 2007 Discussed role for HPV in cervical dysplasia, need for surveillance. Pt also has concerns of SINUS CONGESTION  And urgently requests Keflex. Pt is resistant to hearing the normalcy of ear and sinus exam. Patient given informed consent, signed copy in the chart, time out was performed.  Placed in lithotomy position. Cervix viewed with speculum and colposcope after application of acetic acid.   Colposcopy adequate? Yes  looked at upper vagina and vaginal cuff. Cervix has been surgically removed. Healthy vaginal tissues with estrogen affect present due to hormone cream. She has well healed upper vagina and no lesions noted no biopsies were obtained ECC specimen obtained: no   Colposcopy IMPRESSION: no visible dysplasia Plan:  Will NOT give antibiotic. If sx last 7 days will call in Keflex if pt contacts office.\            Repeat pap in 1 yr , after use of estrogen.   I personally performed the services described in this documentation, which was SCRIBED in my presence. The recorded information has been reviewed and considered accurate. It has been edited as necessary during review. Jonnie Kind, MD   .Moshe Salisbury

## 2015-01-29 NOTE — Progress Notes (Signed)
Patient ID: [name], female   DOB: Nov 28, 1960, 54 y.o.   MRN: [id number] Pt here today for colposcopy. Procedure explained and consent signed. Pt is S/P hysterectomy. Pt had abnormal pap in July of this year.

## 2015-01-29 NOTE — Patient Instructions (Signed)
Please call our office on day 8 if your congestion lasts to day 8. Please continue with your visit with dr Joya Gaskins.

## 2015-01-31 NOTE — Progress Notes (Signed)
cancelled

## 2015-03-20 IMAGING — US US BREAST LTD UNI RIGHT INC AXILLA
1 series · 5 of 5 positions shown · non-contrast
Comparison: 10/24/2012 and prior mammograms

CLINICAL DATA: 52-year-old female with right breast swelling and
history of injury. Patient states that thickening in the outer right
breast has decreased over the last month.

EXAM:
DIGITAL DIAGNOSTIC  RIGHT MAMMOGRAM WITH CAD
ULTRASOUND RIGHT BREAST

[Series 1: us breast ltd uni right inc axilla · 0.06mm/px · 5 of 5 slices shown]
[im 1/5]
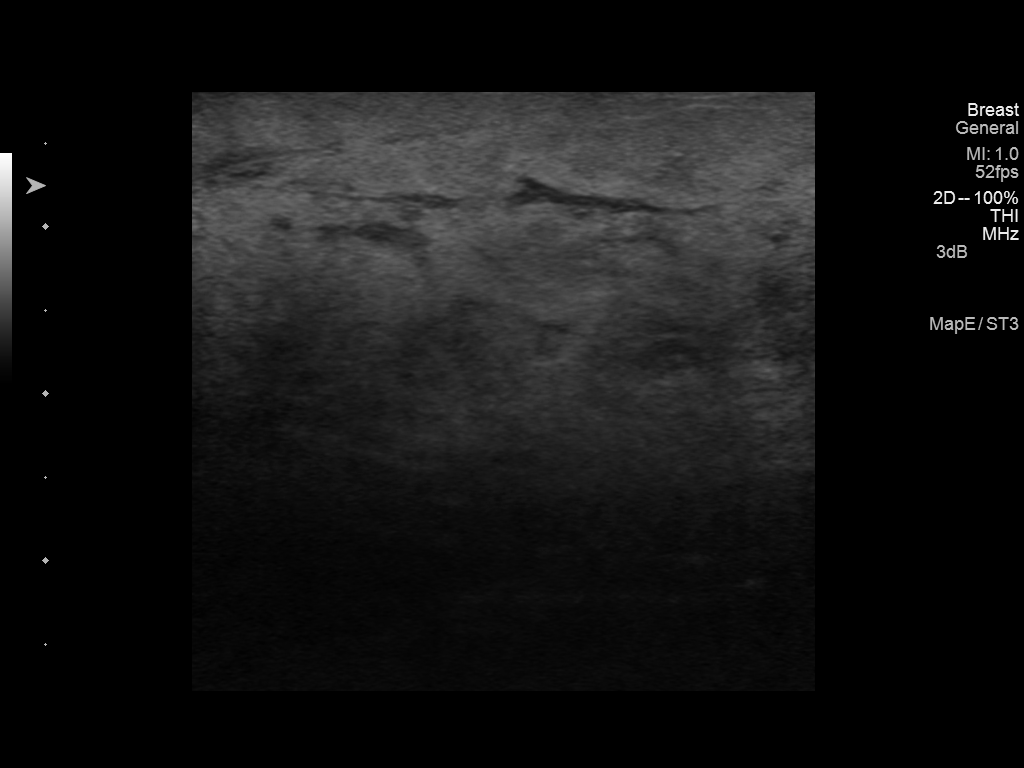
[im 2/5]
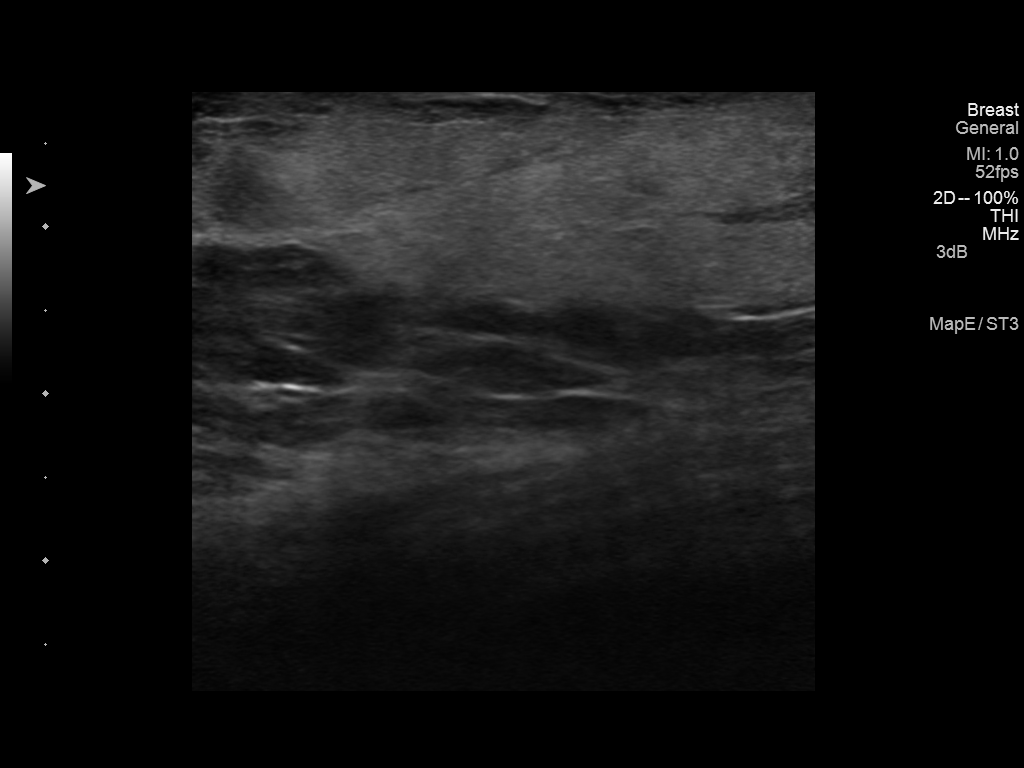
[im 3/5]
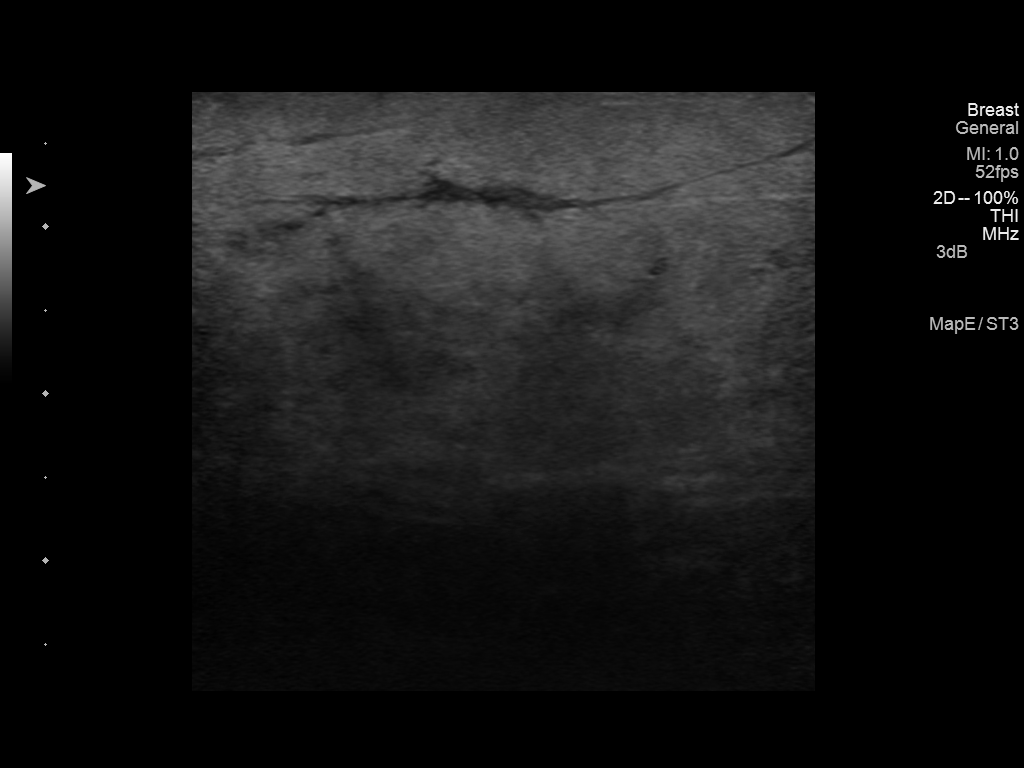
[im 4/5]
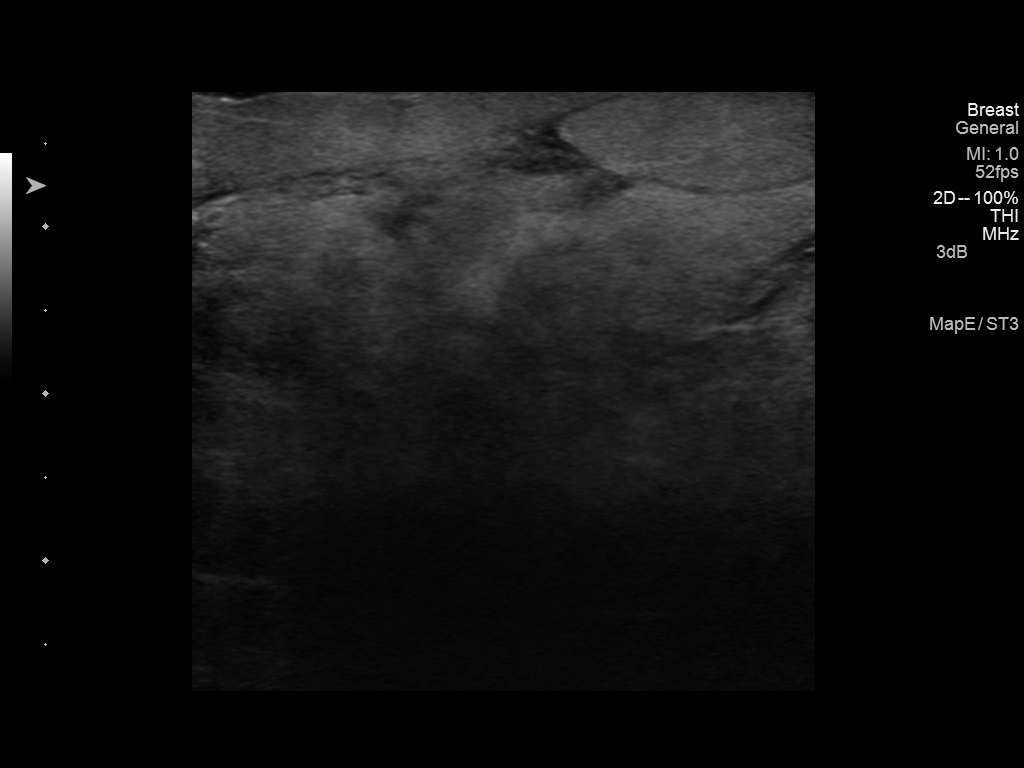
[im 5/5]
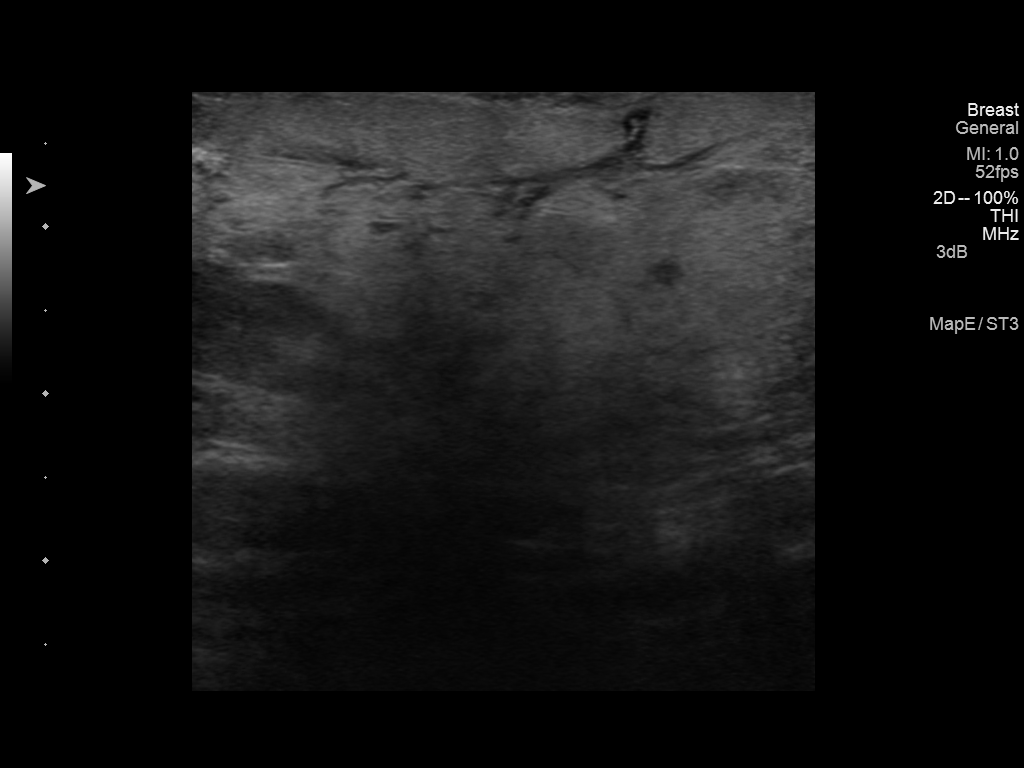

[5 of 5 positions shown; findings below may reference images not displayed]

ACR Breast Density Category b: There are scattered areas of
fibroglandular density.
FINDINGS: Diffuse increased density within the central and anterior right
breast is identified without discrete mass, distortion or worrisome
calcifications.

Mammographic images were processed with CAD.

On physical exam, no discrete palpable abnormalities are identified
within the outer right breast.

Ultrasound is performed, showing diffuse increased echogenicity in
the anterior aspect of the right breast with suggestion of edema.
There is no solid mass, distortion or worrisome areas of shadowing
identified within the right breast.
IMPRESSION: Probable posttraumatic changes within the right breast. No
suspicious mammographic, palpable or sonographic right breast
findings. Three month followup is recommended to ensure stability.

RECOMMENDATION:
Bilateral diagnostic mammograms with possible right breast
ultrasound in 3 months to resume annual mammogram schedule and to
reassess likely benign right breast findings.

I have discussed the findings and recommendations with the patient.
Results were also provided in writing at the conclusion of the
visit. If applicable, a reminder letter will be sent to the patient
regarding the next appointment.

BI-RADS CATEGORY  3: Probably benign.

## 2015-04-09 ENCOUNTER — Emergency Department (HOSPITAL_COMMUNITY): Payer: Medicaid Other

## 2015-04-09 ENCOUNTER — Emergency Department (HOSPITAL_COMMUNITY)
Admission: EM | Admit: 2015-04-09 | Discharge: 2015-04-09 | Disposition: A | Discharge status: Home / Self Care | Payer: Medicaid Other | Attending: Emergency Medicine | Admitting: Emergency Medicine

## 2015-04-09 ENCOUNTER — Encounter (HOSPITAL_COMMUNITY): Payer: Self-pay | Admitting: Emergency Medicine

## 2015-04-09 DIAGNOSIS — G894 Chronic pain syndrome: Secondary | ICD-10-CM | POA: Insufficient documentation

## 2015-04-09 DIAGNOSIS — Z8711 Personal history of peptic ulcer disease: Secondary | ICD-10-CM | POA: Diagnosis not present

## 2015-04-09 DIAGNOSIS — Z8541 Personal history of malignant neoplasm of cervix uteri: Secondary | ICD-10-CM | POA: Insufficient documentation

## 2015-04-09 DIAGNOSIS — Z8709 Personal history of other diseases of the respiratory system: Secondary | ICD-10-CM | POA: Diagnosis not present

## 2015-04-09 DIAGNOSIS — F17210 Nicotine dependence, cigarettes, uncomplicated: Secondary | ICD-10-CM | POA: Insufficient documentation

## 2015-04-09 DIAGNOSIS — E785 Hyperlipidemia, unspecified: Secondary | ICD-10-CM | POA: Diagnosis not present

## 2015-04-09 DIAGNOSIS — R05 Cough: Secondary | ICD-10-CM | POA: Diagnosis not present

## 2015-04-09 DIAGNOSIS — Z8739 Personal history of other diseases of the musculoskeletal system and connective tissue: Secondary | ICD-10-CM | POA: Insufficient documentation

## 2015-04-09 DIAGNOSIS — K029 Dental caries, unspecified: Secondary | ICD-10-CM

## 2015-04-09 DIAGNOSIS — R0789 Other chest pain: Secondary | ICD-10-CM

## 2015-04-09 DIAGNOSIS — Z79899 Other long term (current) drug therapy: Secondary | ICD-10-CM | POA: Insufficient documentation

## 2015-04-09 DIAGNOSIS — R079 Chest pain, unspecified: Secondary | ICD-10-CM | POA: Diagnosis present

## 2015-04-09 HISTORY — DX: Other chest pain: R07.89

## 2015-04-09 HISTORY — DX: Pain, unspecified: R52

## 2015-04-09 HISTORY — DX: Other chronic pain: G89.29

## 2015-04-09 LAB — BASIC METABOLIC PANEL
ANION GAP: 9 (ref 5–15)
BUN: 11 mg/dL (ref 6–20)
CO2: 27 mmol/L (ref 22–32)
Calcium: 9 mg/dL (ref 8.9–10.3)
Chloride: 101 mmol/L (ref 101–111)
Creatinine, Ser: 0.8 mg/dL (ref 0.44–1.00)
GFR calc Af Amer: >60 mL/min (ref >60)
GFR calc non Af Amer: >60 mL/min (ref >60)
Glucose, Bld: 110 mg/dL — ABNORMAL HIGH (ref 65–99)
POTASSIUM: 3.8 mmol/L (ref 3.5–5.1)
SODIUM: 137 mmol/L (ref 135–145)

## 2015-04-09 LAB — CBC WITH DIFFERENTIAL/PLATELET
Basophils Absolute: 0.1 10*3/uL (ref 0.0–0.1)
Basophils Relative: 1 %
EOS PCT: 5 %
Eosinophils Absolute: 0.5 10*3/uL (ref 0.0–0.7)
HEMATOCRIT: 40.3 % (ref 36.0–46.0)
HEMOGLOBIN: 13.7 g/dL (ref 12.0–15.0)
LYMPHS ABS: 3.4 10*3/uL (ref 0.7–4.0)
Lymphocytes Relative: 35 %
MCH: 30.7 pg (ref 26.0–34.0)
MCHC: 34 g/dL (ref 30.0–36.0)
MCV: 90.4 fL (ref 78.0–100.0)
Monocytes Absolute: 0.5 10*3/uL (ref 0.1–1.0)
Monocytes Relative: 5 %
Neutro Abs: 5.2 10*3/uL (ref 1.7–7.7)
Neutrophils Relative %: 54 %
Platelets: 291 10*3/uL (ref 150–400)
RBC: 4.46 MIL/uL (ref 3.87–5.11)
RDW: 14.7 % (ref 11.5–15.5)
WBC: 9.6 10*3/uL (ref 4.0–10.5)

## 2015-04-09 LAB — TROPONIN I: Troponin I: <0.03 ng/mL

## 2015-04-09 LAB — D-DIMER, QUANTITATIVE: D-Dimer, Quant: 0.43 ug{FEU}/mL (ref 0.00–0.50)

## 2015-04-09 MED ORDER — PENICILLIN V POTASSIUM 250 MG PO TABS: 250.0000 mg | ORAL_TABLET | Freq: Four times a day (QID) | ORAL | Status: DC

## 2015-04-09 MED ORDER — METHOCARBAMOL 500 MG PO TABS: 1000.0000 mg | ORAL_TABLET | Freq: Four times a day (QID) | ORAL | Status: DC | PRN

## 2015-04-09 MED ORDER — IPRATROPIUM-ALBUTEROL 0.5-2.5 (3) MG/3ML IN SOLN
3.0000 mL | Freq: Once | RESPIRATORY_TRACT | Status: AC
  Administered 2015-04-09: 3 mL via RESPIRATORY_TRACT
  Filled 2015-04-09: qty 3

## 2015-04-09 NOTE — Discharge Instructions (Signed)
°Emergency Department Resource Guide °1) Find a Doctor and Pay Out of Pocket °Although you won't have to find out who is covered by your insurance plan, it is a good idea to ask around and get recommendations. You will then need to call the office and see if the doctor you have chosen will accept you as a new patient and what types of options they offer for patients who are self-pay. Some doctors offer discounts or will set up payment plans for their patients who do not have insurance, but you will need to ask so you aren't surprised when you get to your appointment. ° °2) Contact Your Local Health Department °Not all health departments have doctors that can see patients for sick visits, but many do, so it is worth a call to see if yours does. If you don't know where your local health department is, you can check in your phone book. The CDC also has a tool to help you locate your state's health department, and many state websites also have listings of all of their local health departments. ° °3) Find a Walk-in Clinic °If your illness is not likely to be very severe or complicated, you may want to try a walk in clinic. These are popping up all over the country in pharmacies, drugstores, and shopping centers. They're usually staffed by nurse practitioners or physician assistants that have been trained to treat common illnesses and complaints. They're usually fairly quick and inexpensive. However, if you have serious medical issues or chronic medical problems, these are probably not your best option. ° °No Primary Care Doctor: °- Call [facility] at  832-8000 - they can help you locate a primary care doctor that  accepts your insurance, provides certain services, etc. °- Physician Referral Service- 1-800-533-3463 ° °Chronic Pain Problems: °Organization         Address  Phone   Notes  ° [facility]  (336) 297-2271 Patients need to be referred by their primary care doctor.  ° °Medication  Assistance: °Organization         Address  Phone   Notes  °[place] [facility] [address] °[place], [place] [address] (336) 641-8030 --Must be a resident of [place] °-- Must have NO insurance coverage whatsoever (no Medicaid/ Medicare, etc.) °-- The pt. MUST have a primary care doctor that directs their care regularly and follows them in the community °  °[facility]  (866) 331-1348   °[employer]  (888) 892-1162   ° °Agencies that provide inexpensive medical care: °Organization         Address  Phone   Notes  °[facility]  (336) 832-8035   °[facility]    (336) 832-7272   °[facility] [address] °[place], [place] [address] (336) 832-4777   °[facility] [address], °[place] (336) 271-4999   °[facility]    (336) 373-0678   °[facility]    (336) 272-1050   °[facility] ° [address], [place] Phone:  (336) 832-4444, Fax:  (336) 832-4440 Hours of Operation:  9 am - 6 pm, M-F.  Also accepts Medicaid/Medicare and self-pay.  °[facility] ° [address], [place] Phone: (336) 832-3150, Fax: (336) 832-3151. Hours of Operation:  8:30 am - 5:30 pm, M-F.  Also accepts Medicaid and self-pay.  °[facility] [place] [address], [place] Phone: (336) 878-6027   °[facility] [address], [place], [place] (336)723-1848, Ext. 123 Mondays & Thursdays: 7-9 AM.  First 15 patients are seen on a first come, first serve basis. °  ° °Medicaid-accepting [place] Providers: ° °Organization         Address  Phone   Notes  °[facility] [address], [place] (336) 641-2100 Also accepts self-pay patients.  °[facility] [address], [place] ° (336) 856-9996   °[facility] [address], [place]  (336) 288-8857   °[facility] [address] [place] [address], [place] (336) 299-7000   °Veita Bland [address], [place]  ° (336) 373-1557 Only accepts [place] Access Medicaid patients after they have their name applied to their card.  ° °Self-Pay (no insurance) in [place]: ° °Organization         Address  Phone   Notes  °Sickle Cell Patients, [facility] [address], [place] (336) 832-1970   °[facility] [address], [place] (336) 832-4400   °[facility] ° [address], [place] (336) 992-4800   °[facility]/Dr. Osei-Bonsu ° [address] [place] [address], [place] or [address], [place] (336) 841-8500 Phone number for both [place] and [place] locations is the same.  °[facility] [address], [place] (336) 299-0000   °[facility] [address] [place] [address], [place] or [address] (336) 852-7530 °(336) 878-2260   °[facility] [address], [place] (336) 350-1642, phone; (336) 294-5005, fax Sees patients 1st and 3rd Saturday of every month.  Must not qualify for public or private insurance (i.e. Medicaid, Medicare, [place] Health Choice, Veterans' Benefits) • Household income should be no more than 200% of the poverty level •The clinic cannot treat you if you are pregnant or think you are pregnant • Sexually transmitted diseases are not treated at the clinic.  ° ° °Dental Care: °Organization         Address  Phone  Notes  °[place] [facility] [address],  (336) 641-6152 Accepts children up to age 21 who are enrolled in Medicaid or [place] Health Choice; pregnant women with a Medicaid card; and children who have applied for Medicaid or [place] Health Choice, but were declined, whose parents can pay a reduced fee at time of service.  °[place]  [facility] [place]  [address], [place] (336) 641-7733 Accepts children up to age 21 who are enrolled in Medicaid or [place] Health Choice; pregnant women with a Medicaid card; and children who have applied for Medicaid or [place] Health Choice, but were declined, whose parents can pay a reduced fee at time of service.  °[facility] ° [address],  (336) 641-4533 Patients are seen by appointment only. Walk-ins are not accepted. [facility] will see patients 18 years of age and older. °Monday - Tuesday (8am-5pm) °Most Wednesdays (8:30-5pm) °$30 per visit, cash only  °[facility] ° [address], [place] (336) 641-4533 Patients are seen by appointment only. Walk-ins are not accepted. [facility] will see patients 18 years of age and older. °One   Wednesday Evening (Monthly: Volunteer Based).  $30 per visit, cash only  °[facility]  (919) 537-3737 for adults; Children under age 4, call Graduate Pediatric Dentistry at (919) [phone]. Children aged 4-14, please call (919) [phone] to request a pediatric application. ° Dental services are provided in all areas of dental care including fillings, crowns and bridges, complete and partial dentures, implants, gum treatment, root canals, and extractions. Preventive care is also provided. Treatment is provided to both adults and children. °Patients are selected via a lottery and there is often a waiting list. °  °[facility] [address], °[place] ° (336) 763-8833 [email or web address] °  °[facility] [address], [place], [place] (336)723-1848, Ext. 123 Second and Fourth Thursday of each month, opens at 6:30 AM; Clinic ends at 9 AM.  Patients are seen on a first-come first-served basis, and a limited number are seen during each clinic.  ° °[facility] ° [address], [place], [place] (336) 723-7904    Eligibility Requirements °You must have lived in [place], [place], or [place] counties for at least the last three months. °  You cannot be eligible for state or federal sponsored healthcare insurance, including Veterans Administration, Medicaid, or Medicare. °  You generally cannot be eligible for healthcare insurance through your employer.  °  How to apply: °Eligibility screenings are held every Tuesday and Wednesday afternoon from 1:00 pm until 4:00 pm. You do not need an appointment for the interview!  °[facility] [address], [place], [place] 336-631-2330   °[facility]  336-342-8273   °[facility]  336-703-3100   °[facility]  336-570-6415   ° °Behavioral Health Resources in the Community: °Intensive Outpatient Programs °Organization         Address  Phone  Notes  °[facility] [address], [place], [place] 336-878-6098   °[facility] [address], [place], [place] 336-832-9800   °[facility] [address], [place], [place] ° 336-882-2125   °[facility] [address],  °[place], [place] 1-800-853-5163 or 336-641-4981   °Substance Abuse Resources °Organization         Address  Phone  Notes  °[facility]  336-882-2125   °[facility]  336-784-9470   °[facility]  336-285-9073   °[facility]  336-845-3988   °Residential & Outpatient Substance Abuse Program  1-800-659-3381   °Psychological Services °Organization         Address  Phone  Notes  °[facility]  336- 832-9600   °[facility]  336- 378-7881   °[facility] [address], [place] 1-800-853-5163 or 336-641-4981   ° °Mobile Crisis Teams °Organization         Address  Phone  Notes  °[facility], Mobile Crisis Care Unit  1-877-626-1772   °Assertive °[facility] ° [address]  [place], [place] 336-834-9664   °Sharon DeEsch [address] °[place] 336-554-5454   ° °Self-Help/Support Groups °Organization         Address  Phone             Notes  °[facility]  - variety of support groups  336- 373-1402 Call for more information  °Narcotics Anonymous (NA), [facility] [address], °[place]   2 meetings at this location  ° °  Residential Treatment Programs Organization         Address  Phone  Notes  [facility] [address]    [place]  1-(934)732-6145   [facility]  [address], [place] T5558594, [place], [place]   [place], [place] (534)240-5281 Admissions: 8am-3pm M-F  [facility] [place] [address],    [place], [place] X4321937   [facility] [address] [place], [place], [place]   [facility] [address]  [place], [place]   [facility] Intensive Outpatient [address], [place], [place]   [facility]  [place], [place] 1-3340691514 or 9187839952   [facility] [address] [place], [place] Accepts Medicaid  [facility] [place] [address],  [place] 1-803-863-0168 Substance Abuse/Addiction Treatment   [facility]         Address  Phone  Notes  [facility]  613-759-1394   Domenic Schwab, PhD [address] [place], [place]   3018271489 or (252)092-4569   [facility] [place], [place] (940)598-6850   [facility] [address], [place], [place] 309-378-1680 Insurance/Medicaid/sponsorship through [facility] [address]                                    [place], [place] (902) 446-6533 [place] [address], [place] 2288338852    Dr. Adele Schilder  825-624-6515   [facility]. 1) [address] [place] 2) [place] 3)  [address], [place] 317-796-8512 (938) 860-3709  276-556-9129   [place] 548-138-8685 or 281-283-2563 (After Hours)      Take the prescriptions as directed.  Apply moist heat or ice to the area(s) of discomfort, for 15 minutes at a time, several times per day for the next few days.  Do not fall asleep on a heating or ice pack.  Call your regular Pain Management doctor tomorrow to schedule a follow up appointment this week. Call the dentist tomorrow to schedule a follow up appointment within the next week.  Return to the Emergency Department immediately if worsening.

## 2015-04-09 NOTE — ED Provider Notes (Signed)
CSN: [id number]     Arrival date & time 04/09/15  1932 History   First MD Initiated Contact with Patient 04/09/15 1944     Chief Complaint  Patient presents with  . Cough  . Chest Pain      HPI Pt was seen at [facility].  Per pt, c/o gradual onset and persistence of constant chest wall "pain" for the past 4 to 5 weeks. Pt describes the pain as starting in her left chest, and wrapping around the back of her body to her anterior right chest. Has been associated with cough. Per pt, c/o gradual onset and persistence of constant bilat upper teeth "pain" for the past several months.  States she "can't get into the dentist to get my teeth pulled for another few months" and is requesting antibiotics "to help it out." Denies fevers, no intra-oral edema, no rash, no facial swelling, no dysphagia, no neck pain.      Past Medical History  Diagnosis Date  . Allergic rhinitis   . Anxiety and depression   . Osteoarthritis   . PUD (peptic ulcer disease)   . Gout   . DJD (degenerative joint disease) of lumbar spine   . Chronic back pain   . GI bleed   . Chronic pain syndrome   . Vaginal Pap smear, abnormal 10-29-14    ASCUS negative HPV  . Fibromyalgia   . Hyperlipidemia   . Chronic chest wall pain   . Cervical cancer ([place])     in her 9's  . Pain management    Past Surgical History  Procedure Laterality Date  . Total abdominal hysterectomy    . Cervival cone    . Tubal ligation    . Appendectomy    . Throat surgery    . I&d extremity Right 08/04/2013    Procedure: IRRIGATION AND DEBRIDEMENT RIGHT HAND AND FOREARM;  Surgeon: Roseanne Kaufman, MD;  Location: [place];  Service: Orthopedics;  Laterality: Right;  . Fasciotomy Right 08/04/2013    Procedure: FASCIOTOMY;  Surgeon: Roseanne Kaufman, MD;  Location: [place];  Service: Orthopedics;  Laterality: Right;  . Application of wound vac Right 08/04/2013    Procedure: APPLICATION OF WOUND VAC;  Surgeon: Roseanne Kaufman, MD;  Location: [place];  Service:  Orthopedics;  Laterality: Right;  . I&d extremity Right 08/05/2013    Procedure: IRRIGATION AND DEBRIDEMENT RIGHT HAND AND FOREARM with SECONDARY CLOSURE ;  Surgeon: Roseanne Kaufman, MD;  Location: [place];  Service: Orthopedics;  Laterality: Right;  . I&d extremity Right 08/07/2013    Procedure: IRRIGATION AND DEBRIDEMENT RIGHT ARM, FOREARM, HAND AND SKIN GRAFTING AS NECESSARY TO AFECTED AREAS (FOREARM AND HAND );  Surgeon: Roseanne Kaufman, MD;  Location: [place];  Service: Orthopedics;  Laterality: Right;  graft from right thigh to arm  . Skin full thickness graft Right 08/07/2013    Procedure: AND SKIN GRAFTING AS NECESSARY TO AFECTED AREAS (FOREARM AND HAND );  Surgeon: Roseanne Kaufman, MD;  Location: [place];  Service: Orthopedics;  Laterality: Right;  skin graft from right thigh   Family History  Problem Relation Age of Onset  . Coronary artery disease Father   . Hypertension Father   . Diabetes Father   . Cancer Father   . Heart disease Father   . Diabetes type II Mother   . Alcohol abuse Mother   . COPD Mother   . Diabetes Sister   . Heart disease Sister    Social History  Substance  Use Topics  . Smoking status: Current Every Day Smoker -- 0.25 packs/day for 40 years    Types: Cigarettes  . Smokeless tobacco: Never Used  . Alcohol Use: No    Review of Systems ROS: Statement: All systems negative except as marked or noted in the HPI; Constitutional: Negative for fever and chills. ; ; Eyes: Negative for eye pain and discharge. ; ; ENMT: Positive for dental caries, dental hygiene poor and toothache. Negative for ear pain, bleeding gums, dental injury, facial deformity, facial swelling, hoarseness, nasal congestion, sinus pressure, sore throat, throat swelling and tongue swollen. ; ; Cardiovascular: Negative for chest pain, palpitations, diaphoresis, dyspnea and peripheral edema. ; ; Respiratory: +cough. Negative for wheezing and stridor. ; ; Gastrointestinal: Negative for nausea, vomiting,  diarrhea, abdominal pain, blood in stool, hematemesis, jaundice and rectal bleeding. . ; ; Genitourinary: Negative for dysuria, flank pain and hematuria. ; ; Musculoskeletal: +chest wall pain, back pain. Negative for neck pain. Negative for swelling and trauma.; ; Skin: Negative for pruritus, rash, abrasions, blisters, bruising and skin lesion.; ; Neuro: Negative for headache, lightheadedness and neck stiffness. Negative for weakness, altered level of consciousness , altered mental status, extremity weakness, paresthesias, involuntary movement, seizure and syncope.     Allergies  Asa and Vicodin  Home Medications   Prior to Admission medications   Medication Sig Start Date End Date Taking? Authorizing Provider  ALPRAZolam Duanne Moron) 1 MG tablet Take 1 tablet (1 mg total) by mouth 2 (two) times daily as needed for anxiety. 08/10/13   Avelina Laine, PA-C  BIOTIN 5000 PO Take 2 tablets by mouth daily.    Historical Provider, MD  cephALEXin (KEFLEX) 500 MG capsule Take 1 capsule (500 mg total) by mouth 4 (four) times daily. 01/29/15   Jonnie Kind, MD  conjugated estrogens (PREMARIN) vaginal cream Place 1 Applicatorful vaginally at bedtime. At bedtime x 2 wks, then 3x weekly thereafter Patient not taking: Reported on 01/29/2015 01/01/15   Jonnie Kind, MD  folic acid (FOLVITE) A999333 MCG tablet Take 800 mcg by mouth daily.    Historical Provider, MD  furosemide (LASIX) 20 MG tablet  09/28/14   Historical Provider, MD  oxycodone (ROXICODONE) 30 MG immediate release tablet Take 30 mg by mouth 4 (four) times daily.     Historical Provider, MD  pantoprazole (PROTONIX) 40 MG tablet Take 40 mg by mouth 2 (two) times daily.    Historical Provider, MD  simvastatin (ZOCOR) 20 MG tablet  11/15/14   Historical Provider, MD   BP 142/65 mmHg  Pulse 79  Temp(Src) 97.6 F (36.4 C) (Oral)  Resp 20  Ht 5\' 4"  (1.626 m)  Wt 190 lb (86.183 kg)  BMI 32.60 kg/m2  SpO2 100%  LMP 09/16/2000 Physical Exam   1950: Physical examination: Vital signs and O2 SAT: Reviewed; Constitutional: Well developed, Well nourished, Well hydrated, In no acute distress; Head and Face: Normocephalic, Atraumatic; Eyes: EOMI, PERRL, No scleral icterus; ENMT: Mouth and pharynx normal, Poor dentition, Widespread dental decay, Left TM normal, Right TM normal, Mucous membranes moist, +upper bilat molars with dental decay.  No gingival erythema, edema, fluctuance, or drainage.  No intra-oral edema. No submandibular or sublingual edema. No hoarse voice, no drooling, no stridor. No trismus. ;; Neck: Supple, Full range of motion, No lymphadenopathy; Cardiovascular: Regular rate and rhythm, No murmur, rub, or gallop; Respiratory: Breath sounds coarse & equal bilaterally, No wheezes.  Speaking full sentences with ease, Normal respiratory effort/excursion; Chest: +TTP bilat anterior  and lateral chest wall. No deformity, no rash, no soft tissue crepitus. Movement normal; Abdomen: Soft, Nontender, Nondistended, Normal bowel sounds; Genitourinary: No CVA tenderness; Spine:  No midline CS, TS, LS tenderness. +TTP bilat thoracic paraspinal muscles. No rash.;; Extremities: Pulses normal, No tenderness, No edema, No calf edema or asymmetry.; Neuro: AA&Ox3, Major CN grossly intact.  Speech clear. No gross focal motor or sensory deficits in extremities. Climbs on and off stretcher easily by herself. Gait steady.; Skin: Color normal, Warm, Dry.   ED Course  Procedures (including critical care time) Labs Review   Imaging Review  I have personally reviewed and evaluated these images and lab results as part of my medical decision-making.   EKG Interpretation   Date/Time:  Wednesday April 09 2015 20:05:30 EST Ventricular Rate:  78 PR Interval:  160 QRS Duration: 112 QT Interval:  444 QTC Calculation: 506 R Axis:   78 Text Interpretation:  Sinus rhythm Ventricular bigeminy Borderline  intraventricular conduction delay Low voltage,  precordial leads When  compared with ECG of 03/22/2014 Premature ventricular complexes are now  Present QT has shortened Otherwise no significant change Confirmed by  [facility]  MD, Nunzio Cory [phone]) on 04/09/2015 8:19:59 PM      MDM  MDM Reviewed: previous chart, nursing note and vitals Reviewed previous: labs and ECG Interpretation: labs, ECG and x-ray     Results for orders placed or performed during the hospital encounter of 04/09/15  D-dimer, quantitative  Result Value Ref Range   D-Dimer, Quant 0.43 0.00 - 0.50 ug/mL-FEU  CBC with Differential  Result Value Ref Range   WBC 9.6 4.0 - 10.5 K/uL   RBC 4.46 3.87 - 5.11 MIL/uL   Hemoglobin 13.7 12.0 - 15.0 g/dL   HCT 40.3 36.0 - 46.0 %   MCV 90.4 78.0 - 100.0 fL   MCH 30.7 26.0 - 34.0 pg   MCHC 34.0 30.0 - 36.0 g/dL   RDW 14.7 11.5 - 15.5 %   Platelets 291 150 - 400 K/uL   Neutrophils Relative % 54 %   Neutro Abs 5.2 1.7 - 7.7 K/uL   Lymphocytes Relative 35 %   Lymphs Abs 3.4 0.7 - 4.0 K/uL   Monocytes Relative 5 %   Monocytes Absolute 0.5 0.1 - 1.0 K/uL   Eosinophils Relative 5 %   Eosinophils Absolute 0.5 0.0 - 0.7 K/uL   Basophils Relative 1 %   Basophils Absolute 0.1 0.0 - 0.1 K/uL  Troponin I  Result Value Ref Range   Troponin I <0.03 <0.031 ng/mL  Basic metabolic panel  Result Value Ref Range   Sodium 137 135 - 145 mmol/L   Potassium 3.8 3.5 - 5.1 mmol/L   Chloride 101 101 - 111 mmol/L   CO2 27 22 - 32 mmol/L   Glucose, Bld 110 (H) 65 - 99 mg/dL   BUN 11 6 - 20 mg/dL   Creatinine, Ser 0.80 0.44 - 1.00 mg/dL   Calcium 9.0 8.9 - 10.3 mg/dL   GFR calc non Af Amer >60 >60 mL/min   GFR calc Af Amer >60 >60 mL/min   Anion gap 9 5 - 15   Dg Chest 2 View 04/09/2015  CLINICAL DATA:  Productive cough and congestion for 5 weeks, sore throat for 3 days EXAM: CHEST  2 VIEW COMPARISON:  A 02/2015 FINDINGS: Normal heart size, mediastinal contours, and pulmonary vascularity. Minimal subsegmental atelectasis LEFT  costophrenic angle. Lungs otherwise clear. No pleural effusion or pneumothorax. Bones demineralized. IMPRESSION: Minimal subsegmental  atelectasis LEFT costophrenic angle. Electronically Signed   By: Lavonia Dana M.D.   On: 04/09/2015 21:06    2115:  Doubt PE as cause for symptoms with normal d-dimer and low risk [place].  Doubt ACS as cause for symptoms with normal troponin and unchanged EKG from previous after 4 to 5 weeks of constant symptoms. Pt already has oxycodone rx; will not rx further narcotics. Tx dental caries and msk pain symptomatically at this time. Dx and testing d/w pt.  Questions answered.  Verb understanding, agreeable to d/c home with outpt f/u.    Francine Graven, DO 04/12/15 1655

## 2015-04-09 NOTE — ED Notes (Signed)
Pt c/o cough and congestion for a while.

## 2015-07-08 ENCOUNTER — Other Ambulatory Visit (HOSPITAL_COMMUNITY): Payer: Self-pay | Admitting: Internal Medicine

## 2015-07-08 DIAGNOSIS — R109 Unspecified abdominal pain: Secondary | ICD-10-CM

## 2015-07-14 ENCOUNTER — Ambulatory Visit (HOSPITAL_COMMUNITY)
Admission: RE | Admit: 2015-07-14 | Discharge: 2015-07-14 | Disposition: A | Discharge status: Home / Self Care | Payer: Medicaid Other | Source: Ambulatory Visit | Attending: Internal Medicine | Admitting: Internal Medicine

## 2015-07-14 ENCOUNTER — Ambulatory Visit (HOSPITAL_COMMUNITY): Payer: Medicaid Other

## 2015-07-14 DIAGNOSIS — R109 Unspecified abdominal pain: Secondary | ICD-10-CM | POA: Diagnosis not present

## 2015-07-14 DIAGNOSIS — F17200 Nicotine dependence, unspecified, uncomplicated: Secondary | ICD-10-CM | POA: Diagnosis not present

## 2015-07-14 DIAGNOSIS — K5730 Diverticulosis of large intestine without perforation or abscess without bleeding: Secondary | ICD-10-CM | POA: Diagnosis not present

## 2015-07-14 DIAGNOSIS — R05 Cough: Secondary | ICD-10-CM | POA: Diagnosis present

## 2015-07-14 DIAGNOSIS — R935 Abnormal findings on diagnostic imaging of other abdominal regions, including retroperitoneum: Secondary | ICD-10-CM | POA: Diagnosis not present

## 2015-07-14 DIAGNOSIS — G8929 Other chronic pain: Secondary | ICD-10-CM | POA: Insufficient documentation

## 2015-07-14 MED ORDER — IOHEXOL 300 MG/ML  SOLN
100.0000 mL | Freq: Once | INTRAMUSCULAR | Status: AC | PRN
  Administered 2015-07-14: 100 mL via INTRAVENOUS

## 2015-09-03 ENCOUNTER — Other Ambulatory Visit (HOSPITAL_COMMUNITY): Payer: Self-pay | Admitting: Internal Medicine

## 2015-09-03 DIAGNOSIS — R109 Unspecified abdominal pain: Secondary | ICD-10-CM

## 2015-10-17 ENCOUNTER — Other Ambulatory Visit (HOSPITAL_COMMUNITY): Payer: Self-pay | Admitting: Internal Medicine

## 2015-10-17 DIAGNOSIS — R109 Unspecified abdominal pain: Secondary | ICD-10-CM

## 2015-10-17 DIAGNOSIS — E279 Disorder of adrenal gland, unspecified: Secondary | ICD-10-CM

## 2015-10-17 DIAGNOSIS — N281 Cyst of kidney, acquired: Secondary | ICD-10-CM

## 2015-10-27 ENCOUNTER — Ambulatory Visit (HOSPITAL_COMMUNITY): Payer: Medicaid Other

## 2015-11-07 ENCOUNTER — Ambulatory Visit (HOSPITAL_COMMUNITY)
Admission: RE | Admit: 2015-11-07 | Discharge: 2015-11-07 | Disposition: A | Discharge status: Home / Self Care | Payer: Medicaid Other | Source: Ambulatory Visit | Attending: Internal Medicine | Admitting: Internal Medicine

## 2015-11-07 DIAGNOSIS — R109 Unspecified abdominal pain: Secondary | ICD-10-CM | POA: Diagnosis not present

## 2015-11-07 DIAGNOSIS — I700 Atherosclerosis of aorta: Secondary | ICD-10-CM | POA: Insufficient documentation

## 2015-11-07 DIAGNOSIS — E278 Other specified disorders of adrenal gland: Secondary | ICD-10-CM | POA: Diagnosis not present

## 2015-11-07 DIAGNOSIS — E279 Disorder of adrenal gland, unspecified: Secondary | ICD-10-CM | POA: Insufficient documentation

## 2015-11-07 DIAGNOSIS — N281 Cyst of kidney, acquired: Secondary | ICD-10-CM | POA: Diagnosis not present

## 2015-11-07 DIAGNOSIS — K760 Fatty (change of) liver, not elsewhere classified: Secondary | ICD-10-CM | POA: Insufficient documentation

## 2015-11-07 MED ORDER — IOPAMIDOL (ISOVUE-300) INJECTION 61%
100.0000 mL | Freq: Once | INTRAVENOUS | Status: AC | PRN
  Administered 2015-11-07: 100 mL via INTRAVENOUS

## 2015-11-11 ENCOUNTER — Other Ambulatory Visit (HOSPITAL_COMMUNITY): Payer: Self-pay | Admitting: Internal Medicine

## 2015-11-11 DIAGNOSIS — Z1231 Encounter for screening mammogram for malignant neoplasm of breast: Secondary | ICD-10-CM

## 2015-12-08 ENCOUNTER — Ambulatory Visit (HOSPITAL_COMMUNITY): Payer: Medicaid Other

## 2016-01-03 ENCOUNTER — Other Ambulatory Visit: Payer: Self-pay | Admitting: Obstetrics and Gynecology

## 2016-01-05 ENCOUNTER — Other Ambulatory Visit: Payer: Self-pay | Admitting: *Deleted

## 2016-01-08 MED ORDER — ESTROGENS, CONJUGATED 0.625 MG/GM VA CREA: 1.0000 | TOPICAL_CREAM | Freq: Every day | VAGINAL | 12 refills | Status: DC

## 2016-01-27 ENCOUNTER — Emergency Department (HOSPITAL_COMMUNITY)
Admission: EM | Admit: 2016-01-27 | Discharge: 2016-01-27 | Discharge status: Absent without leave | Payer: Medicaid Other

## 2016-01-28 ENCOUNTER — Other Ambulatory Visit (HOSPITAL_COMMUNITY): Payer: Self-pay | Admitting: Internal Medicine

## 2016-01-28 ENCOUNTER — Ambulatory Visit (HOSPITAL_COMMUNITY)
Admission: RE | Admit: 2016-01-28 | Discharge: 2016-01-28 | Disposition: A | Discharge status: Home / Self Care | Payer: Medicaid Other | Source: Ambulatory Visit | Attending: Internal Medicine | Admitting: Internal Medicine

## 2016-01-28 ENCOUNTER — Other Ambulatory Visit: Payer: Self-pay | Admitting: Internal Medicine

## 2016-01-28 ENCOUNTER — Encounter (HOSPITAL_COMMUNITY): Payer: Self-pay

## 2016-01-28 DIAGNOSIS — M79604 Pain in right leg: Secondary | ICD-10-CM

## 2016-01-28 DIAGNOSIS — M79605 Pain in left leg: Principal | ICD-10-CM

## 2016-02-02 ENCOUNTER — Ambulatory Visit (INDEPENDENT_AMBULATORY_CARE_PROVIDER_SITE_OTHER): Payer: Medicaid Other | Admitting: Obstetrics and Gynecology

## 2016-02-02 ENCOUNTER — Encounter (INDEPENDENT_AMBULATORY_CARE_PROVIDER_SITE_OTHER): Payer: Self-pay

## 2016-02-02 ENCOUNTER — Encounter: Payer: Self-pay | Admitting: Obstetrics and Gynecology

## 2016-02-02 DIAGNOSIS — Z01419 Encounter for gynecological examination (general) (routine) without abnormal findings: Secondary | ICD-10-CM | POA: Insufficient documentation

## 2016-02-02 DIAGNOSIS — Z0000 Encounter for general adult medical examination without abnormal findings: Secondary | ICD-10-CM

## 2016-02-02 NOTE — Progress Notes (Signed)
Assessment:  Annual Gyn Exam   Plan:  1. return annually or prn 2    Annual mammogram advised 3.   Refill Prem[place] Subjective:  [name] is a 55 y.o. female No obstetric history on file. who presents for annual exam. Patient's last menstrual period was 09/16/2000.   The patient has complaints today of breast soreness and nipple pain she describes as a burning sensation. She has applied Bengay without relief.   The following portions of the patient's history were reviewed and updated as appropriate: allergies, current medications, past family history, past medical history, past social history, past surgical history and problem list.  Past Medical History:  Diagnosis Date  . Allergic rhinitis   . Anxiety and depression   . Cervical cancer ([place])    in her 30's  . Chronic back pain   . Chronic chest wall pain   . Chronic pain syndrome   . 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 DJD (degenerative joint disease) of lumbar spine   . Fibromyalgia   . GI bleed   . Gout   . Hyperlipidemia   . Osteoarthritis   . Pain management   . PUD (peptic ulcer disease)   . Vaginal Pap smear, abnormal 10-29-14   ASCUS negative HPV    Past Surgical History:  Procedure Laterality Date  . APPLICATION OF WOUND VAC Right 08/04/2013   Procedure: APPLICATION OF WOUND VAC;  Surgeon: Roseanne Kaufman, MD;  Location: [place];  Service: Orthopedics;  Laterality: Right;  . Cervival cone    . FASCIOTOMY Right 08/04/2013   Procedure: FASCIOTOMY;  Surgeon: Roseanne Kaufman, MD;  Location: [place];  Service: Orthopedics;  Laterality: Right;  . I&D EXTREMITY Right 08/04/2013   Procedure: IRRIGATION AND DEBRIDEMENT RIGHT HAND AND FOREARM;  Surgeon: Roseanne Kaufman, MD;  Location: [place];  Service: Orthopedics;  Laterality: Right;  . I&D EXTREMITY Right 08/05/2013   Procedure: IRRIGATION AND DEBRIDEMENT RIGHT HAND AND FOREARM with SECONDARY CLOSURE ;  Surgeon: Roseanne Kaufman, MD;  Location: [place];  Service: Orthopedics;   Laterality: Right;  . I&D EXTREMITY Right 08/07/2013   Procedure: IRRIGATION AND DEBRIDEMENT RIGHT ARM, FOREARM, HAND AND SKIN GRAFTING AS NECESSARY TO AFECTED AREAS (FOREARM AND HAND );  Surgeon: Roseanne Kaufman, MD;  Location: [place];  Service: Orthopedics;  Laterality: Right;  graft from right thigh to arm  . SKIN FULL THICKNESS GRAFT Right 08/07/2013   Procedure: AND SKIN GRAFTING AS NECESSARY TO AFECTED AREAS (FOREARM AND HAND );  Surgeon: Roseanne Kaufman, MD;  Location: [place];  Service: Orthopedics;  Laterality: Right;  skin graft from right thigh  . THROAT SURGERY    . TOTAL ABDOMINAL HYSTERECTOMY    . TUBAL LIGATION       Current Outpatient Prescriptions:  .  ALPRAZolam (XANAX) 1 MG tablet, Take 1 tablet (1 mg total) by mouth 2 (two) times daily as needed for anxiety., Disp: 60 tablet, Rfl: 0 .  BIOTIN 5000 PO, Take 2 tablets by mouth daily., Disp: , Rfl:  .  conjugated estrogens (PREMARIN) vaginal cream, Place 1 Applicatorful vaginally at bedtime. At bedtime x 2 wks, then 3x weekly thereafter, Disp: 42.5 g, Rfl: 12 .  furosemide (LASIX) 20 MG tablet, Take 20 mg by mouth daily as needed for fluid or edema. , Disp: , Rfl:  .  gabapentin (NEURONTIN) 300 MG capsule, TK 1 C PO TID, Disp: , Rfl: 5 .  LYRICA 150 MG capsule, TK ONE C PO TID, Disp: ,  Rfl: 3 .  oxycodone (ROXICODONE) 30 MG immediate release tablet, Take 30 mg by mouth 4 (four) times daily. , Disp: , Rfl:  .  pantoprazole (PROTONIX) 40 MG tablet, Take 40 mg by mouth 2 (two) times daily., Disp: , Rfl:  .  simvastatin (ZOCOR) 20 MG tablet, Take 20 mg by mouth daily. , Disp: , Rfl:  .  cephALEXin (KEFLEX) 500 MG capsule, Take 1 capsule (500 mg total) by mouth 4 (four) times daily. (Patient not taking: Reported on 02/02/2016), Disp: 28 capsule, Rfl: 0 .  methocarbamol (ROBAXIN) 500 MG tablet, Take 2 tablets (1,000 mg total) by mouth 4 (four) times daily as needed for muscle spasms (muscle spasm/pain). (Patient not taking: Reported on  02/02/2016), Disp: 25 tablet, Rfl: 0 .  penicillin v potassium (VEETID) 250 MG tablet, Take 1 tablet (250 mg total) by mouth 4 (four) times daily. (Patient not taking: Reported on 02/02/2016), Disp: 20 tablet, Rfl: 0  Review of Systems Constitutional: negative Gastrointestinal: negative Genitourinary: negative  Objective:  BP (!) 156/88   Pulse 66   Ht 5\' 4"  (1.626 m)   Wt 209 lb 12.8 oz (95.2 kg)   LMP 09/16/2000   BMI 36.01 kg/m    BMI: Body mass index is 36.01 kg/m.  General Appearance: Alert, appropriate appearance for age. No acute distress HEENT: Grossly normal Neck / Thyroid:  Cardiovascular: RRR; normal S1, S2, no murmur Lungs: CTA bilaterally Back: No CVAT Breast Exam: No dimpling, nipple retraction or discharge. No masses or nodes., Normal to inspection, Normal breast tissue bilaterally and No masses or nodes.No dimpling, nipple retraction or discharge. Gastrointestinal: Soft, non-tender, no masses or organomegaly Pelvic Exam: External genitalia: normal general appearance Vaginal: well-supported, mobile, normal mucosa without prolapse or lesions and normal without tenderness, induration or masses Cervix: removed surgically Adnexa: normal bimanual exam Uterus: removed surgically Rectovaginal: normal rectal, no masses and guaiac negative stool obtained Lymphatic Exam: Non-palpable nodes in neck, clavicular, axillary, or inguinal regions  Skin: no rash or abnormalities Neurologic: Normal gait and speech, no tremor  Psychiatric: Alert and oriented, appropriate affect.  Urinalysis:Not done  Mallory Shirk. MD Pgr [phone] 4:16 PM   By signing my name below, I, Sonum Patel, attest that this documentation has been prepared under the direction and in the presence of Jonnie Kind, MD. Electronically Signed: Sonum Patel, Education administrator. 02/02/16. 4:16 PM.  I personally performed the services described in this documentation, which was SCRIBED in my presence. The recorded  information has been reviewed and considered accurate. It has been edited as necessary during review. Jonnie Kind, MD

## 2016-02-09 ENCOUNTER — Ambulatory Visit (HOSPITAL_COMMUNITY)
Admission: RE | Admit: 2016-02-09 | Discharge: 2016-02-09 | Disposition: A | Discharge status: Home / Self Care | Payer: Medicaid Other | Source: Ambulatory Visit | Attending: Internal Medicine | Admitting: Internal Medicine

## 2016-02-09 DIAGNOSIS — Z1231 Encounter for screening mammogram for malignant neoplasm of breast: Secondary | ICD-10-CM | POA: Diagnosis not present

## 2016-02-09 DIAGNOSIS — R52 Pain, unspecified: Secondary | ICD-10-CM | POA: Insufficient documentation

## 2016-02-09 DIAGNOSIS — R600 Localized edema: Secondary | ICD-10-CM | POA: Insufficient documentation

## 2016-02-09 DIAGNOSIS — M79604 Pain in right leg: Secondary | ICD-10-CM

## 2016-02-09 DIAGNOSIS — M79605 Pain in left leg: Principal | ICD-10-CM

## 2016-05-03 ENCOUNTER — Ambulatory Visit (HOSPITAL_COMMUNITY): Payer: Medicaid Other | Attending: Internal Medicine | Admitting: Physical Therapy

## 2016-05-03 ENCOUNTER — Encounter (HOSPITAL_COMMUNITY): Payer: Self-pay | Admitting: Physical Therapy

## 2016-05-03 DIAGNOSIS — G8929 Other chronic pain: Secondary | ICD-10-CM | POA: Diagnosis present

## 2016-05-03 DIAGNOSIS — M542 Cervicalgia: Secondary | ICD-10-CM | POA: Insufficient documentation

## 2016-05-03 DIAGNOSIS — M25511 Pain in right shoulder: Secondary | ICD-10-CM | POA: Insufficient documentation

## 2016-05-03 DIAGNOSIS — M79602 Pain in left arm: Secondary | ICD-10-CM | POA: Diagnosis present
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
## 2016-05-26 ENCOUNTER — Encounter: Payer: Self-pay | Admitting: Internal Medicine

## 2016-06-20 ENCOUNTER — Encounter (HOSPITAL_COMMUNITY): Payer: Self-pay | Admitting: Emergency Medicine

## 2016-06-20 ENCOUNTER — Emergency Department (HOSPITAL_COMMUNITY): Payer: Medicaid Other

## 2016-06-20 ENCOUNTER — Emergency Department (HOSPITAL_COMMUNITY)
Admission: EM | Admit: 2016-06-20 | Discharge: 2016-06-20 | Discharge status: Against Medical Advice | Payer: Medicaid Other | Attending: Emergency Medicine | Admitting: Emergency Medicine

## 2016-06-20 DIAGNOSIS — F17210 Nicotine dependence, cigarettes, uncomplicated: Secondary | ICD-10-CM | POA: Insufficient documentation

## 2016-06-20 DIAGNOSIS — K0889 Other specified disorders of teeth and supporting structures: Secondary | ICD-10-CM | POA: Insufficient documentation

## 2016-06-20 DIAGNOSIS — I10 Essential (primary) hypertension: Secondary | ICD-10-CM | POA: Insufficient documentation

## 2016-06-20 MED ORDER — LIDOCAINE HCL (PF) 1 % IJ SOLN
INTRAMUSCULAR | Status: AC
  Administered 2016-06-20: 2.1 mL
  Filled 2016-06-20: qty 5

## 2016-06-20 MED ORDER — CEFTRIAXONE SODIUM 1 G IJ SOLR
1.0000 g | Freq: Once | INTRAMUSCULAR | Status: AC
  Administered 2016-06-20: 1 g via INTRAMUSCULAR
  Filled 2016-06-20: qty 10

## 2016-06-20 MED ORDER — FENTANYL CITRATE (PF) 100 MCG/2ML IJ SOLN
50.0000 ug | Freq: Once | INTRAMUSCULAR | Status: DC
Start: 2016-06-20 — End: 2016-06-20
  Filled 2016-06-20: qty 2

## 2016-06-20 MED ORDER — SODIUM CHLORIDE 0.9 % IV BOLUS (SEPSIS): 1000.0000 mL | Freq: Once | INTRAVENOUS | Status: DC

## 2016-06-20 MED ORDER — FENTANYL CITRATE (PF) 100 MCG/2ML IJ SOLN
50.0000 ug | Freq: Once | INTRAMUSCULAR | Status: AC
  Administered 2016-06-20: 50 ug via INTRAMUSCULAR

## 2016-06-20 NOTE — ED Triage Notes (Signed)
Pt states that she has been treated for dental abscesses for the past 6 weeks.  Was sent to ER by dentist to be evaluated for IV antibiotics

## 2016-06-20 NOTE — ED Provider Notes (Signed)
[facility] DEPT Provider Note   CSN: [id number] Arrival date & time: 06/20/16  1904     History   Chief Complaint Chief Complaint  Patient presents with  . Dental Pain    HPI [name] is a 56 y.o. female.   Dental Pain   This is a chronic problem. The current episode started more than 1 week ago. The problem occurs constantly. The problem has been gradually worsening. The pain is moderate. She has tried heat and ice (antibiotics) for the symptoms. The treatment provided no relief.    Past Medical History:  Diagnosis Date  . Allergic rhinitis   . Anxiety and depression   . Cervical cancer ([place])    in her 48's  . Chronic back pain   . Chronic chest wall pain   . Chronic pain syndrome   . Compartment syndrome ([place])   . DJD (degenerative joint disease) of lumbar spine   . Fibromyalgia   . GI bleed   . Gout   . Hyperlipidemia   . Osteoarthritis   . Pain management   . PUD (peptic ulcer disease)   . Vaginal Pap smear, abnormal 10-29-14   ASCUS negative HPV    Patient Active Problem List   Diagnosis Date Noted  . Well woman exam with routine gynecological exam 02/02/2016  . Pap smear abnormality of cervix with ASCUS favoring benign 12/18/2014  . Carpal tunnel syndrome 09/03/2013  . Late effect of tendon injury 09/03/2013  . Pain in joint, hand 09/03/2013  . Pain in joint, forearm 09/03/2013  . Unspecified disorder of muscle, ligament, and fascia 09/03/2013  . Compartment syndrome, traumatic, upper extremity ([place]) 08/04/2013  . Cellulitis of hand, right 08/04/2013  . Chronic pain 01/06/2013  . Depression 01/06/2013  . Hypertension 01/06/2013  . Chest pain 07/18/2012  . Dyspnea 07/18/2012  . VITAMIN B12 DEFICIENCY 08/30/2007  . HOARSENESS 08/30/2007  . PERIODONTITIS, CHRONIC NOS 12/19/2006  . TOBACCO ABUSE 11/22/2006  . OSTEOARTHRITIS 11/22/2006  . Gout, unspecified 10/31/2006  . DISORDER, BIPOLAR NOS 10/31/2006  . ANXIETY 10/31/2006  .  BENZODIAZEPINE ADDICTION 10/31/2006  . ALLERGIC RHINITIS 10/31/2006  . PEPTIC ULCER DISEASE 10/31/2006  . CONSTIPATION NOS 10/31/2006  . ARTHRITIS 10/31/2006  . NECK PAIN 10/31/2006  . LOW BACK PAIN 10/31/2006  . LEG PAIN, CHRONIC 10/31/2006  . CERVICAL CANCER, HX OF 10/31/2006  . RECTAL BLEEDING, HX OF 10/31/2006  . MIGRAINES, HX OF 10/31/2006    Past Surgical History:  Procedure Laterality Date  . APPLICATION OF WOUND VAC Right 08/04/2013   Procedure: APPLICATION OF WOUND VAC;  Surgeon: Roseanne Kaufman, MD;  Location: [facility];  Service: Orthopedics;  Laterality: Right;  . Cervival cone    . FASCIOTOMY Right 08/04/2013   Procedure: FASCIOTOMY;  Surgeon: Roseanne Kaufman, MD;  Location: [facility];  Service: Orthopedics;  Laterality: Right;  . I&D EXTREMITY Right 08/04/2013   Procedure: IRRIGATION AND DEBRIDEMENT RIGHT HAND AND FOREARM;  Surgeon: Roseanne Kaufman, MD;  Location: [facility];  Service: Orthopedics;  Laterality: Right;  . I&D EXTREMITY Right 08/05/2013   Procedure: IRRIGATION AND DEBRIDEMENT RIGHT HAND AND FOREARM with SECONDARY CLOSURE ;  Surgeon: Roseanne Kaufman, MD;  Location: [facility];  Service: Orthopedics;  Laterality: Right;  . I&D EXTREMITY Right 08/07/2013   Procedure: IRRIGATION AND DEBRIDEMENT RIGHT ARM, FOREARM, HAND AND SKIN GRAFTING AS NECESSARY TO AFECTED AREAS (FOREARM AND HAND );  Surgeon: Roseanne Kaufman, MD;  Location: [facility];  Service: Orthopedics;  Laterality: Right;  graft  from right thigh to arm  . SKIN FULL THICKNESS GRAFT Right 08/07/2013   Procedure: AND SKIN GRAFTING AS NECESSARY TO AFECTED AREAS (FOREARM AND HAND );  Surgeon: Roseanne Kaufman, MD;  Location: [facility];  Service: Orthopedics;  Laterality: Right;  skin graft from right thigh  . THROAT SURGERY    . TOTAL ABDOMINAL HYSTERECTOMY    . TUBAL LIGATION      OB History    No data available       Home Medications    Prior to Admission medications   Medication Sig Start Date End Date Taking? Authorizing Provider    ALPRAZolam Duanne Moron) 1 MG tablet Take 1 tablet (1 mg total) by mouth 2 (two) times daily as needed for anxiety. 08/10/13   Avelina Laine, PA-C  BIOTIN 5000 PO Take 2 tablets by mouth daily.    Historical Provider, MD  cephALEXin (KEFLEX) 500 MG capsule Take 1 capsule (500 mg total) by mouth 4 (four) times daily. Patient not taking: Reported on 02/02/2016 01/29/15   Jonnie Kind, MD  conjugated estrogens (PREMARIN) vaginal cream Place 1 Applicatorful vaginally at bedtime. At bedtime x 2 wks, then 3x weekly thereafter 01/08/16   Jonnie Kind, MD  furosemide (LASIX) 20 MG tablet Take 20 mg by mouth daily as needed for fluid or edema.  09/28/14   Historical Provider, MD  gabapentin (NEURONTIN) 300 MG capsule TK 1 C PO TID 01/23/16   Historical Provider, MD  LYRICA 150 MG capsule TK ONE C PO TID 01/11/16   Historical Provider, MD  methocarbamol (ROBAXIN) 500 MG tablet Take 2 tablets (1,000 mg total) by mouth 4 (four) times daily as needed for muscle spasms (muscle spasm/pain). Patient not taking: Reported on 02/02/2016 04/09/15   Francine Graven, DO  oxycodone (ROXICODONE) 30 MG immediate release tablet Take 30 mg by mouth 4 (four) times daily.     Historical Provider, MD  pantoprazole (PROTONIX) 40 MG tablet Take 40 mg by mouth 2 (two) times daily.    Historical Provider, MD  penicillin v potassium (VEETID) 250 MG tablet Take 1 tablet (250 mg total) by mouth 4 (four) times daily. Patient not taking: Reported on 02/02/2016 04/09/15   Francine Graven, DO  simvastatin (ZOCOR) 20 MG tablet Take 20 mg by mouth daily.  11/15/14   Historical Provider, MD    Family History Family History  Problem Relation Age of Onset  . Coronary artery disease Father   . Hypertension Father   . Diabetes Father   . Cancer Father   . Heart disease Father   . Diabetes type II Mother   . Alcohol abuse Mother   . COPD Mother   . Diabetes Sister   . Heart disease Sister     Social History Social History  Substance Use  Topics  . Smoking status: Current Every Day Smoker    Packs/day: 0.25    Years: 40.00    Types: Cigarettes  . Smokeless tobacco: Never Used  . Alcohol use No     Allergies   Asa [aspirin]; Vicodin [hydrocodone-acetaminophen]; and Hydrocodone-acetaminophen   Review of Systems Review of Systems  All other systems reviewed and are negative.    Physical Exam Updated Vital Signs BP (!) 119/46 (BP Location: Left Arm)   Pulse 76   Temp 98.6 F (37 C) (Temporal)   Resp 18   Ht 5\' 4"  (1.626 m)   Wt 200 lb (90.7 kg)   LMP 09/16/2000   SpO2 98%  BMI 34.33 kg/m   Physical Exam  Constitutional: She is oriented to person, place, and time. She appears well-developed and well-nourished.  HENT:  Head: Normocephalic and atraumatic.  Eyes: Conjunctivae and EOM are normal.  Neck: Normal range of motion.  Cardiovascular: Normal rate and regular rhythm.   Pulmonary/Chest: Effort normal and breath sounds normal. No stridor. No respiratory distress.  Abdominal: Soft. She exhibits no distension.  Musculoskeletal: Normal range of motion. She exhibits no edema or deformity.  Neurological: She is alert and oriented to person, place, and time.  Skin: Skin is warm and dry.  Nursing note and vitals reviewed.    ED Treatments / Results  Labs (all labs ordered are listed, but only abnormal results are displayed) Labs Reviewed - No data to display  EKG  EKG Interpretation None       Radiology No results found.  Procedures Procedures (including critical care time)  Medications Ordered in ED Medications  cefTRIAXone (ROCEPHIN) injection 1 g (not administered)  fentaNYL (SUBLIMAZE) injection 50 mcg (not administered)     Initial Impression / Assessment and Plan / ED Course  I have reviewed the triage vital signs and the nursing notes.  Pertinent labs & imaging results that were available during my care of the patient were reviewed by me and considered in my medical decision  making (see chart for details).      Dental infection v sialadenitis v lymphadenopathy v cellulitis. Plan for CT, patient competent to make decisions and refused. Only wants IM abx and discharge. Discussed risks of these being potentially life-threatening or limb threatening conditions but patient wants to leave and lives 5 minutes away and will come back if needed.  Dc ama   Final Clinical Impressions(s) / ED Diagnoses   Final diagnoses:  Pain, dental      Merrily Pew, MD 06/20/16 2010

## 2016-08-02 ENCOUNTER — Encounter (INDEPENDENT_AMBULATORY_CARE_PROVIDER_SITE_OTHER): Payer: Self-pay | Admitting: Internal Medicine

## 2016-08-04 ENCOUNTER — Encounter (INDEPENDENT_AMBULATORY_CARE_PROVIDER_SITE_OTHER): Payer: Self-pay | Admitting: Internal Medicine

## 2016-08-04 ENCOUNTER — Encounter (INDEPENDENT_AMBULATORY_CARE_PROVIDER_SITE_OTHER): Payer: Self-pay | Admitting: *Deleted

## 2016-08-04 ENCOUNTER — Ambulatory Visit (INDEPENDENT_AMBULATORY_CARE_PROVIDER_SITE_OTHER): Payer: Medicaid Other | Admitting: Internal Medicine

## 2016-08-04 VITALS — BP 104/80 | HR 72 | Temp 98.2°F | Ht 64.0 in | Wt 205.0 lb

## 2016-08-04 DIAGNOSIS — R1310 Dysphagia, unspecified: Secondary | ICD-10-CM

## 2016-08-04 DIAGNOSIS — R1319 Other dysphagia: Secondary | ICD-10-CM

## 2016-08-04 NOTE — Patient Instructions (Signed)
DG Esophagram.  

## 2016-08-04 NOTE — Progress Notes (Signed)
Subjective:    Patient ID: [name], female    DOB: 1960/08/05, 56 y.o.   MRN: [id number]  HPI Referred by Dr. Maudie Mercury for dysphagia. She tells me she has been having some epigastric pain.   . Foods are lodging in her esophagus.  States food felt like it lodged mid-esophagus.  She has been taking Protonix BID. She is watching what she eats. Her appetite is good. She has a BM x 1 day. Hx of chronic pain and take Oxycodone 30mg  four times a day.   02/23/2010 EGD: recurrent epigastric pain.  Two antra scars, otherwise normal EGD. Review of Systems Past Medical History:  Diagnosis Date  . Allergic rhinitis   . Anxiety and depression   . Cervical cancer ([place])    in her 5's  . Chronic back pain   . Chronic chest wall pain   . Chronic pain syndrome   . Compartment syndrome ([place])   . DJD (degenerative joint disease) of lumbar spine   . Fibromyalgia   . GI bleed   . Gout   . Hyperlipidemia   . Osteoarthritis   . Pain management   . PUD (peptic ulcer disease)   . Vaginal Pap smear, abnormal 10-29-14   ASCUS negative HPV    Past Surgical History:  Procedure Laterality Date  . APPLICATION OF WOUND VAC Right 08/04/2013   Procedure: APPLICATION OF WOUND VAC;  Surgeon: Roseanne Kaufman, MD;  Location: [facility];  Service: Orthopedics;  Laterality: Right;  . Cervival cone    . FASCIOTOMY Right 08/04/2013   Procedure: FASCIOTOMY;  Surgeon: Roseanne Kaufman, MD;  Location: [facility];  Service: Orthopedics;  Laterality: Right;  . I&D EXTREMITY Right 08/04/2013   Procedure: IRRIGATION AND DEBRIDEMENT RIGHT HAND AND FOREARM;  Surgeon: Roseanne Kaufman, MD;  Location: [facility];  Service: Orthopedics;  Laterality: Right;  . I&D EXTREMITY Right 08/05/2013   Procedure: IRRIGATION AND DEBRIDEMENT RIGHT HAND AND FOREARM with SECONDARY CLOSURE ;  Surgeon: Roseanne Kaufman, MD;  Location: [facility];  Service: Orthopedics;  Laterality: Right;  . I&D EXTREMITY Right 08/07/2013   Procedure: IRRIGATION AND DEBRIDEMENT RIGHT  ARM, FOREARM, HAND AND SKIN GRAFTING AS NECESSARY TO AFECTED AREAS (FOREARM AND HAND );  Surgeon: Roseanne Kaufman, MD;  Location: [facility];  Service: Orthopedics;  Laterality: Right;  graft from right thigh to arm  . SKIN FULL THICKNESS GRAFT Right 08/07/2013   Procedure: AND SKIN GRAFTING AS NECESSARY TO AFECTED AREAS (FOREARM AND HAND );  Surgeon: Roseanne Kaufman, MD;  Location: [facility];  Service: Orthopedics;  Laterality: Right;  skin graft from right thigh  . THROAT SURGERY    . TOTAL ABDOMINAL HYSTERECTOMY    . TUBAL LIGATION      Allergies  Allergen Reactions  . Asa [Aspirin] Other (See Comments)    Careful has hx of bleeding ulcers Cannot take due to hx of bleeding ulcers  . Vicodin [Hydrocodone-Acetaminophen] Itching  . Hydrocodone-Acetaminophen Rash    Current Outpatient Prescriptions on File Prior to Visit  Medication Sig Dispense Refill  . ALPRAZolam (XANAX) 1 MG tablet Take 1 tablet (1 mg total) by mouth 2 (two) times daily as needed for anxiety. 60 tablet 0  . BIOTIN 5000 PO Take 2 tablets by mouth daily.    . furosemide (LASIX) 20 MG tablet Take 20 mg by mouth daily as needed for fluid or edema.     . gabapentin (NEURONTIN) 300 MG capsule as needed  5  . LYRICA 150  MG capsule TK ONE C PO TID  3  . oxycodone (ROXICODONE) 30 MG immediate release tablet Take 30 mg by mouth 4 (four) times daily.     . pantoprazole (PROTONIX) 40 MG tablet Take 40 mg by mouth 2 (two) times daily.     No current facility-administered medications on file prior to visit.        Objective:   Physical Exam Blood pressure 104/80, pulse 72, temperature 98.2 F (36.8 C), height 5\' 4"  (1.626 m), weight 205 lb (93 kg  Alert and oriented. Skin warm and dry. Oral mucosa is moist.   . Sclera anicteric, conjunctivae is pink. Thyroid not enlarged. No cervical lymphadenopathy. Lungs clear. Heart regular rate and rhythm.  Abdomen is soft. Bowel sounds are positive. No hepatomegaly. No abdominal masses felt. No  tenderness.  No edema to lower extremities.          Assessment & Plan:  Dysphagia. Am going to get an DG esphagram. Further recommendations to follow.  If she needs an EGD/ED will need to be under Propofol.

## 2016-08-09 ENCOUNTER — Encounter (INDEPENDENT_AMBULATORY_CARE_PROVIDER_SITE_OTHER): Payer: Self-pay

## 2016-08-11 ENCOUNTER — Ambulatory Visit (HOSPITAL_COMMUNITY)
Admission: RE | Admit: 2016-08-11 | Discharge: 2016-08-11 | Disposition: A | Discharge status: Home / Self Care | Payer: Medicaid Other | Source: Ambulatory Visit | Attending: Internal Medicine | Admitting: Internal Medicine

## 2016-08-11 DIAGNOSIS — R1310 Dysphagia, unspecified: Secondary | ICD-10-CM | POA: Insufficient documentation

## 2016-08-11 DIAGNOSIS — R1319 Other dysphagia: Secondary | ICD-10-CM

## 2016-11-19 ENCOUNTER — Other Ambulatory Visit (HOSPITAL_COMMUNITY): Payer: Self-pay | Admitting: Family Medicine

## 2016-11-19 ENCOUNTER — Ambulatory Visit (HOSPITAL_COMMUNITY)
Admission: RE | Admit: 2016-11-19 | Discharge: 2016-11-19 | Disposition: A | Discharge status: Home / Self Care | Payer: Medicaid Other | Source: Ambulatory Visit | Attending: Family Medicine | Admitting: Family Medicine

## 2016-11-19 DIAGNOSIS — J441 Chronic obstructive pulmonary disease with (acute) exacerbation: Secondary | ICD-10-CM | POA: Diagnosis not present

## 2016-11-19 DIAGNOSIS — J449 Chronic obstructive pulmonary disease, unspecified: Secondary | ICD-10-CM

## 2016-11-19 DIAGNOSIS — R0600 Dyspnea, unspecified: Secondary | ICD-10-CM | POA: Diagnosis not present

## 2017-03-08 ENCOUNTER — Ambulatory Visit (INDEPENDENT_AMBULATORY_CARE_PROVIDER_SITE_OTHER): Payer: Medicaid Other | Admitting: Vascular Surgery

## 2017-03-08 ENCOUNTER — Encounter: Payer: Self-pay | Admitting: Vascular Surgery

## 2017-03-08 ENCOUNTER — Other Ambulatory Visit: Payer: Self-pay

## 2017-03-08 VITALS — BP 118/54 | HR 67 | Temp 97.4°F | Resp 16 | Ht 64.0 in | Wt 203.0 lb

## 2017-03-08 DIAGNOSIS — I83892 Varicose veins of left lower extremities with other complications: Secondary | ICD-10-CM

## 2017-03-08 NOTE — Progress Notes (Signed)
Subjective:     Patient ID: [name], female   DOB: 07-11-1960, 56 y.o.   MRN: [id number]  HPI This 56 year old female was referred for evaluation of painful varicose veins left worse than right. Patient has no history of DVT superficial thrombophlebitis stasis ulcers or bleeding. She does not develop swelling in the ankles as the day progresses. She does have some varicose veins more so on the left side and has developed some burning pain in the left distal medial thigh recently and she is not certain of the cause. She does not have claudication. She does have a long history of tobacco abuse and currently smokes 1 pack of cigarettes per day. She also has fibromyalgia and takes oxycodone on a daily basis for this.  Past Medical History:  Diagnosis Date  . Allergic rhinitis   . Anxiety and depression   . Cervical cancer ([employer])    in her 59's  . Chronic back pain   . Chronic chest wall pain   . Chronic pain syndrome   . 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 DJD (degenerative joint disease) of lumbar spine   . Fibromyalgia   . GI bleed   . Gout   . Hyperlipidemia   . Osteoarthritis   . Pain management   . PUD (peptic ulcer disease)   . Vaginal Pap smear, abnormal 10-29-14   ASCUS negative HPV    Social History   Tobacco Use  . Smoking status: Current Every Day Smoker    Packs/day: 0.25    Years: 40.00    Pack years: 10.00    Types: Cigarettes  . Smokeless tobacco: Never Used  Substance Use Topics  . Alcohol use: No    Family History  Problem Relation Age of Onset  . Coronary artery disease Father   . Hypertension Father   . Diabetes Father   . Cancer Father   . Heart disease Father   . Diabetes type II Mother   . Alcohol abuse Mother   . COPD Mother   . Diabetes Sister   . Heart disease Sister     Allergies  Allergen Reactions  . Asa [Aspirin] Other (See Comments)    Careful has hx of bleeding ulcers Cannot take due to hx of bleeding ulcers  . Vicodin  [Hydrocodone-Acetaminophen] Itching  . Hydrocodone-Acetaminophen Rash  . Hydrocodone-Acetaminophen Rash     Current Outpatient Medications:  .  ALPRAZolam (XANAX) 1 MG tablet, Take 1 tablet (1 mg total) by mouth 2 (two) times daily as needed for anxiety., Disp: 60 tablet, Rfl: 0 .  BIOTIN 5000 PO, Take 2 tablets by mouth daily., Disp: , Rfl:  .  furosemide (LASIX) 20 MG tablet, Take 20 mg by mouth daily as needed for fluid or edema. , Disp: , Rfl:  .  hydrochlorothiazide (HYDRODIURIL) 25 MG tablet, Take 25 mg by mouth daily., Disp: , Rfl:  .  oxycodone (ROXICODONE) 30 MG immediate release tablet, Take 30 mg by mouth 4 (four) times daily. , Disp: , Rfl:  .  pantoprazole (PROTONIX) 40 MG tablet, Take 40 mg by mouth 2 (two) times daily., Disp: , Rfl:  .  gabapentin (NEURONTIN) 300 MG capsule, as needed, Disp: , Rfl: 5 .  LYRICA 150 MG capsule, TK ONE C PO TID, Disp: , Rfl: 3 .  tiZANidine (ZANAFLEX) 4 MG capsule, Take 4 mg by mouth as needed for muscle spasms., Disp: , Rfl:   Vitals:   03/08/17 1343  BP: (!) 118/54  Pulse: 67  Resp: 16  Temp: (!) 97.4 F (36.3 C)  TempSrc: Oral  SpO2: 98%  Weight: 203 lb (92.1 kg)  Height: 5\' 4"  (1.626 m)    Body mass index is 34.84 kg/m.         Review of Systems   denies chest pain but does have dyspnea on exertion. See history of present illness regarding fibromyalgia. Objective:   Physical Exam BP (!) 118/54 (BP Location: Left Arm, Patient Position: Sitting, Cuff Size: Normal)   Pulse 67   Temp (!) 97.4 F (36.3 C) (Oral)   Resp 16   Ht 5\' 4"  (1.626 m)   Wt 203 lb (92.1 kg)   LMP 09/16/2000   SpO2 98%   BMI 34.84 kg/m     Gen.-alert and oriented x3 in no apparent distress HEENT normal for age Lungs no rhonchi or wheezing Cardiovascular regular rhythm no murmurs carotid pulses 3+ palpable no bruits audible Abdomen soft nontender no palpable masses Musculoskeletal free of  major deformities Skin clear -no  rashes Neurologic normal Lower extremities 3+ femoral and dorsalis pedis pulses palpable bilaterally with no edema Left leg with small cluster varicosities beginning in the mid to distal thigh extending laterally into the patellar region. No hyperpigmentation or ulceration noted bilaterally and no spider or reticular veins.  Today performed a bedside SonoSite ultrasound exam and visualized both great saphenous veins. Neither appeared grossly enlarged and neither appeared to have significant reflux       Assessment:     #1 small cluster varicose veins left distal thigh with no evidence gross reflux left great saphenous vein #2 fibromyalgia treated with oxycodone #3 ongoing tobacco abuse    Plan:     There is no indication for intervention of her venous system since the great saphenous veins appear relatively normal with no evidence of gross reflux or enlargement Do not think the burning sensation in the left distal thigh is due to her venous disease-possibly her fibromyalgia Return to see me on a when necessary basis

## 2017-08-04 ENCOUNTER — Other Ambulatory Visit (HOSPITAL_COMMUNITY): Payer: Self-pay | Admitting: Family Medicine

## 2017-08-04 DIAGNOSIS — Z1231 Encounter for screening mammogram for malignant neoplasm of breast: Secondary | ICD-10-CM

## 2017-08-22 ENCOUNTER — Ambulatory Visit (HOSPITAL_COMMUNITY): Payer: Medicaid Other

## 2017-09-05 ENCOUNTER — Ambulatory Visit (HOSPITAL_COMMUNITY)
Admission: RE | Admit: 2017-09-05 | Discharge: 2017-09-05 | Disposition: A | Discharge status: Home / Self Care | Payer: Medicaid Other | Source: Ambulatory Visit | Attending: Family Medicine | Admitting: Family Medicine

## 2017-09-05 DIAGNOSIS — Z1231 Encounter for screening mammogram for malignant neoplasm of breast: Secondary | ICD-10-CM

## 2018-01-25 ENCOUNTER — Other Ambulatory Visit: Payer: Self-pay

## 2018-01-25 ENCOUNTER — Encounter (HOSPITAL_COMMUNITY): Payer: Self-pay

## 2018-01-25 ENCOUNTER — Ambulatory Visit (HOSPITAL_COMMUNITY): Payer: Medicaid Other | Attending: Adult Health

## 2018-01-25 DIAGNOSIS — M6281 Muscle weakness (generalized): Secondary | ICD-10-CM | POA: Insufficient documentation

## 2018-01-25 NOTE — Therapy (Signed)
[place], [place], [address] Phone: [phone]   Fax:  [phone]  Physical Therapy Evaluation  Patient Details  Name: [name] MRN: [id number] Date of Birth: [birth year] Referring Provider (PT): Nsumanganyi, Ferdinand Lango, NP   Encounter Date: 01/25/2018  PT End of Session - 01/25/18 1540    Visit Number  1    Number of Visits  1    Authorization Type  Medicaid [place]    Authorization Time Period  01/25/2018    Authorization - Visit Number  1    Authorization - Number of Visits  1    PT Start Time  1528    PT Stop Time  1613    PT Time Calculation (min)  45 min    Activity Tolerance  Patient tolerated treatment well    Behavior During Therapy  [facility] for tasks assessed/performed       Past Medical History:  Diagnosis Date  . Allergic rhinitis   . Anxiety and depression   . Cervical cancer ([place])    in her 61's  . Chronic back pain   . Chronic chest wall pain   . Chronic pain syndrome   . 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 DJD (degenerative joint disease) of lumbar spine   . Fibromyalgia   . GI bleed   . Gout   . Hyperlipidemia   . Osteoarthritis   . Pain management   . PUD (peptic ulcer disease)   . Vaginal Pap smear, abnormal 10-29-14   ASCUS negative HPV    Past Surgical History:  Procedure Laterality Date  . APPLICATION OF WOUND VAC Right 08/04/2013   Procedure: APPLICATION OF WOUND VAC;  Surgeon: Roseanne Kaufman, MD;  Location: [facility];  Service: Orthopedics;  Laterality: Right;  . Cervival cone    . FASCIOTOMY Right 08/04/2013   Procedure: FASCIOTOMY;  Surgeon: Roseanne Kaufman, MD;  Location: [facility];  Service: Orthopedics;  Laterality: Right;  . I&D EXTREMITY Right 08/04/2013   Procedure: IRRIGATION AND DEBRIDEMENT RIGHT HAND AND FOREARM;  Surgeon: Roseanne Kaufman, MD;  Location: [facility];  Service: Orthopedics;  Laterality: Right;  . I&D EXTREMITY Right 08/05/2013   Procedure: IRRIGATION AND  DEBRIDEMENT RIGHT HAND AND FOREARM with SECONDARY CLOSURE ;  Surgeon: Roseanne Kaufman, MD;  Location: [facility];  Service: Orthopedics;  Laterality: Right;  . I&D EXTREMITY Right 08/07/2013   Procedure: IRRIGATION AND DEBRIDEMENT RIGHT ARM, FOREARM, HAND AND SKIN GRAFTING AS NECESSARY TO AFECTED AREAS (FOREARM AND HAND );  Surgeon: Roseanne Kaufman, MD;  Location: [facility];  Service: Orthopedics;  Laterality: Right;  graft from right thigh to arm  . SKIN FULL THICKNESS GRAFT Right 08/07/2013   Procedure: AND SKIN GRAFTING AS NECESSARY TO AFECTED AREAS (FOREARM AND HAND );  Surgeon: Roseanne Kaufman, MD;  Location: [facility];  Service: Orthopedics;  Laterality: Right;  skin graft from right thigh  . THROAT SURGERY    . TOTAL ABDOMINAL HYSTERECTOMY    . TUBAL LIGATION      There were no vitals filed for this visit.   Subjective Assessment - 01/25/18 1531    Subjective  Patient reports she is feeling unmotivated to exercise and wants someone to be there to walk with and motivate her to lose weight. She believes that with all of her medical conditions that she will feel better if she loses some weight. She has chronic pain from fibromyalgia and states sometimes exercises help that.  Pertinent History  reports history of COPD, fibromyalgia, chronic back pain    Limitations  Walking;House hold activities    How long can you walk comfortably?  almost 2 miles    Patient Stated Goals  I'm hoping to get strength and get some weight off    Currently in Pain?  Yes    Pain Score  3     Pain Location  Back    Pain Orientation  Lower;Upper    Pain Descriptors / Indicators  Aching;Dull   feels like someone has just punched me   Pain Type  Chronic pain    Pain Onset  More than a month ago    Pain Frequency  Constant    Aggravating Factors   walking or standing for prolonged periods.     Pain Relieving Factors  medication    Effect of Pain on Daily Activities  moderate        [facility] PT Assessment - 01/25/18 0001       Assessment   Medical Diagnosis  General pain    Referring Provider (PT)  Nsumanganyi, Ferdinand Lango, NP    Onset Date/Surgical Date  --   years   Hand Dominance  Right    Prior Therapy  once for neck pain, and wound care      Precautions   Precautions  None      Restrictions   Weight Bearing Restrictions  No      Balance Screen   Has the patient fallen in the past 6 months  No    Has the patient had a decrease in activity level because of a fear of falling?   No    Is the patient reluctant to leave their home because of a fear of falling?   No      Home Social worker  Private residence    Living Arrangements  Alone    Available Help at Discharge  Friend(s)    Home Access  Level entry    [place]  One level   split level with 3-4 steps down to washroom     Prior Function   Level of [place]  On disability    Leisure  get out of the house, go to bible study,       Cognition   Overall Cognitive Status  Within Functional Limits for tasks assessed      Functional Tests   Functional tests  Single leg stance      Single Leg Stance   Comments  Bil LE = 30 seconds      ROM / Strength   AROM / PROM / Strength  Strength      Strength   Strength Assessment Site  Hip;Knee;Ankle    Right Hip Flexion  5/5    Right Hip Extension  5/5    Right Hip ABduction  5/5    Left Hip Flexion  5/5    Left Hip Extension  5/5    Left Hip ABduction  5/5    Right/Left Knee  Right;Left    Right Knee Flexion  5/5    Right Knee Extension  5/5    Left Knee Flexion  5/5    Left Knee Extension  5/5    Right Ankle Dorsiflexion  5/5    Right Ankle Plantar Flexion  5/5    Left Ankle Dorsiflexion  5/5    Left Ankle Plantar Flexion  4+/5  Transfers   Five time sit to stand comments   13.7 without UE use      Ambulation/Gait   Ambulation/Gait  Yes    Ambulation/Gait Assistance  7: Independent    Ambulation Distance (Feet)  452 Feet     Assistive device  None    Gait Pattern  Within Functional Limits    Ambulation Surface  Level;Indoor    Gait velocity  1.14 m/s       Objective measurements completed on examination: See above findings.     PT Education - 01/25/18 1618    Education Details  Educated on exam findings and on need to participate in regular exercise routine at local gym facility. Provided handout on local fitness center/[employer] for 2 week free trial.    Person(s) Educated  Patient    Methods  Explanation;Handout    Comprehension  Verbalized understanding       PT Short Term Goals - 01/25/18 1622      PT SHORT TERM GOAL #1   Title  Patient will verbalize understanding of improtance of regular exercise and benefits of participating in 150 minutes of moderate activity or 75 minutes of vigorous activity each week for overall health.    Time  1    Period  Days    Status  Achieved    Target Date  01/25/18        Plan - 01/25/18 1540    Clinical Impression Statement  [name] presents for physical therapy evaluation and states she is most interested in exercising more and losing weight. She reported wanting to gain more strength as well. Objective testing this date revealed 5/5 strength for virtually all muscle groups of LE's, normal community gait velocity, no history of falling, and normal SLS balance testing. Her 5x sit to stand was within age group norms and she did not require UE assistance to perform. She was educated on her exam findings and provided a resource for a 2 week free trial to a local gym. She was educated to discuss gym memberships with her PCP to see if a medical letter may provide some insurance assistance and to discuss income based payment options with the [employer] in town. She will not require skilled PT interventions and is ready to participate in a gym program.     History and Personal Factors relevant to plan of care:  fibromyalgia, COPD    Clinical Presentation  Stable    Clinical  Presentation due to:  MMT, 2MWT, SLS, 5 time sit to stand, clinical judgement    Clinical Decision Making  Low    Rehab Potential  Poor    PT Frequency  One time visit    PT Treatment/Interventions  Patient/family education    PT Next Visit Plan  One time visit with resources for local fitness center provided.     Consulted and Agree with Plan of Care  Patient       Patient will benefit from skilled therapeutic intervention in order to improve the following deficits and impairments:  Decreased endurance  Visit Diagnosis: Muscle weakness (generalized)     Problem List Patient Active Problem List   Diagnosis Date Noted  . Varicose veins of left lower extremity with complications 19/37/9024  . Well woman exam with routine gynecological exam 02/02/2016  . Pap smear abnormality of cervix with ASCUS favoring benign 12/18/2014  . Carpal tunnel syndrome 09/03/2013  . Late effect of tendon injury 09/03/2013  . Pain in joint, hand  09/03/2013  . Pain in joint, forearm 09/03/2013  . Unspecified disorder of muscle, ligament, and fascia 09/03/2013  . 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 Cellulitis of hand, right 08/04/2013  . Chronic pain 01/06/2013  . Depression 01/06/2013  . Hypertension 01/06/2013  . Chest pain 07/18/2012  . Dyspnea 07/18/2012  . VITAMIN B12 DEFICIENCY 08/30/2007  . HOARSENESS 08/30/2007  . PERIODONTITIS, CHRONIC NOS 12/19/2006  . TOBACCO ABUSE 11/22/2006  . OSTEOARTHRITIS 11/22/2006  . Gout, unspecified 10/31/2006  . DISORDER, BIPOLAR NOS 10/31/2006  . ANXIETY 10/31/2006  . BENZODIAZEPINE ADDICTION 10/31/2006  . ALLERGIC RHINITIS 10/31/2006  . PEPTIC ULCER DISEASE 10/31/2006  . CONSTIPATION NOS 10/31/2006  . ARTHRITIS 10/31/2006  . NECK PAIN 10/31/2006  . LOW BACK PAIN 10/31/2006  . LEG PAIN, CHRONIC 10/31/2006  . CERVICAL CANCER, HX OF 10/31/2006  . RECTAL BLEEDING, HX OF 10/31/2006  . MIGRAINES, HX OF 10/31/2006     Kipp Brood, PT, DPT Physical Therapist with [facility]  01/25/2018 4:37 PM    [facility] [address] [place], [place], [address] Phone: [phone]   Fax:  [phone]  Name: [name] MRN: [id number] Date of Birth: [birth year]

## 2018-02-23 HISTORY — PX: THROAT SURGERY: SHX803

## 2018-04-14 ENCOUNTER — Emergency Department (HOSPITAL_COMMUNITY): Payer: Medicaid Other

## 2018-04-14 ENCOUNTER — Other Ambulatory Visit: Payer: Self-pay

## 2018-04-14 ENCOUNTER — Encounter (HOSPITAL_COMMUNITY): Payer: Self-pay

## 2018-04-14 ENCOUNTER — Emergency Department (HOSPITAL_COMMUNITY)
Admission: EM | Admit: 2018-04-14 | Discharge: 2018-04-14 | Disposition: A | Discharge status: Home / Self Care | Payer: Medicaid Other | Attending: Emergency Medicine | Admitting: Emergency Medicine

## 2018-04-14 DIAGNOSIS — Z79899 Other long term (current) drug therapy: Secondary | ICD-10-CM | POA: Diagnosis not present

## 2018-04-14 DIAGNOSIS — M25572 Pain in left ankle and joints of left foot: Secondary | ICD-10-CM | POA: Insufficient documentation

## 2018-04-14 DIAGNOSIS — F17210 Nicotine dependence, cigarettes, uncomplicated: Secondary | ICD-10-CM | POA: Insufficient documentation

## 2018-04-14 MED ORDER — KETOROLAC TROMETHAMINE 60 MG/2ML IM SOLN
30.0000 mg | Freq: Once | INTRAMUSCULAR | Status: AC
Start: 2018-04-14 — End: 2018-04-14
  Administered 2018-04-14: 30 mg via INTRAMUSCULAR
  Filled 2018-04-14: qty 2

## 2018-04-14 NOTE — ED Provider Notes (Signed)
[facility] EMERGENCY DEPARTMENT Provider Note   CSN: [id number] Arrival date & time: 04/14/18  1224  Time seen 4:35 AM   History   Chief Complaint Chief Complaint  Patient presents with  . Foot Pain    HPI [name] is a 57 y.o. female.  HPI patient states at 9:30 tonight she had taken someone home from [employer] study that she did not know before.  She went down the steps to leave and go to her car and was walking in the yard without lights to her car when she stepped into a deep hole.  She states she twisted her ankle "all different ways".  She said she was wearing some boots.  She was able to get home and was walking around the house but when she took off her foot she states it started getting more painful.  She states it hurt in her ankle but now her foot is intensely painful.  She states she took her oxycodone that she has for her fibromyalgia without relief.  She describes trying to wear a postop boot that she had from an injury to her other foot but it was too painful to wear.  She states she has seen both Dr. Aline Brochure and Dr. Luna Glasgow, orthopedist in the past.  PCP Jani Gravel, MD   Past Medical History:  Diagnosis Date  . Allergic rhinitis   . Anxiety and depression   . Cervical cancer ([place])    in her 50's  . Chronic back pain   . Chronic chest wall pain   . Chronic pain syndrome   . Compartment syndrome ([place])   . DJD (degenerative joint disease) of lumbar spine   . Fibromyalgia   . GI bleed   . Gout   . Hyperlipidemia   . Osteoarthritis   . Pain management   . PUD (peptic ulcer disease)   . Vaginal Pap smear, abnormal 10-29-14   ASCUS negative HPV    Patient Active Problem List   Diagnosis Date Noted  . Varicose veins of left lower extremity with complications 82/50/0370  . Well woman exam with routine gynecological exam 02/02/2016  . Pap smear abnormality of cervix with ASCUS favoring benign 12/18/2014  . Carpal tunnel syndrome 09/03/2013  . Late effect  of tendon injury 09/03/2013  . Pain in joint, hand 09/03/2013  . Pain in joint, forearm 09/03/2013  . Unspecified disorder of muscle, ligament, and fascia 09/03/2013  . 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 Cellulitis of hand, right 08/04/2013  . Chronic pain 01/06/2013  . Depression 01/06/2013  . Hypertension 01/06/2013  . Chest pain 07/18/2012  . Dyspnea 07/18/2012  . VITAMIN B12 DEFICIENCY 08/30/2007  . HOARSENESS 08/30/2007  . PERIODONTITIS, CHRONIC NOS 12/19/2006  . TOBACCO ABUSE 11/22/2006  . OSTEOARTHRITIS 11/22/2006  . Gout, unspecified 10/31/2006  . DISORDER, BIPOLAR NOS 10/31/2006  . ANXIETY 10/31/2006  . BENZODIAZEPINE ADDICTION 10/31/2006  . ALLERGIC RHINITIS 10/31/2006  . PEPTIC ULCER DISEASE 10/31/2006  . CONSTIPATION NOS 10/31/2006  . ARTHRITIS 10/31/2006  . NECK PAIN 10/31/2006  . LOW BACK PAIN 10/31/2006  . LEG PAIN, CHRONIC 10/31/2006  . CERVICAL CANCER, HX OF 10/31/2006  . RECTAL BLEEDING, HX OF 10/31/2006  . MIGRAINES, HX OF 10/31/2006    Past Surgical History:  Procedure Laterality Date  . APPLICATION OF WOUND VAC Right 08/04/2013   Procedure: APPLICATION OF WOUND VAC;  Surgeon: Roseanne Kaufman, MD;  Location: [place];  Service: Orthopedics;  Laterality: Right;  .  Cervival cone    . FASCIOTOMY Right 08/04/2013   Procedure: FASCIOTOMY;  Surgeon: Roseanne Kaufman, MD;  Location: [place];  Service: Orthopedics;  Laterality: Right;  . I&D EXTREMITY Right 08/04/2013   Procedure: IRRIGATION AND DEBRIDEMENT RIGHT HAND AND FOREARM;  Surgeon: Roseanne Kaufman, MD;  Location: [place];  Service: Orthopedics;  Laterality: Right;  . I&D EXTREMITY Right 08/05/2013   Procedure: IRRIGATION AND DEBRIDEMENT RIGHT HAND AND FOREARM with SECONDARY CLOSURE ;  Surgeon: Roseanne Kaufman, MD;  Location: [place];  Service: Orthopedics;  Laterality: Right;  . I&D EXTREMITY Right 08/07/2013   Procedure: IRRIGATION AND DEBRIDEMENT RIGHT ARM, FOREARM, HAND AND SKIN  GRAFTING AS NECESSARY TO AFECTED AREAS (FOREARM AND HAND );  Surgeon: Roseanne Kaufman, MD;  Location: [place];  Service: Orthopedics;  Laterality: Right;  graft from right thigh to arm  . SKIN FULL THICKNESS GRAFT Right 08/07/2013   Procedure: AND SKIN GRAFTING AS NECESSARY TO AFECTED AREAS (FOREARM AND HAND );  Surgeon: Roseanne Kaufman, MD;  Location: [place];  Service: Orthopedics;  Laterality: Right;  skin graft from right thigh  . THROAT SURGERY    . THROAT SURGERY  02/23/2018  . TOTAL ABDOMINAL HYSTERECTOMY    . TUBAL LIGATION       OB History   No obstetric history on file.      Home Medications    Prior to Admission medications   Medication Sig Start Date End Date Taking? Authorizing Provider  ALPRAZolam Duanne Moron) 1 MG tablet Take 1 tablet (1 mg total) by mouth 2 (two) times daily as needed for anxiety. 08/10/13   Avelina Laine, PA-C  BIOTIN 5000 PO Take 2 tablets by mouth daily.    [provider]  furosemide (LASIX) 20 MG tablet Take 20 mg by mouth daily as needed for fluid or edema.  09/28/14   [provider]  gabapentin (NEURONTIN) 300 MG capsule as needed 01/23/16   [provider]  hydrochlorothiazide (HYDRODIURIL) 25 MG tablet Take 25 mg by mouth daily.    [provider]  LYRICA 150 MG capsule TK ONE C PO TID 01/11/16   [provider]  oxycodone (ROXICODONE) 30 MG immediate release tablet Take 30 mg by mouth 4 (four) times daily.     [provider]  pantoprazole (PROTONIX) 40 MG tablet Take 40 mg by mouth 2 (two) times daily.    [provider]  tiZANidine (ZANAFLEX) 4 MG capsule Take 4 mg by mouth as needed for muscle spasms.    [provider]    Family History Family History  Problem Relation Age of Onset  . Coronary artery disease Father   . Hypertension Father   . Diabetes Father   . Cancer Father   . Heart disease Father   . Diabetes type II Mother   . Alcohol abuse Mother   . COPD Mother   .  Diabetes Sister   . Heart disease Sister     Social History Social History   Tobacco Use  . Smoking status: Current Every Day Smoker    Packs/day: 0.25    Years: 40.00    Pack years: 10.00    Types: Cigarettes  . Smokeless tobacco: Never Used  Substance Use Topics  . Alcohol use: No  . 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   Allergies   Asa [aspirin]; Vicodin [  hydrocodone-acetaminophen]; Hydrocodone-acetaminophen; and Hydrocodone-acetaminophen   Review of Systems Review of Systems  All other systems reviewed and are negative.    Physical Exam Updated Vital Signs BP (!) 145/63 (BP Location: Left Arm)   Pulse 70   Temp 99.8 F (37.7 C) (Oral)   Resp 16   Ht 5\' 4"  (1.626 m)   Wt 95.7 kg   LMP 09/16/2000   SpO2 98%   BMI 36.22 kg/m   Vital signs normal    Physical Exam Vitals signs and nursing note reviewed.  Constitutional:      Appearance: Normal appearance.     Comments: Patient is smiling when she arrived by EMS and saying Angela Nevin Christmas to everybody.  She does not appear painful until I start to talk to her in the room.  HENT:     Head: Normocephalic and atraumatic.     Right Ear: External ear normal.     Left Ear: External ear normal.     Nose: Nose normal.  Eyes:     Extraocular Movements: Extraocular movements intact.     Conjunctiva/sclera: Conjunctivae normal.  Neck:     Musculoskeletal: Normal range of motion.  Cardiovascular:     Rate and Rhythm: Normal rate.  Pulmonary:     Effort: Pulmonary effort is normal. No respiratory distress.  Musculoskeletal:     Comments: Patient states she has chronic pain in her knee and there is no new pain since her injury tonight.  I palpate the rest of her leg and she has diffuse tenderness to both the medial and lateral malleolus however there is [place] no swelling seen at all.  Her foot is diffusely  tender without localization again with no swelling.  There is no abrasions seen.  Skin:    General: Skin is warm and dry.  Neurological:     General: No focal deficit present.     Mental Status: She is alert and oriented to person, place, and time.  Psychiatric:        Mood and Affect: Affect is labile.        Speech: Speech is rapid and pressured.        Behavior: Behavior is agitated.      ED Treatments / Results  Labs (all labs ordered are listed, but only abnormal results are displayed) Labs Reviewed - No data to display  EKG None  Radiology Dg Ankle Complete Left  Result Date: 04/14/2018 CLINICAL DATA:  Twisting injury to the left foot. EXAM: LEFT ANKLE COMPLETE - 3+ VIEW COMPARISON:  None. FINDINGS: There is no evidence of fracture, dislocation, or joint effusion. There is no evidence of arthropathy or other focal bone abnormality. Plantar calcaneal spur. Soft tissues are unremarkable. IMPRESSION: Negative. Electronically Signed   By: Lucienne Capers M.D.   On: 04/14/2018 05:05   Dg Foot Complete Left  Result Date: 04/14/2018 CLINICAL DATA:  Twisting injury to the left foot. EXAM: LEFT FOOT - COMPLETE 3+ VIEW COMPARISON:  None. FINDINGS: There is no evidence of fracture or dislocation. There is no evidence of arthropathy or other focal bone abnormality. Soft tissues are unremarkable. IMPRESSION: Negative. Electronically Signed   By: Lucienne Capers M.D.   On: 04/14/2018 04:12    Procedures Procedures (including critical care time)  Medications Ordered in ED Medications  ketorolac (TORADOL) injection 30 mg (30 mg Intramuscular Given 04/14/18 0453)     Initial Impression / Assessment and Plan / ED Course  I have reviewed the triage vital signs  and the nursing notes.  Pertinent labs & imaging results that were available during my care of the patient were reviewed by me and considered in my medical decision making (see chart for details).     Patient is very  dramatic about her degree of pain.  She states she has not run out of her pain medication.  The nurses had x-rayed her foot and I added an ankle x-ray.  When it returned and was also normal she was informed.  I offered her a ASO and crutches.  She was given a Toradol injection.  At this point I told her I had nothing else I could offer her.  Final Clinical Impressions(s) / ED Diagnoses   Final diagnoses:  Acute left ankle pain    ED Discharge Orders    None     Plan discharge  Rolland Porter, MD, Barbette Or, MD 04/14/18 0600

## 2018-04-14 NOTE — ED Triage Notes (Signed)
Pt was leaving an unfamiliar house and stepped into a hole with twisted the left foot. No noticeable swelling.

## 2018-04-14 NOTE — Discharge Instructions (Addendum)
Elevate your leg, use ice packs for comfort.  Wear the ankle support for comfort.  Use the crutches until you are able to walk on that leg.  You can call Dr. Ruthe Mannan office to get a follow-up appointment to evaluate the pain in your leg.  You have pain medicine you can take at home.  If you have run out of your pain medications please contact Dr. Herbert Pun

## 2018-06-07 ENCOUNTER — Ambulatory Visit (INDEPENDENT_AMBULATORY_CARE_PROVIDER_SITE_OTHER): Payer: Medicaid Other | Admitting: Internal Medicine

## 2018-06-07 ENCOUNTER — Encounter (INDEPENDENT_AMBULATORY_CARE_PROVIDER_SITE_OTHER): Payer: Self-pay | Admitting: Internal Medicine

## 2018-06-07 VITALS — BP 144/80 | HR 64 | Temp 98.1°F | Ht 64.0 in | Wt 211.8 lb

## 2018-06-07 DIAGNOSIS — K219 Gastro-esophageal reflux disease without esophagitis: Secondary | ICD-10-CM

## 2018-06-07 DIAGNOSIS — R1013 Epigastric pain: Secondary | ICD-10-CM

## 2018-06-07 MED ORDER — SUCRALFATE 1 G PO TABS
1.0000 g | ORAL_TABLET | Freq: Three times a day (TID) | ORAL | 3 refills | Status: DC
Start: 2018-06-07 — End: 2019-01-11

## 2018-06-07 NOTE — Patient Instructions (Signed)
Rx for Carafate sent to her pharmacy 

## 2018-06-07 NOTE — Progress Notes (Signed)
Subjective:    Patient ID: [name], female    DOB: [birth year], 58 y.o.   MRN: [id number]  HPI Presents today with c/o of epigastric tenderness. She says her stools are not black.  When she wakes up, she has epigastric tenderness. Feels a burning sensation in there epigastric region.  Symptoms x 4 weeks.     Review of Systems Past Medical History:  Diagnosis Date  . Allergic rhinitis   . Anxiety and depression   . Cervical cancer ([place])    in her 69's  . Chronic back pain   . Chronic chest wall pain   . Chronic pain syndrome   . Compartment syndrome ([place])   . DJD (degenerative joint disease) of lumbar spine   . Fibromyalgia   . GI bleed   . Gout   . Hyperlipidemia   . Osteoarthritis   . Pain management   . PUD (peptic ulcer disease)   . Vaginal Pap smear, abnormal 10-29-14   ASCUS negative HPV    Past Surgical History:  Procedure Laterality Date  . APPLICATION OF WOUND VAC Right 08/04/2013   Procedure: APPLICATION OF WOUND VAC;  Surgeon: Roseanne Kaufman, MD;  Location: [place];  Service: Orthopedics;  Laterality: Right;  . Cervival cone    . FASCIOTOMY Right 08/04/2013   Procedure: FASCIOTOMY;  Surgeon: Roseanne Kaufman, MD;  Location: [place];  Service: Orthopedics;  Laterality: Right;  . I&D EXTREMITY Right 08/04/2013   Procedure: IRRIGATION AND DEBRIDEMENT RIGHT HAND AND FOREARM;  Surgeon: Roseanne Kaufman, MD;  Location: [place];  Service: Orthopedics;  Laterality: Right;  . I&D EXTREMITY Right 08/05/2013   Procedure: IRRIGATION AND DEBRIDEMENT RIGHT HAND AND FOREARM with SECONDARY CLOSURE ;  Surgeon: Roseanne Kaufman, MD;  Location: [place];  Service: Orthopedics;  Laterality: Right;  . I&D EXTREMITY Right 08/07/2013   Procedure: IRRIGATION AND DEBRIDEMENT RIGHT ARM, FOREARM, HAND AND SKIN GRAFTING AS NECESSARY TO AFECTED AREAS (FOREARM AND HAND );  Surgeon: Roseanne Kaufman, MD;  Location: [place];  Service: Orthopedics;  Laterality: Right;  graft from right thigh to arm  . SKIN FULL  THICKNESS GRAFT Right 08/07/2013   Procedure: AND SKIN GRAFTING AS NECESSARY TO AFECTED AREAS (FOREARM AND HAND );  Surgeon: Roseanne Kaufman, MD;  Location: [place];  Service: Orthopedics;  Laterality: Right;  skin graft from right thigh  . THROAT SURGERY    . THROAT SURGERY  02/23/2018  . TOTAL ABDOMINAL HYSTERECTOMY    . TUBAL LIGATION      Allergies  Allergen Reactions  . Asa [Aspirin] Other (See Comments)    Careful has hx of bleeding ulcers Cannot take due to hx of bleeding ulcers  . Vicodin [Hydrocodone-Acetaminophen] Itching  . Hydrocodone-Acetaminophen Rash  . Hydrocodone-Acetaminophen Rash    Current Outpatient Medications on File Prior to Visit  Medication Sig Dispense Refill  . ALPRAZolam (XANAX) 1 MG tablet Take 1 tablet (1 mg total) by mouth 2 (two) times daily as needed for anxiety. 60 tablet 0  . furosemide (LASIX) 20 MG tablet Take 20 mg by mouth daily as needed for fluid or edema.     . hydrochlorothiazide (HYDRODIURIL) 25 MG tablet Take 25 mg by mouth daily.    Marland Kitchen oxycodone (ROXICODONE) 30 MG immediate release tablet Take 30 mg by mouth 4 (four) times daily.     . pantoprazole (PROTONIX) 40 MG tablet Take 40 mg by mouth 2 (two) times daily.     No current facility-administered medications on  file prior to visit.         Objective:   Physical Exam Blood pressure (!) 144/80, pulse 64, temperature 98.1 F (36.7 C), height 5\' 4"  (1.626 m), weight 211 lb 12.8 oz (96.1 kg), last menstrual period 09/16/2000. Alert and oriented. Skin warm and dry. Oral mucosa is moist.   . Sclera anicteric, conjunctivae is pink. Thyroid not enlarged. No cervical lymphadenopathy. Lungs clear. Heart regular rate and rhythm.  Abdomen is soft. Bowel sounds are positive. No hepatomegaly. No abdominal masses felt.Epigastric tenderness.  No edema to lower extremities.       Assessment & Plan:  Epigastric tenderness. Am going to call rx in For Carafate.  US abdomen complete.

## 2018-06-14 ENCOUNTER — Ambulatory Visit (HOSPITAL_COMMUNITY): Admission: RE | Admit: 2018-06-14 | Payer: Medicaid Other | Source: Ambulatory Visit

## 2018-06-15 ENCOUNTER — Ambulatory Visit (HOSPITAL_COMMUNITY)
Admission: RE | Admit: 2018-06-15 | Discharge: 2018-06-15 | Disposition: A | Discharge status: Home / Self Care | Payer: Medicaid Other | Source: Ambulatory Visit | Attending: Internal Medicine | Admitting: Internal Medicine

## 2018-06-15 DIAGNOSIS — R1013 Epigastric pain: Secondary | ICD-10-CM | POA: Diagnosis present

## 2018-06-20 ENCOUNTER — Encounter (INDEPENDENT_AMBULATORY_CARE_PROVIDER_SITE_OTHER): Payer: Self-pay | Admitting: *Deleted

## 2018-10-12 ENCOUNTER — Other Ambulatory Visit (HOSPITAL_COMMUNITY): Payer: Self-pay | Admitting: Family Medicine

## 2018-10-12 DIAGNOSIS — Z1231 Encounter for screening mammogram for malignant neoplasm of breast: Secondary | ICD-10-CM

## 2018-10-18 ENCOUNTER — Ambulatory Visit (HOSPITAL_COMMUNITY): Payer: Medicaid Other

## 2018-12-21 ENCOUNTER — Telehealth (INDEPENDENT_AMBULATORY_CARE_PROVIDER_SITE_OTHER): Payer: Self-pay | Admitting: Nurse Practitioner

## 2018-12-21 NOTE — Telephone Encounter (Signed)
Patient called stated [name] had prescribed Carafate - stated the medication is not doing any good - please call her at [phone]

## 2018-12-26 ENCOUNTER — Other Ambulatory Visit (INDEPENDENT_AMBULATORY_CARE_PROVIDER_SITE_OTHER): Payer: Self-pay | Admitting: Nurse Practitioner

## 2018-12-26 DIAGNOSIS — R1013 Epigastric pain: Secondary | ICD-10-CM

## 2018-12-26 DIAGNOSIS — K219 Gastro-esophageal reflux disease without esophagitis: Secondary | ICD-10-CM

## 2018-12-26 MED ORDER — FAMOTIDINE 20 MG PO TABS: 20.0000 mg | ORAL_TABLET | Freq: Two times a day (BID) | ORAL | 1 refills | Status: AC

## 2018-12-26 NOTE — Telephone Encounter (Signed)
She complains of have stomach pain after she eats x 2 weeks. No dysphagia. No heartburn. Pantoprazole 40mg  bid. She is taking Carafate 2 tabs bid without improvement, remote hx of lg gastric ulcers. She is constipated from the Carafate. I advised pt to continue Pantoprazole 40mg  bid. Stop Carafate. Add Pepcid 20mg  one tab bid. Pt has an ov scheduled in 2 weeks. She will call office if her sx worsen.

## 2019-01-11 ENCOUNTER — Encounter (INDEPENDENT_AMBULATORY_CARE_PROVIDER_SITE_OTHER): Payer: Self-pay | Admitting: Nurse Practitioner

## 2019-01-11 ENCOUNTER — Ambulatory Visit (INDEPENDENT_AMBULATORY_CARE_PROVIDER_SITE_OTHER): Payer: Medicaid Other | Admitting: Nurse Practitioner

## 2019-01-11 ENCOUNTER — Other Ambulatory Visit: Payer: Self-pay

## 2019-01-11 VITALS — BP 135/80 | HR 65 | Temp 98.1°F | Ht 64.0 in | Wt 202.8 lb

## 2019-01-11 DIAGNOSIS — R140 Abdominal distension (gaseous): Secondary | ICD-10-CM | POA: Diagnosis not present

## 2019-01-11 DIAGNOSIS — K5900 Constipation, unspecified: Secondary | ICD-10-CM | POA: Diagnosis not present

## 2019-01-11 DIAGNOSIS — R1010 Upper abdominal pain, unspecified: Secondary | ICD-10-CM | POA: Diagnosis not present

## 2019-01-11 NOTE — Progress Notes (Signed)
Subjective:    Patient ID: [name], female    DOB: Dec 27, 1960, 58 y.o.   MRN: [id number]  HPI [name] is a 58 y.o. female with a past medial history of cervical cancer,  fibromyalgia, chronic back pain, PUD with GI bleed in her 20 yrs ago. She presents today for GERD follow. She is taking Pantoprazole 40mg  bid. No heartburn or dysphagia. She complains of abdominal swelling which comes and goes, worse after she eats. No specific food triggers. She complains of having central upper abdominal pain that awakens her from sleelp 7 nights over the past 2 weeks. She describes her epigastric pain feels like a hole burning through to the back. Some nausea. No vomiting. She is passing a solid darker brown stools, not black. She has intermittent constipation and takes Movantik PRN. Her stress level is high, her father is under hospice care at this time. No NSAID use. No alcohol use. History of colitis in 2006, ? due to NSAID use. See results below. No family history of colon cancer. .  EGD 02/23/2010 by Dr. Gala Romney: EGD photo report in [place], written EGD results not found in chart   EGD by Dr. Gala Romney 03/01/2005: 1.  Normal esophagus.  2.  Antral erosions, otherwise normal stomach.  3.  Patent pylorus.  4.  Normal bulbar erosions with a 6-mm bulbar ulcer, otherwise D2, D2      appeared normal.  Colonoscopy by Dr. Gala Romney 02/25/2005: 1.  Internal hemorrhoids. Otherwise normal rectum.  2.  Sigmoid diverticula.  3.  Scattered areas of ulceration from the splenic flexure all the way to      the cecum. Normal terminal ileum.  Biopsy results: 1) ulcers (splenic flexure, biopsy):   MINUTE SUPERFICIAL FRAGMENT OF BENIGN COLONIC EPITHELIUM. NO   INCREASED INFLAMMATION OF EVIDENCE OF ULCERATION IDENTIFIED.  2) ulcers (desending colon, biopsy):   MILD CHRONIC ACTIVE INFLAMMATION WITH FOCAL EDEMA AND FIBROSIS IN   THE LAMINA PROPRIA.   Past Medical History:  Diagnosis Date  . Allergic  rhinitis   . Anxiety and depression   . Cervical cancer ([place])    in her 48's  . Chronic back pain   . Chronic chest wall pain   . Chronic pain syndrome   . Compartment syndrome ([place])   . DJD (degenerative joint disease) of lumbar spine   . Fibromyalgia   . GI bleed   . Gout   . Hyperlipidemia   . Osteoarthritis   . Pain management   . PUD (peptic ulcer disease)   . Vaginal Pap smear, abnormal 10-29-14   ASCUS negative HPV   Past Surgical History:  Procedure Laterality Date  . APPLICATION OF WOUND VAC Right 08/04/2013   Procedure: APPLICATION OF WOUND VAC;  Surgeon: Roseanne Kaufman, MD;  Location: [place];  Service: Orthopedics;  Laterality: Right;  . Cervival cone    . FASCIOTOMY Right 08/04/2013   Procedure: FASCIOTOMY;  Surgeon: Roseanne Kaufman, MD;  Location: [place];  Service: Orthopedics;  Laterality: Right;  . I&D EXTREMITY Right 08/04/2013   Procedure: IRRIGATION AND DEBRIDEMENT RIGHT HAND AND FOREARM;  Surgeon: Roseanne Kaufman, MD;  Location: [place];  Service: Orthopedics;  Laterality: Right;  . I&D EXTREMITY Right 08/05/2013   Procedure: IRRIGATION AND DEBRIDEMENT RIGHT HAND AND FOREARM with SECONDARY CLOSURE ;  Surgeon: Roseanne Kaufman, MD;  Location: [place];  Service: Orthopedics;  Laterality: Right;  . I&D EXTREMITY Right 08/07/2013   Procedure: IRRIGATION AND DEBRIDEMENT RIGHT ARM, FOREARM,  HAND AND SKIN GRAFTING AS NECESSARY TO AFECTED AREAS (FOREARM AND HAND );  Surgeon: Roseanne Kaufman, MD;  Location: [place];  Service: Orthopedics;  Laterality: Right;  graft from right thigh to arm  . SKIN FULL THICKNESS GRAFT Right 08/07/2013   Procedure: AND SKIN GRAFTING AS NECESSARY TO AFECTED AREAS (FOREARM AND HAND );  Surgeon: Roseanne Kaufman, MD;  Location: [place];  Service: Orthopedics;  Laterality: Right;  skin graft from right thigh  . THROAT SURGERY    . THROAT SURGERY  02/23/2018  . TOTAL ABDOMINAL HYSTERECTOMY    . TUBAL LIGATION     Current Outpatient Medications on File Prior to Visit   Medication Sig Dispense Refill  . ALPRAZolam (XANAX) 1 MG tablet Take 1 tablet (1 mg total) by mouth 2 (two) times daily as needed for anxiety. 60 tablet 0  . famotidine (PEPCID) 20 MG tablet Take 1 tablet (20 mg total) by mouth 2 (two) times daily. Take one tab by mouth after dinner and one tab at bed time 60 tablet 1  . furosemide (LASIX) 20 MG tablet Take 20 mg by mouth daily as needed for fluid or edema.     . hydrochlorothiazide (HYDRODIURIL) 25 MG tablet Take 25 mg by mouth daily.    Marland Kitchen oxycodone (ROXICODONE) 30 MG immediate release tablet Take 30 mg by mouth 3 (three) times daily.    . pantoprazole (PROTONIX) 40 MG tablet Take 40 mg by mouth 2 (two) times daily.     No current facility-administered medications on file prior to visit.    Allergies  Allergen Reactions  . Asa [Aspirin] Other (See Comments)    Careful has hx of bleeding ulcers Cannot take due to hx of bleeding ulcers  . Vicodin [Hydrocodone-Acetaminophen] Itching  . Hydrocodone-Acetaminophen Rash  . Hydrocodone-Acetaminophen Rash   Review of Systems See HPI, all other systems reviewed and are negative     Objective:   Physical Exam Blood pressure 135/80, pulse 65, temperature 98.1 F (36.7 C), height 5\' 4"  (1.626 m), weight 202 lb 12.8 oz (92 kg), last menstrual period 09/16/2000. General: 58 y.o. female alert in NAD Eyes: sclera nonicteric, conjunctiva pink Mouth: dentition is intact, no ulcers or lestions Neck: supple, no lymphadenopathy or thyromegaly Heart: RRR, no murmurs Lungs: breath sounds clear throughout Abdomen: epigastric tenderness without rebound or guarding, + BS x 4 quads, no HSM Extremities: no edema Neuro: alert and oriented x 4, no focal deficits     Assessment & Plan:   39. 58 year old female with a past hx of PUD presents with epigastric pain that awakens her from sleep in setting of heightened stress -CBC, CMP, CRP and Lipase -abdominal sonogram to evaluate the gallbladder -EGD  benefits and risks discussed including risk with sedation, risk of perforation, bleeding and infection -Continue Protonix 40mg  bid. Pepcid 20mg  bid.  -Patient to go to ER if severe abdominal pain develops -If abd sono and EGD negative to consider CCK HIDA if she continues to have epigastric pain that awakens her from sleep which is suggestive of biliary origin   2. Colon cancer screening -schedule a colonoscopy at the time of her EGD, benefits and risks discussed    3. Hepatic steatosis per CTAP 11/07/2015 -check CMP  further follow up to be determined after EGD and colonoscopy completed.

## 2019-01-11 NOTE — Patient Instructions (Signed)
1. Schedule an abdominal ultrasound  2. Complete the lab order   3. Schedule EGD and colonoscopy   4. Continue Pantoprazole and Pepcid (Famotidine) as prescribed

## 2019-01-13 LAB — CBC WITH DIFFERENTIAL/PLATELET
Absolute Monocytes: 501 cells/uL (ref 200–950)
Basophils Absolute: 46 cells/uL (ref 0–200)
Basophils Relative: 0.6 %
Eosinophils Absolute: 239 cells/uL (ref 15–500)
Eosinophils Relative: 3.1 %
HCT: 44.1 % (ref 35.0–45.0)
Hemoglobin: 14.6 g/dL (ref 11.7–15.5)
Lymphs Abs: 2395 cells/uL (ref 850–3900)
MCH: 28.5 pg (ref 27.0–33.0)
MCHC: 33.1 g/dL (ref 32.0–36.0)
MCV: 86 fL (ref 80.0–100.0)
MPV: 10.9 fL (ref 7.5–12.5)
Monocytes Relative: 6.5 %
Neutro Abs: 4520 cells/uL (ref 1500–7800)
Neutrophils Relative %: 58.7 %
Platelets: 300 10*3/uL (ref 140–400)
RBC: 5.13 10*6/uL — ABNORMAL HIGH (ref 3.80–5.10)
RDW: 14.8 % (ref 11.0–15.0)
Total Lymphocyte: 31.1 %
WBC: 7.7 10*3/uL (ref 3.8–10.8)

## 2019-01-13 LAB — COMPLETE METABOLIC PANEL WITH GFR
AG Ratio: 1.5 (calc) (ref 1.0–2.5)
ALT: 13 U/L (ref 6–29)
AST: 14 U/L (ref 10–35)
Albumin: 4.1 g/dL (ref 3.6–5.1)
Alkaline phosphatase (APISO): 60 U/L (ref 37–153)
BUN: 15 mg/dL (ref 7–25)
CO2: 30 mmol/L (ref 20–32)
Calcium: 9.5 mg/dL (ref 8.6–10.4)
Chloride: 104 mmol/L (ref 98–110)
Creat: 0.94 mg/dL (ref 0.50–0.99)
GFR, Est African American: 76 mL/min/{1.73_m2} (ref >=60)
GFR, Est Non African American: 66 mL/min/{1.73_m2} (ref >=60)
Globulin: 2.7 g/dL (calc) (ref 1.9–3.7)
Glucose, Bld: 98 mg/dL (ref 65–139)
Potassium: 3.9 mmol/L (ref 3.5–5.3)
Sodium: 142 mmol/L (ref 135–146)
Total Bilirubin: 0.3 mg/dL (ref 0.2–1.2)
Total Protein: 6.8 g/dL (ref 6.1–8.1)

## 2019-01-13 LAB — C-REACTIVE PROTEIN: CRP: 12.9 mg/L — ABNORMAL HIGH (ref <8.0)

## 2019-01-13 LAB — LIPASE: Lipase: 21 U/L (ref 7–60)

## 2019-01-16 ENCOUNTER — Encounter (INDEPENDENT_AMBULATORY_CARE_PROVIDER_SITE_OTHER): Payer: Self-pay | Admitting: *Deleted

## 2019-01-16 ENCOUNTER — Telehealth (INDEPENDENT_AMBULATORY_CARE_PROVIDER_SITE_OTHER): Payer: Self-pay | Admitting: *Deleted

## 2019-01-16 DIAGNOSIS — K5900 Constipation, unspecified: Secondary | ICD-10-CM

## 2019-01-16 MED ORDER — SUPREP BOWEL PREP KIT 17.5-3.13-1.6 GM/177ML PO SOLN: 1.0000 | Freq: Once | ORAL | 0 refills | Status: AC

## 2019-01-16 NOTE — Telephone Encounter (Signed)
Patient needs suprep TCS/EGD sch'd 10/30

## 2019-01-19 ENCOUNTER — Ambulatory Visit (HOSPITAL_COMMUNITY)
Admission: RE | Admit: 2019-01-19 | Discharge: 2019-01-19 | Disposition: A | Discharge status: Home / Self Care | Payer: Medicaid Other | Source: Ambulatory Visit | Attending: Nurse Practitioner | Admitting: Nurse Practitioner

## 2019-01-19 ENCOUNTER — Other Ambulatory Visit: Payer: Self-pay

## 2019-01-19 DIAGNOSIS — R140 Abdominal distension (gaseous): Secondary | ICD-10-CM | POA: Diagnosis present

## 2019-01-19 DIAGNOSIS — R1010 Upper abdominal pain, unspecified: Secondary | ICD-10-CM | POA: Diagnosis not present

## 2019-02-21 ENCOUNTER — Encounter (HOSPITAL_COMMUNITY): Payer: Medicaid Other

## 2019-02-21 ENCOUNTER — Other Ambulatory Visit (HOSPITAL_COMMUNITY): Payer: Medicaid Other

## 2019-02-23 ENCOUNTER — Encounter (HOSPITAL_COMMUNITY): Payer: Self-pay

## 2019-02-23 ENCOUNTER — Ambulatory Visit (HOSPITAL_COMMUNITY): Admit: 2019-02-23 | Payer: Medicaid Other | Admitting: Internal Medicine

## 2019-02-23 SURGERY — ESOPHAGOGASTRODUODENOSCOPY (EGD) WITH PROPOFOL
Anesthesia: Monitor Anesthesia Care

## 2020-01-14 ENCOUNTER — Ambulatory Visit: Payer: Medicaid Other | Admitting: Orthopedic Surgery

## 2020-01-14 ENCOUNTER — Encounter: Payer: Self-pay | Admitting: Orthopedic Surgery

## 2020-12-15 ENCOUNTER — Other Ambulatory Visit (HOSPITAL_COMMUNITY): Payer: Self-pay | Admitting: Adult Health

## 2020-12-15 DIAGNOSIS — Z1231 Encounter for screening mammogram for malignant neoplasm of breast: Secondary | ICD-10-CM

## 2021-01-05 ENCOUNTER — Inpatient Hospital Stay (HOSPITAL_COMMUNITY): Admission: RE | Admit: 2021-01-05 | Payer: Medicaid Other | Source: Ambulatory Visit

## 2021-01-19 ENCOUNTER — Ambulatory Visit (HOSPITAL_COMMUNITY): Payer: Medicaid Other

## 2022-01-29 ENCOUNTER — Encounter: Payer: Self-pay | Admitting: Internal Medicine

## 2022-01-29 ENCOUNTER — Ambulatory Visit: Payer: Medicaid Other | Admitting: Internal Medicine

## 2022-02-17 NOTE — H&P (Signed)
Surgical History & Physical  Patient Name: [name] DOB: [birth year]  Surgery: Cataract extraction with intraocular lens implant phacoemulsification; Left Eye  Surgeon: Baruch Goldmann MD Surgery Date:  03-08-22 Pre-Op Date:  02-08-22  HPI: A 31 Yr. old female patient is referred by Dr Jorja Loa for cataract eval. 1. 1. The patient complains of blurry vision, which began 1 year ago. Both eyes are affected. The episode is gradual. The condition's severity increased since last visit. Symptoms occur when the patient is driving and outside. The complaint is associated with glare, difficulty recognizing people at a distance and seeing small captions on TV. This is negatively affecting the patient's quality of life and the patient is unable to function adequately in life with the current level of vision. HPI was performed by Baruch Goldmann .  Medical History: Fibromyalgia High Blood Pressure LDL Lung Problems  Review of Systems Negative Allergic/Immunologic Negative Cardiovascular Negative Constitutional Negative Ear, Nose, Mouth & Throat Negative Endocrine Negative Eyes Negative Gastrointestinal Negative Genitourinary Negative Hemotologic/Lymphatic Negative Integumentary Negative Musculoskeletal Negative Neurological Negative Psychiatry Negative Respiratory  Social   Current every day smoker   Medication  Cymbalta, HCTZ, Atorvastatin, Singulair,   Sx/Procedures  None  Drug Allergies  Codeine,   History & Physical: Heent: Cataract, left eye NECK: supple without bruits LUNGS: lungs clear to auscultation CV: regular rate and rhythm Abdomen: soft and non-tender Impression & Plan: Assessment: 1.  COMBINED FORMS AGE RELATED CATARACT; Both Eyes (H25.813) 2.  BLEPHARITIS; Right Upper Lid, Right Lower Lid, Left Upper Lid, Left Lower Lid (H01.001, H01.002,H01.004,H01.005) 3.  DERMATOCHALASIS, no surgery; Right Upper Lid, Left Upper Lid (H02.831, H02.834) 4.  Myopia ; Both  Eyes (H52.13) 5.  ASTIGMATISM, REGULAR; Both Eyes (H52.223)  Plan: 1.  Cataract accounts for the patient's decreased vision. This visual impairment is not correctable with a tolerable change in glasses or contact lenses. Cataract surgery with an implantation of a new lens should significantly improve the visual and functional status of the patient. Discussed all risks, benefits, alternatives, and potential complications. Discussed the procedures and recovery. Patient desires to have surgery. A-scan ordered and performed today for intra-ocular lens calculations. The surgery will be performed in order to improve vision for driving, reading, and for eye examinations. Recommend phacoemulsification with intra-ocular lens. Recommend Dextenza for post-operative pain and inflammation. Left Eye -First. Surgery required to correct imbalance of vision. Dilates well - shugarcaine by protocol. Declines toric.  2.  Recommended regular lid cleaning.  3.  Asymptomatic, recommend observation for now. Findings, prognosis and treatment options reviewed.  4.  Explained near-sightedness to patient. No glasses needed at this time.  5.  Declines toric IOL.

## 2022-03-03 ENCOUNTER — Encounter (HOSPITAL_COMMUNITY)
Admission: RE | Admit: 2022-03-03 | Discharge: 2022-03-03 | Disposition: A | Discharge status: Home / Self Care | Payer: Medicaid Other | Source: Ambulatory Visit | Attending: Ophthalmology | Admitting: Ophthalmology

## 2022-03-03 ENCOUNTER — Encounter (HOSPITAL_COMMUNITY): Payer: Self-pay

## 2022-03-03 ENCOUNTER — Other Ambulatory Visit: Payer: Self-pay

## 2022-03-03 HISTORY — DX: Chronic obstructive pulmonary disease, unspecified: J44.9

## 2022-03-03 HISTORY — DX: Gastro-esophageal reflux disease without esophagitis: K21.9

## 2022-03-08 ENCOUNTER — Encounter (HOSPITAL_COMMUNITY)
Admission: RE | Disposition: A | Discharge status: Home / Self Care | Payer: Self-pay | Source: Home / Self Care | Attending: Ophthalmology

## 2022-03-08 ENCOUNTER — Ambulatory Visit (HOSPITAL_COMMUNITY)
Admission: RE | Admit: 2022-03-08 | Discharge: 2022-03-08 | Disposition: A | Discharge status: Home / Self Care | Payer: Medicaid Other | Attending: Ophthalmology | Admitting: Ophthalmology

## 2022-03-08 ENCOUNTER — Ambulatory Visit (HOSPITAL_BASED_OUTPATIENT_CLINIC_OR_DEPARTMENT_OTHER): Payer: Medicaid Other | Admitting: Certified Registered Nurse Anesthetist

## 2022-03-08 ENCOUNTER — Encounter (HOSPITAL_COMMUNITY): Payer: Self-pay | Admitting: Ophthalmology

## 2022-03-08 ENCOUNTER — Ambulatory Visit (HOSPITAL_COMMUNITY): Payer: Medicaid Other | Admitting: Certified Registered Nurse Anesthetist

## 2022-03-08 DIAGNOSIS — I10 Essential (primary) hypertension: Secondary | ICD-10-CM | POA: Insufficient documentation

## 2022-03-08 DIAGNOSIS — H02831 Dermatochalasis of right upper eyelid: Secondary | ICD-10-CM | POA: Diagnosis not present

## 2022-03-08 DIAGNOSIS — F17210 Nicotine dependence, cigarettes, uncomplicated: Secondary | ICD-10-CM | POA: Diagnosis not present

## 2022-03-08 DIAGNOSIS — H5213 Myopia, bilateral: Secondary | ICD-10-CM | POA: Insufficient documentation

## 2022-03-08 DIAGNOSIS — F418 Other specified anxiety disorders: Secondary | ICD-10-CM | POA: Insufficient documentation

## 2022-03-08 DIAGNOSIS — J449 Chronic obstructive pulmonary disease, unspecified: Secondary | ICD-10-CM

## 2022-03-08 DIAGNOSIS — H25812 Combined forms of age-related cataract, left eye: Secondary | ICD-10-CM

## 2022-03-08 DIAGNOSIS — H01004 Unspecified blepharitis left upper eyelid: Secondary | ICD-10-CM | POA: Diagnosis not present

## 2022-03-08 DIAGNOSIS — H01001 Unspecified blepharitis right upper eyelid: Secondary | ICD-10-CM | POA: Diagnosis not present

## 2022-03-08 DIAGNOSIS — H01005 Unspecified blepharitis left lower eyelid: Secondary | ICD-10-CM | POA: Insufficient documentation

## 2022-03-08 DIAGNOSIS — H01002 Unspecified blepharitis right lower eyelid: Secondary | ICD-10-CM | POA: Insufficient documentation

## 2022-03-08 DIAGNOSIS — H52223 Regular astigmatism, bilateral: Secondary | ICD-10-CM | POA: Diagnosis not present

## 2022-03-08 DIAGNOSIS — F319 Bipolar disorder, unspecified: Secondary | ICD-10-CM | POA: Diagnosis not present

## 2022-03-08 DIAGNOSIS — H02834 Dermatochalasis of left upper eyelid: Secondary | ICD-10-CM | POA: Insufficient documentation

## 2022-03-08 HISTORY — PX: CATARACT EXTRACTION W/PHACO: SHX586

## 2022-03-08 SURGERY — PHACOEMULSIFICATION, CATARACT, WITH IOL INSERTION
Anesthesia: Monitor Anesthesia Care | Site: Eye | Laterality: Left

## 2022-03-08 MED ORDER — STERILE WATER FOR IRRIGATION IR SOLN
Status: DC | PRN
  Administered 2022-03-08: 250 mL

## 2022-03-08 MED ORDER — SODIUM HYALURONATE 10 MG/ML IO SOLUTION
PREFILLED_SYRINGE | INTRAOCULAR | Status: DC | PRN
  Administered 2022-03-08: .85 mL via INTRAOCULAR

## 2022-03-08 MED ORDER — LIDOCAINE HCL (PF) 1 % IJ SOLN
INTRAOCULAR | Status: DC | PRN
  Administered 2022-03-08: 1 mL via OPHTHALMIC

## 2022-03-08 MED ORDER — PHENYLEPHRINE HCL 2.5 % OP SOLN
1.0000 [drp] | OPHTHALMIC | Status: AC | PRN
  Administered 2022-03-08 (×3): 1 [drp] via OPHTHALMIC

## 2022-03-08 MED ORDER — SODIUM HYALURONATE 23MG/ML IO SOSY
PREFILLED_SYRINGE | INTRAOCULAR | Status: DC | PRN
  Administered 2022-03-08: .6 mL via INTRAOCULAR

## 2022-03-08 MED ORDER — MIDAZOLAM HCL 5 MG/5ML IJ SOLN
INTRAMUSCULAR | Status: DC | PRN
  Administered 2022-03-08: 1 mg via INTRAVENOUS

## 2022-03-08 MED ORDER — LIDOCAINE HCL 3.5 % OP GEL
1.0000 | Freq: Once | OPHTHALMIC | Status: AC
  Administered 2022-03-08: 1 via OPHTHALMIC

## 2022-03-08 MED ORDER — POVIDONE-IODINE 5 % OP SOLN
OPHTHALMIC | Status: DC | PRN
  Administered 2022-03-08: 1 via OPHTHALMIC

## 2022-03-08 MED ORDER — MOXIFLOXACIN HCL 0.5 % OP SOLN
OPHTHALMIC | Status: DC | PRN
  Administered 2022-03-08: .2 mL via OPHTHALMIC

## 2022-03-08 MED ORDER — MOXIFLOXACIN HCL 5 MG/ML IO SOLN
INTRAOCULAR | Status: AC
  Filled 2022-03-08: qty 1

## 2022-03-08 MED ORDER — EPINEPHRINE PF 1 MG/ML IJ SOLN
INTRAOCULAR | Status: DC | PRN
  Administered 2022-03-08: 500 mL

## 2022-03-08 MED ORDER — MIDAZOLAM HCL 2 MG/2ML IJ SOLN
INTRAMUSCULAR | Status: AC
  Filled 2022-03-08: qty 2

## 2022-03-08 MED ORDER — BSS IO SOLN
INTRAOCULAR | Status: DC | PRN
  Administered 2022-03-08: 15 mL via INTRAOCULAR

## 2022-03-08 MED ORDER — TROPICAMIDE 1 % OP SOLN
1.0000 [drp] | OPHTHALMIC | Status: AC | PRN
  Administered 2022-03-08 (×3): 1 [drp] via OPHTHALMIC

## 2022-03-08 MED ORDER — TETRACAINE HCL 0.5 % OP SOLN
1.0000 [drp] | OPHTHALMIC | Status: AC | PRN
  Administered 2022-03-08 (×3): 1 [drp] via OPHTHALMIC

## 2022-03-08 MED ORDER — PHENYLEPHRINE-KETOROLAC 1-0.3 % IO SOLN
INTRAOCULAR | Status: AC
  Filled 2022-03-08: qty 4

## 2022-03-08 MED ORDER — EPINEPHRINE PF 1 MG/ML IJ SOLN
INTRAMUSCULAR | Status: AC
  Filled 2022-03-08: qty 1

## 2022-03-08 SURGICAL SUPPLY — 16 items
CATARACT SUITE SIGHTPATH (MISCELLANEOUS) ×1 IMPLANT
CLOTH BEACON ORANGE TIMEOUT ST (SAFETY) ×2 IMPLANT
DRAPE HALF SHEET 40X57 (DRAPES) IMPLANT
EYE SHIELD UNIVERSAL CLEAR (GAUZE/BANDAGES/DRESSINGS) IMPLANT
FEE CATARACT SUITE SIGHTPATH (MISCELLANEOUS) ×2 IMPLANT
GLOVE BIOGEL PI IND STRL 6.5 (GLOVE) IMPLANT
GLOVE BIOGEL PI IND STRL 7.0 (GLOVE) ×4 IMPLANT
LENS IOL RAYNER 15.0 (Intraocular Lens) ×1 IMPLANT
LENS IOL RAYONE EMV 15.0 (Intraocular Lens) IMPLANT
NDL HYPO 18GX1.5 BLUNT FILL (NEEDLE) ×2 IMPLANT
NEEDLE HYPO 18GX1.5 BLUNT FILL (NEEDLE) ×1 IMPLANT
PAD ARMBOARD 7.5X6 YLW CONV (MISCELLANEOUS) ×2 IMPLANT
SYR TB 1ML LL NO SAFETY (SYRINGE) ×2 IMPLANT
TAPE SURG TRANSPORE 1 IN (GAUZE/BANDAGES/DRESSINGS) IMPLANT
TAPE SURGICAL TRANSPORE 1 IN (GAUZE/BANDAGES/DRESSINGS) ×1
WATER STERILE IRR 250ML POUR (IV SOLUTION) ×2 IMPLANT

## 2022-03-08 NOTE — Op Note (Signed)
Date of procedure: 03/08/22  Pre-operative diagnosis: Visually significant age-related combined cataract, Left Eye (H25.812)  Post-operative diagnosis: Visually significant age-related combined cataract, Left Eye (H25.812)  Procedure: Removal of cataract via phacoemulsification and insertion of intra-ocular lens Rayner RAO200E +15.0D into the capsular bag of the Left Eye  Attending surgeon: Gerda Diss. Jarnell Cordaro, MD, MA  Anesthesia: MAC, Topical Akten  Complications: None  Estimated Blood Loss: <27m (minimal)  Specimens: None  Implants: As above  Indications:  Visually significant age-related cataract, Left Eye  Procedure:  The patient was seen and identified in the pre-operative area. The operative eye was identified and dilated.  The operative eye was marked.  Topical anesthesia was administered to the operative eye.     The patient was then to the operative suite and placed in the supine position.  A timeout was performed confirming the patient, procedure to be performed, and all other relevant information.   The patient's face was prepped and draped in the usual fashion for intra-ocular surgery.  A lid speculum was placed into the operative eye and the surgical microscope moved into place and focused.  An inferotemporal paracentesis was created using a 20 gauge paracentesis blade.  Shugarcaine was injected into the anterior chamber.  Viscoelastic was injected into the anterior chamber.  A temporal clear-corneal main wound incision was created using a 2.483mmicrokeratome.  A continuous curvilinear capsulorrhexis was initiated using an irrigating cystitome and completed using capsulorrhexis forceps.  Hydrodissection and hydrodeliniation were performed.  Viscoelastic was injected into the anterior chamber.  A phacoemulsification handpiece and a chopper as a second instrument were used to remove the nucleus and epinucleus. The irrigation/aspiration handpiece was used to remove any remaining  cortical material.   The capsular bag was reinflated with viscoelastic, checked, and found to be intact.  The intraocular lens was inserted into the capsular bag.  The irrigation/aspiration handpiece was used to remove any remaining viscoelastic.  The clear corneal wound and paracentesis wounds were then hydrated and checked with Weck-Cels to be watertight. 0.34m44mf moxifloxacin was injected into the anterior chamber. The lid-speculum was removed.  The drape was removed.  The patient's face was cleaned with a wet and dry 4x4.    A clear shield was taped over the eye. The patient was taken to the post-operative care unit in good condition, having tolerated the procedure well.  Post-Op Instructions: The [facility] post[facility]operative evaluation and will receive all other orders and instructions.

## 2022-03-08 NOTE — Anesthesia Preprocedure Evaluation (Signed)
Anesthesia Evaluation  Patient identified by MRN, date of birth, ID band Patient awake    Reviewed: Allergy & Precautions, H&P , NPO status , Patient's Chart, lab work & pertinent test results, reviewed documented beta blocker date and time   Airway Mallampati: II  TM Distance: >3 FB Neck ROM: full    Dental no notable dental hx.    Pulmonary neg pulmonary ROS, shortness of breath, COPD, Current Smoker   Pulmonary exam normal breath sounds clear to auscultation       Cardiovascular Exercise Tolerance: Good hypertension, negative cardio ROS  Rhythm:regular Rate:Normal     Neuro/Psych  PSYCHIATRIC DISORDERS Anxiety Depression Bipolar Disorder    Neuromuscular disease negative neurological ROS  negative psych ROS   GI/Hepatic negative GI ROS, Neg liver ROS, PUD,GERD  ,,  Endo/Other  negative endocrine ROS    Renal/GU negative Renal ROS  negative genitourinary   Musculoskeletal   Abdominal   Peds  Hematology negative hematology ROS (+)   Anesthesia Other Findings   Reproductive/Obstetrics negative OB ROS                             Anesthesia Physical Anesthesia Plan  ASA: 3  Anesthesia Plan: MAC   Post-op Pain Management:    Induction:   PONV Risk Score and Plan:   Airway Management Planned:   Additional Equipment:   Intra-op Plan:   Post-operative Plan:   Informed Consent: I have reviewed the patients History and Physical, chart, labs and discussed the procedure including the risks, benefits and alternatives for the proposed anesthesia with the patient or authorized representative who has indicated his/her understanding and acceptance.     Dental Advisory Given  Plan Discussed with: CRNA  Anesthesia Plan Comments:        Anesthesia Quick Evaluation

## 2022-03-08 NOTE — Discharge Instructions (Addendum)
Please discharge patient when stable, will follow up today with Dr. Wrzosek at the [facility] [place] office immediately following discharge.  Leave shield in place until visit.  All paperwork with discharge instructions will be given at the office.  [facility] [place] Address:  [address]  [place], [place] [address]  

## 2022-03-08 NOTE — Interval H&P Note (Signed)
History and Physical Interval Note:  03/08/2022 11:15 AM  [name]  has presented today for surgery, with the diagnosis of combined forms age related cataract; left.  The various methods of treatment have been discussed with the patient and family. After consideration of risks, benefits and other options for treatment, the patient has consented to  Procedure(s) with comments: CATARACT EXTRACTION PHACO AND INTRAOCULAR LENS PLACEMENT ([place]) (Left) - CDE as a surgical intervention.  The patient's history has been reviewed, patient examined, no change in status, stable for surgery.  I have reviewed the patient's chart and labs.  Questions were answered to the patient's satisfaction.     Baruch Goldmann

## 2022-03-08 NOTE — Transfer of Care (Signed)
Immediate Anesthesia Transfer of Care Note  Patient: [name]  Procedure(s) Performed: CATARACT EXTRACTION PHACO AND INTRAOCULAR LENS PLACEMENT ([facility]) (Left: Eye)  Patient Location: PACU  Anesthesia Type:MAC  Level of Consciousness: awake, alert , and oriented  Airway & Oxygen Therapy: Patient Spontanous Breathing  Post-op Assessment: Report given to RN and Post -op Vital signs reviewed and stable  Post vital signs: Reviewed and stable  Last Vitals:  Vitals Value Taken Time  BP    Temp    Pulse    Resp    SpO2      Last Pain:  Vitals:   03/08/22 1026  TempSrc: Oral  PainSc: 0-No pain      Patients Stated Pain Goal: 3 (99/35/70 1779)  Complications: No notable events documented.

## 2022-03-08 NOTE — Anesthesia Postprocedure Evaluation (Signed)
Anesthesia Post Note  Patient: TANIKKA BRESNAN  Procedure(s) Performed: CATARACT EXTRACTION PHACO AND INTRAOCULAR LENS PLACEMENT ([facility]) (Left: Eye)  Patient location during evaluation: Phase II Anesthesia Type: MAC Level of consciousness: awake Pain management: pain level controlled Vital Signs Assessment: post-procedure vital signs reviewed and stable Respiratory status: spontaneous breathing and respiratory function stable Cardiovascular status: blood pressure returned to baseline and stable Postop Assessment: no headache and no apparent nausea or vomiting Anesthetic complications: no Comments: Late entry   No notable events documented.   Last Vitals:  Vitals:   03/08/22 1026 03/08/22 1141  BP: 130/64 124/70  Pulse: (!) 55 (!) 52  Resp: 19 18  Temp: 36.6 C 36.6 C  SpO2: 93% 97%    Last Pain:  Vitals:   03/08/22 1141  TempSrc: Axillary  PainSc: 0-No pain                 Louann Sjogren

## 2022-03-12 ENCOUNTER — Encounter (HOSPITAL_COMMUNITY): Payer: Self-pay | Admitting: Ophthalmology

## 2022-03-15 ENCOUNTER — Encounter (HOSPITAL_COMMUNITY)
Admission: RE | Admit: 2022-03-15 | Discharge: 2022-03-15 | Disposition: A | Discharge status: Home / Self Care | Payer: Medicaid Other | Source: Ambulatory Visit | Attending: Ophthalmology | Admitting: Ophthalmology

## 2022-03-15 ENCOUNTER — Encounter (HOSPITAL_COMMUNITY): Payer: Self-pay

## 2022-03-16 NOTE — H&P (Addendum)
Surgical History & Physical  Patient Name: [name] DOB: 02-22-1961  Surgery: Cataract extraction with intraocular lens implant phacoemulsification; Right Eye  Surgeon: Baruch Goldmann MD Surgery Date:  03-22-22 Pre-Op Date:  03-15-22  HPI: A 57 Yr. old female patient 1. The patient is returning after cataract surgery. The left eye is affected. Status post cataract surgery, which began 1 weeks ago: Since the last visit, the affected area is doing well. Patient is following medication instructions: Moxifloxacin TID and Prednisolone TID OS. 2. 2. Patient is having difficulties driving due to glare at night and during the day. Difficulties seeing captions on TV. Poor night vision. This is negatively affecting the patient's quality of life and the patient is unable to function adequately in life with the current level of vision. Patient wishes to proceed with cataract sx OD.  Medical History: Cataract Fibromyalgia High Blood Pressure LDL Lung Problems  Review of Systems Negative Allergic/Immunologic Negative Cardiovascular Negative Constitutional Negative Ear, Nose, Mouth & Throat Negative Endocrine Negative Eyes Negative Gastrointestinal Negative Genitourinary Negative Hemotologic/Lymphatic Negative Integumentary Negative Musculoskeletal Negative Neurological Negative Psychiatry Negative Respiratory  Social   Current every day smoker   Medication Moxifloxacin, Prednisolone,  Cymbalta, HCTZ, Atorvastatin, Singulair,   Sx/Procedures Phaco c IOL OS,   Drug Allergies  Codeine,   History & Physical: Heent: Cataract, right eye NECK: supple without bruits LUNGS: lungs clear to auscultation CV: regular rate and rhythm Abdomen: soft and non-tender Impression & Plan: Assessment: 1.  CATARACT EXTRACTION STATUS; Left Eye (Z98.42) 2.  INTRAOCULAR LENS IOL (Z96.1) 3.  COMBINED FORMS AGE RELATED CATARACT; Both Eyes (H25.813)  Plan: 1.  1 week after cataract surgery.  Doing well with improved vision and normal eye pressure. Call with any problems or concerns. Stop Vigamox Continue Pred Acetate 1 drop 2x/day for 3 more weeks.  2.  Doing well since surgery Continue Post-op medications  3.  Cataract accounts for the patient's decreased vision. This visual impairment is not correctable with a tolerable change in glasses or contact lenses. Cataract surgery with an implantation of a new lens should significantly improve the visual and functional status of the patient. Discussed all risks, benefits, alternatives, and potential complications. Discussed the procedures and recovery. Patient desires to have surgery. A-scan ordered and performed today for intra-ocular lens calculations. The surgery will be performed in order to improve vision for driving, reading, and for eye examinations. Recommend phacoemulsification with intra-ocular lens. Recommend Dextenza for post-operative pain and inflammation. Right Eye. Surgery required to correct imbalance of vision. Dilates well - shugarcaine by protocol. Omidria.

## 2022-03-22 ENCOUNTER — Ambulatory Visit (HOSPITAL_COMMUNITY): Payer: Medicaid Other | Admitting: Anesthesiology

## 2022-03-22 ENCOUNTER — Ambulatory Visit (HOSPITAL_BASED_OUTPATIENT_CLINIC_OR_DEPARTMENT_OTHER): Payer: Medicaid Other | Admitting: Anesthesiology

## 2022-03-22 ENCOUNTER — Ambulatory Visit (HOSPITAL_COMMUNITY)
Admission: RE | Admit: 2022-03-22 | Discharge: 2022-03-22 | Disposition: A | Discharge status: Home / Self Care | Payer: Medicaid Other | Attending: Ophthalmology | Admitting: Ophthalmology

## 2022-03-22 ENCOUNTER — Encounter (HOSPITAL_COMMUNITY): Payer: Self-pay | Admitting: Ophthalmology

## 2022-03-22 ENCOUNTER — Encounter (HOSPITAL_COMMUNITY)
Admission: RE | Disposition: A | Discharge status: Home / Self Care | Payer: Self-pay | Source: Home / Self Care | Attending: Ophthalmology

## 2022-03-22 DIAGNOSIS — H25811 Combined forms of age-related cataract, right eye: Secondary | ICD-10-CM

## 2022-03-22 DIAGNOSIS — J449 Chronic obstructive pulmonary disease, unspecified: Secondary | ICD-10-CM | POA: Diagnosis not present

## 2022-03-22 DIAGNOSIS — M1990 Unspecified osteoarthritis, unspecified site: Secondary | ICD-10-CM | POA: Diagnosis not present

## 2022-03-22 DIAGNOSIS — M797 Fibromyalgia: Secondary | ICD-10-CM | POA: Insufficient documentation

## 2022-03-22 DIAGNOSIS — F17210 Nicotine dependence, cigarettes, uncomplicated: Secondary | ICD-10-CM | POA: Diagnosis not present

## 2022-03-22 DIAGNOSIS — Z8541 Personal history of malignant neoplasm of cervix uteri: Secondary | ICD-10-CM | POA: Diagnosis not present

## 2022-03-22 DIAGNOSIS — Z79899 Other long term (current) drug therapy: Secondary | ICD-10-CM | POA: Diagnosis not present

## 2022-03-22 DIAGNOSIS — K219 Gastro-esophageal reflux disease without esophagitis: Secondary | ICD-10-CM | POA: Diagnosis not present

## 2022-03-22 DIAGNOSIS — I10 Essential (primary) hypertension: Secondary | ICD-10-CM | POA: Insufficient documentation

## 2022-03-22 DIAGNOSIS — F17200 Nicotine dependence, unspecified, uncomplicated: Secondary | ICD-10-CM | POA: Diagnosis not present

## 2022-03-22 DIAGNOSIS — F319 Bipolar disorder, unspecified: Secondary | ICD-10-CM | POA: Insufficient documentation

## 2022-03-22 DIAGNOSIS — F419 Anxiety disorder, unspecified: Secondary | ICD-10-CM | POA: Insufficient documentation

## 2022-03-22 HISTORY — PX: CATARACT EXTRACTION W/PHACO: SHX586

## 2022-03-22 SURGERY — PHACOEMULSIFICATION, CATARACT, WITH IOL INSERTION
Anesthesia: Monitor Anesthesia Care | Site: Eye | Laterality: Right

## 2022-03-22 MED ORDER — SODIUM HYALURONATE 23MG/ML IO SOSY
PREFILLED_SYRINGE | INTRAOCULAR | Status: DC | PRN
  Administered 2022-03-22: .6 mL via INTRAOCULAR

## 2022-03-22 MED ORDER — EPINEPHRINE PF 1 MG/ML IJ SOLN
INTRAMUSCULAR | Status: AC
  Filled 2022-03-22: qty 1

## 2022-03-22 MED ORDER — POVIDONE-IODINE 5 % OP SOLN
OPHTHALMIC | Status: DC | PRN
  Administered 2022-03-22: 1 via OPHTHALMIC

## 2022-03-22 MED ORDER — PHENYLEPHRINE-KETOROLAC 1-0.3 % IO SOLN
INTRAOCULAR | Status: DC | PRN
  Administered 2022-03-22: 500 mL via OPHTHALMIC

## 2022-03-22 MED ORDER — LIDOCAINE HCL (PF) 1 % IJ SOLN
INTRAOCULAR | Status: DC | PRN
  Administered 2022-03-22: 1 mL via OPHTHALMIC

## 2022-03-22 MED ORDER — PHENYLEPHRINE-KETOROLAC 1-0.3 % IO SOLN
INTRAOCULAR | Status: AC
  Filled 2022-03-22: qty 4

## 2022-03-22 MED ORDER — MIDAZOLAM HCL 5 MG/5ML IJ SOLN
INTRAMUSCULAR | Status: DC | PRN
  Administered 2022-03-22: 2 mg via INTRAVENOUS

## 2022-03-22 MED ORDER — BSS IO SOLN
INTRAOCULAR | Status: DC | PRN
  Administered 2022-03-22: 15 mL via INTRAOCULAR

## 2022-03-22 MED ORDER — TROPICAMIDE 1 % OP SOLN
1.0000 [drp] | OPHTHALMIC | Status: AC | PRN
  Administered 2022-03-22 (×3): 1 [drp] via OPHTHALMIC
  Filled 2022-03-22: qty 3

## 2022-03-22 MED ORDER — STERILE WATER FOR IRRIGATION IR SOLN
Status: DC | PRN
  Administered 2022-03-22: 500 mL

## 2022-03-22 MED ORDER — LIDOCAINE HCL 3.5 % OP GEL
1.0000 | Freq: Once | OPHTHALMIC | Status: AC
  Administered 2022-03-22: 1 via OPHTHALMIC

## 2022-03-22 MED ORDER — SODIUM HYALURONATE 10 MG/ML IO SOLUTION
PREFILLED_SYRINGE | INTRAOCULAR | Status: DC | PRN
  Administered 2022-03-22: .85 mL via INTRAOCULAR

## 2022-03-22 MED ORDER — MOXIFLOXACIN HCL 5 MG/ML IO SOLN
INTRAOCULAR | Status: AC
  Filled 2022-03-22: qty 1

## 2022-03-22 MED ORDER — TETRACAINE HCL 0.5 % OP SOLN
1.0000 [drp] | OPHTHALMIC | Status: AC | PRN
  Administered 2022-03-22 (×3): 1 [drp] via OPHTHALMIC

## 2022-03-22 MED ORDER — PHENYLEPHRINE HCL 2.5 % OP SOLN
1.0000 [drp] | OPHTHALMIC | Status: AC | PRN
  Administered 2022-03-22 (×3): 1 [drp] via OPHTHALMIC

## 2022-03-22 MED ORDER — MOXIFLOXACIN HCL 0.5 % OP SOLN
OPHTHALMIC | Status: DC | PRN
  Administered 2022-03-22: .3 mL via OPHTHALMIC

## 2022-03-22 MED ORDER — MIDAZOLAM HCL 2 MG/2ML IJ SOLN
INTRAMUSCULAR | Status: AC
  Filled 2022-03-22: qty 2

## 2022-03-22 SURGICAL SUPPLY — 16 items
CATARACT SUITE SIGHTPATH (MISCELLANEOUS) ×1 IMPLANT
CLOTH BEACON ORANGE TIMEOUT ST (SAFETY) ×2 IMPLANT
EYE SHIELD UNIVERSAL CLEAR (GAUZE/BANDAGES/DRESSINGS) IMPLANT
FEE CATARACT SUITE SIGHTPATH (MISCELLANEOUS) ×2 IMPLANT
GLOVE BIOGEL PI IND STRL 7.0 (GLOVE) ×4 IMPLANT
GLOVE SS BIOGEL STRL SZ 6.5 (GLOVE) IMPLANT
LENS IOL RAYNER 15.5 (Intraocular Lens) ×1 IMPLANT
LENS IOL RAYONE EMV 15.5 (Intraocular Lens) IMPLANT
NDL HYPO 18GX1.5 BLUNT FILL (NEEDLE) ×2 IMPLANT
NEEDLE HYPO 18GX1.5 BLUNT FILL (NEEDLE) ×1 IMPLANT
PAD ARMBOARD 7.5X6 YLW CONV (MISCELLANEOUS) ×2 IMPLANT
SYR TB 1ML LL NO SAFETY (SYRINGE) ×2 IMPLANT
TAPE SURG TRANSPORE 1 IN (GAUZE/BANDAGES/DRESSINGS) IMPLANT
TAPE SURGICAL TRANSPORE 1 IN (GAUZE/BANDAGES/DRESSINGS) ×1
TIP IRRIGATON/ASPIRATION (MISCELLANEOUS) IMPLANT
WATER STERILE IRR 250ML POUR (IV SOLUTION) ×2 IMPLANT

## 2022-03-22 NOTE — Anesthesia Postprocedure Evaluation (Signed)
Anesthesia Post Note  Patient: [name]  Procedure(s) Performed: CATARACT EXTRACTION PHACO AND INTRAOCULAR LENS PLACEMENT ([facility]) (Right: Eye)  Patient location during evaluation: Phase II Anesthesia Type: MAC Level of consciousness: awake and alert and oriented Pain management: pain level controlled Vital Signs Assessment: post-procedure vital signs reviewed and stable Respiratory status: spontaneous breathing, nonlabored ventilation and respiratory function stable Cardiovascular status: blood pressure returned to baseline and stable Postop Assessment: no apparent nausea or vomiting Anesthetic complications: no  No notable events documented.   Last Vitals:  Vitals:   03/22/22 0856 03/22/22 1016  BP: 133/82 (!) 150/73  Pulse: (!) 52 (!) 56  Resp: 16 18  Temp: 37.1 C 37 C  SpO2: 98% 98%    Last Pain:  Vitals:   03/22/22 1016  TempSrc: Oral  PainSc: 0-No pain                 Drewey Begue C Susan Arana

## 2022-03-22 NOTE — Interval H&P Note (Signed)
History and Physical Interval Note:  03/22/2022 9:46 AM  [name]  has presented today for surgery, with the diagnosis of combined forms age related cataract; right.  The various methods of treatment have been discussed with the patient and family. After consideration of risks, benefits and other options for treatment, the patient has consented to  Procedure(s) with comments: CATARACT EXTRACTION PHACO AND INTRAOCULAR LENS PLACEMENT ([facility]) (Right) - right as a surgical intervention.  The patient's history has been reviewed, patient examined, no change in status, stable for surgery.  I have reviewed the patient's chart and labs.  Questions were answered to the patient's satisfaction.     Baruch Goldmann

## 2022-03-22 NOTE — Anesthesia Preprocedure Evaluation (Addendum)
Anesthesia Evaluation  Patient identified by MRN, date of birth, ID band Patient awake    Reviewed: Allergy & Precautions, H&P , NPO status , Patient's Chart, lab work & pertinent test results  Airway Mallampati: II  TM Distance: >3 FB Neck ROM: Full    Dental  (+) Dental Advisory Given, Missing   Pulmonary shortness of breath and with exertion, COPD,  COPD inhaler, Current Smoker   Pulmonary exam normal breath sounds clear to auscultation       Cardiovascular Exercise Tolerance: Good hypertension, Pt. on medications Normal cardiovascular exam Rhythm:Regular Rate:Normal     Neuro/Psych  PSYCHIATRIC DISORDERS Anxiety Depression Bipolar Disorder    Neuromuscular disease    GI/Hepatic Neg liver ROS, PUD,GERD  Medicated,,  Endo/Other  negative endocrine ROS    Renal/GU negative Renal ROS  Female GU complaint (cervical cancer)     Musculoskeletal  (+) Arthritis , Osteoarthritis,  Fibromyalgia -  Abdominal   Peds negative pediatric ROS (+)  Hematology negative hematology ROS (+)   Anesthesia Other Findings   Reproductive/Obstetrics negative OB ROS                             Anesthesia Physical Anesthesia Plan  ASA: 3  Anesthesia Plan: MAC   Post-op Pain Management: Minimal or no pain anticipated   Induction:   PONV Risk Score and Plan: 0  Airway Management Planned: Natural Airway and Nasal Cannula  Additional Equipment:   Intra-op Plan:   Post-operative Plan:   Informed Consent: I have reviewed the patients History and Physical, chart, labs and discussed the procedure including the risks, benefits and alternatives for the proposed anesthesia with the patient or authorized representative who has indicated his/her understanding and acceptance.     Dental advisory given  Plan Discussed with: CRNA and Surgeon  Anesthesia Plan Comments:        Anesthesia Quick  Evaluation

## 2022-03-22 NOTE — Anesthesia Procedure Notes (Signed)
Procedure Name: MAC Date/Time: 03/22/2022 10:00 AM  Performed by: Annamary Carolin, CRNAPre-anesthesia Checklist: Patient identified, Emergency Drugs available, Suction available and Patient being monitored Oxygen Delivery Method: Nasal cannula Induction Type: IV induction Placement Confirmation: positive ETCO2 Dental Injury: Teeth and Oropharynx as per pre-operative assessment

## 2022-03-22 NOTE — Discharge Instructions (Addendum)
Please discharge patient when stable, will follow up today with Dr. Wrzosek at the [facility] office immediately following discharge.  Leave shield in place until visit.  All paperwork with discharge instructions will be given at the office.  [facility] Address:  [address]  [place], [place] [address]  

## 2022-03-22 NOTE — Transfer of Care (Signed)
Immediate Anesthesia Transfer of Care Note  Patient: [name]  Procedure(s) Performed: CATARACT EXTRACTION PHACO AND INTRAOCULAR LENS PLACEMENT ([facility]) (Right: Eye)  Patient Location: PACU  Anesthesia Type:MAC  Level of Consciousness: awake, alert , and oriented  Airway & Oxygen Therapy: Patient Spontanous Breathing  Post-op Assessment: Report given to RN, Post -op Vital signs reviewed and stable, and Patient moving all extremities X 4  Post vital signs: Reviewed and stable  Last Vitals:  Vitals Value Taken Time  BP    Temp    Pulse    Resp    SpO2      Last Pain:  Vitals:   03/22/22 0856  TempSrc: Oral  PainSc: 2       Patients Stated Pain Goal: 6 (29/24/46 2863)  Complications: No notable events documented. VSS

## 2022-03-22 NOTE — Op Note (Signed)
Date of procedure: 03/22/22  Pre-operative diagnosis:  Visually significant combined form age-related cataract, Right Eye (H25.811)  Post-operative diagnosis:  Visually significant combined form age-related cataract, Right Eye (H25.811)  Procedure: Removal of cataract via phacoemulsification and insertion of intra-ocular lens Rayner RAO200E +15.5D into the capsular bag of the Right Eye  Attending surgeon: Gerda Diss. Maxen Rowland, MD, MA  Anesthesia: MAC, Topical Akten, Omidria  Complications: None  Estimated Blood Loss: <50m (minimal)  Specimens: None  Implants: As above  Indications:  Visually significant age-related cataract, Right Eye  Procedure:  The patient was seen and identified in the pre-operative area. The operative eye was identified and dilated.  The operative eye was marked.  Topical anesthesia was administered to the operative eye.     The patient was then to the operative suite and placed in the supine position.  A timeout was performed confirming the patient, procedure to be performed, and all other relevant information.   The patient's face was prepped and draped in the usual fashion for intra-ocular surgery.  A lid speculum was placed into the operative eye and the surgical microscope moved into place and focused.  A superotemporal paracentesis was created using a 20 gauge paracentesis blade.  Shugarcaine was injected into the anterior chamber.  Viscoelastic was injected into the anterior chamber.  A temporal clear-corneal main wound incision was created using a 2.450mmicrokeratome.  A continuous curvilinear capsulorrhexis was initiated using an irrigating cystitome and completed using capsulorrhexis forceps.  Hydrodissection and hydrodeliniation were performed.  Viscoelastic was injected into the anterior chamber.  A phacoemulsification handpiece and a chopper as a second instrument were used to remove the nucleus and epinucleus. The irrigation/aspiration handpiece was used to  remove any remaining cortical material.   The capsular bag was reinflated with viscoelastic, checked, and found to be intact.  The intraocular lens was inserted into the capsular bag.  The irrigation/aspiration handpiece was used to remove any remaining viscoelastic.  The clear corneal wound and paracentesis wounds were then hydrated and checked with Weck-Cels to be watertight. 0.9m60mf moxifloxacin was injected into the anterior chamber. The lid-speculum was removed.  The drape was removed.  The patient's face was cleaned with a wet and dry 4x4. A clear shield was taped over the eye. The patient was taken to the post-operative care unit in good condition, having tolerated the procedure well.  Post-Op Instructions: The [facility] will [facility] same [facility] post-operative evaluation and will receive all other orders and instructions.

## 2022-03-26 ENCOUNTER — Encounter (HOSPITAL_COMMUNITY): Payer: Self-pay | Admitting: Ophthalmology

## 2022-06-24 ENCOUNTER — Encounter: Payer: Self-pay | Admitting: Radiology

## 2023-03-22 ENCOUNTER — Encounter (HOSPITAL_COMMUNITY): Payer: Self-pay

## 2023-03-22 ENCOUNTER — Emergency Department (HOSPITAL_COMMUNITY)
Admission: EM | Admit: 2023-03-22 | Discharge: 2023-03-22 | Disposition: A | Discharge status: Home / Self Care | Payer: MEDICAID | Attending: Emergency Medicine | Admitting: Emergency Medicine

## 2023-03-22 ENCOUNTER — Other Ambulatory Visit: Payer: Self-pay

## 2023-03-22 ENCOUNTER — Emergency Department (HOSPITAL_COMMUNITY): Payer: MEDICAID

## 2023-03-22 DIAGNOSIS — J449 Chronic obstructive pulmonary disease, unspecified: Secondary | ICD-10-CM | POA: Insufficient documentation

## 2023-03-22 DIAGNOSIS — Z8541 Personal history of malignant neoplasm of cervix uteri: Secondary | ICD-10-CM | POA: Insufficient documentation

## 2023-03-22 DIAGNOSIS — Z7982 Long term (current) use of aspirin: Secondary | ICD-10-CM | POA: Insufficient documentation

## 2023-03-22 DIAGNOSIS — R079 Chest pain, unspecified: Secondary | ICD-10-CM | POA: Diagnosis present

## 2023-03-22 DIAGNOSIS — Z79899 Other long term (current) drug therapy: Secondary | ICD-10-CM | POA: Diagnosis not present

## 2023-03-22 DIAGNOSIS — R0602 Shortness of breath: Secondary | ICD-10-CM | POA: Insufficient documentation

## 2023-03-22 DIAGNOSIS — J811 Chronic pulmonary edema: Secondary | ICD-10-CM

## 2023-03-22 DIAGNOSIS — I10 Essential (primary) hypertension: Secondary | ICD-10-CM | POA: Insufficient documentation

## 2023-03-22 LAB — CBC
HCT: 39.4 % (ref 36.0–46.0)
Hemoglobin: 13 g/dL (ref 12.0–15.0)
MCH: 29.9 pg (ref 26.0–34.0)
MCHC: 33 g/dL (ref 30.0–36.0)
MCV: 90.6 fL (ref 80.0–100.0)
Platelets: 320 10*3/uL (ref 150–400)
RBC: 4.35 MIL/uL (ref 3.87–5.11)
RDW: 15.6 % — ABNORMAL HIGH (ref 11.5–15.5)
WBC: 7.8 10*3/uL (ref 4.0–10.5)
nRBC: 0 % (ref 0.0–0.2)

## 2023-03-22 LAB — CBG MONITORING, ED: Glucose-Capillary: 111 mg/dL — ABNORMAL HIGH (ref 70–99)

## 2023-03-22 LAB — BASIC METABOLIC PANEL
Anion gap: 4 — ABNORMAL LOW (ref 5–15)
BUN: 16 mg/dL (ref 8–23)
CO2: 26 mmol/L (ref 22–32)
Calcium: 8.7 mg/dL — ABNORMAL LOW (ref 8.9–10.3)
Chloride: 106 mmol/L (ref 98–111)
Creatinine, Ser: 0.81 mg/dL (ref 0.44–1.00)
GFR, Estimated: >60 mL/min (ref >60)
Glucose, Bld: 89 mg/dL (ref 70–99)
Potassium: 3.9 mmol/L (ref 3.5–5.1)
Sodium: 136 mmol/L (ref 135–145)

## 2023-03-22 LAB — BRAIN NATRIURETIC PEPTIDE: B Natriuretic Peptide: 224 pg/mL — ABNORMAL HIGH (ref 0.0–100.0)

## 2023-03-22 LAB — TROPONIN I (HIGH SENSITIVITY)
Troponin I (High Sensitivity): 6 ng/L (ref <18)
Troponin I (High Sensitivity): 5 ng/L (ref <18)

## 2023-03-22 LAB — D-DIMER, QUANTITATIVE: D-Dimer, Quant: 0.43 ug{FEU}/mL (ref 0.00–0.50)

## 2023-03-22 MED ORDER — NITROGLYCERIN 0.4 MG SL SUBL
0.4000 mg | SUBLINGUAL_TABLET | Freq: Once | SUBLINGUAL | Status: AC
Start: 2023-03-22 — End: 2023-03-22
  Administered 2023-03-22: 0.4 mg via SUBLINGUAL
  Filled 2023-03-22: qty 1

## 2023-03-22 MED ORDER — FUROSEMIDE 20 MG PO TABS: 20.0000 mg | ORAL_TABLET | Freq: Every day | ORAL | 0 refills | Status: AC

## 2023-03-22 MED ORDER — ONDANSETRON HCL 4 MG/2ML IJ SOLN
4.0000 mg | Freq: Once | INTRAMUSCULAR | Status: AC
  Administered 2023-03-22: 4 mg via INTRAVENOUS
  Filled 2023-03-22: qty 2

## 2023-03-22 MED ORDER — MORPHINE SULFATE (PF) 4 MG/ML IV SOLN
4.0000 mg | Freq: Once | INTRAVENOUS | Status: AC
  Administered 2023-03-22: 4 mg via INTRAVENOUS
  Filled 2023-03-22: qty 1

## 2023-03-22 MED ORDER — FUROSEMIDE 10 MG/ML IJ SOLN
40.0000 mg | Freq: Once | INTRAMUSCULAR | Status: AC
  Administered 2023-03-22: 40 mg via INTRAVENOUS
  Filled 2023-03-22: qty 4

## 2023-03-22 MED ORDER — PREDNISONE 10 MG PO TABS: 40.0000 mg | ORAL_TABLET | Freq: Every day | ORAL | 0 refills | Status: AC

## 2023-03-22 MED ORDER — IPRATROPIUM-ALBUTEROL 0.5-2.5 (3) MG/3ML IN SOLN
3.0000 mL | Freq: Once | RESPIRATORY_TRACT | Status: AC
  Administered 2023-03-22: 3 mL via RESPIRATORY_TRACT
  Filled 2023-03-22: qty 3

## 2023-03-22 NOTE — ED Notes (Signed)
Pt in CXR

## 2023-03-22 NOTE — ED Notes (Signed)
Reviewed D/C information with the patient, pt verbalized understanding. No additional concerns at this time.

## 2023-03-22 NOTE — Discharge Instructions (Addendum)
While you were in the emergency department, you had a chest x-ray that did show a little bit of fluid on your lungs.  You received Lasix, which will help you pee out this excess fluid.  I sent your prescription for Lasix and would like to take it each day for the next 1 week.  Have also sent a prescription for prednisone, which you can take daily for the next 4 days.  Please follow-up with your primary care doctor early next week.  Return to the emergency department if you develop worsening pain in your chest or shortness of breath.  I have also sent you a referral for a heart doctor.  They should contact you next week.

## 2023-03-22 NOTE — ED Provider Notes (Signed)
[place] EMERGENCY DEPARTMENT AT [facility] Provider Note  CSN: [id number] Arrival date & time: 03/22/23 1459  Chief Complaint(s) Chest Pain and Shortness of Breath  HPI [name] is a 62 y.o. female here today for chest pain shortness of breath.  Patient says that started earlier today.  She was walking her car and began to experience pain in the middle of her chest and feels short of breath.  Patient has a history of COPD, smokes half pack per day, does not use oxygen.   Past Medical History Past Medical History:  Diagnosis Date   Allergic rhinitis    Anxiety and depression    Cervical cancer (HCC)    in her 20's   Chronic back pain    Chronic chest wall pain    Chronic pain syndrome    Compartment syndrome (HCC)    COPD (chronic obstructive pulmonary disease) (HCC)    DJD (degenerative joint disease) of lumbar spine    Fibromyalgia    GERD (gastroesophageal reflux disease)    GI bleed    Gout    Hyperlipidemia    Osteoarthritis    Pain management    PUD (peptic ulcer disease)    Vaginal Pap smear, abnormal 10/29/2014   ASCUS negative HPV   Patient Active Problem List   Diagnosis Date Noted   Constipation 01/16/2019   Upper abdominal pain 01/11/2019   Abdominal bloating 01/11/2019   Varicose veins of left lower extremity with complications 03/08/2017   Well woman exam with routine gynecological exam 02/02/2016   Pap smear abnormality of cervix with ASCUS favoring benign 12/18/2014   Carpal tunnel syndrome 09/03/2013   Late effect of tendon injury 09/03/2013   Pain in joint, hand 09/03/2013   Pain in joint, forearm 09/03/2013   Disorder of muscle, ligament, and fascia 09/03/2013   Compartment syndrome, traumatic, upper extremity (HCC) 08/04/2013   Cellulitis of hand, right 08/04/2013   Chronic pain 01/06/2013   Depression 01/06/2013   Hypertension 01/06/2013   Chest pain 07/18/2012   Dyspnea 07/18/2012   VITAMIN B12 DEFICIENCY 08/30/2007    HOARSENESS 08/30/2007   PERIODONTITIS, CHRONIC NOS 12/19/2006   TOBACCO ABUSE 11/22/2006   OSTEOARTHRITIS 11/22/2006   Gout, unspecified 10/31/2006   DISORDER, BIPOLAR NOS 10/31/2006   ANXIETY 10/31/2006   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 Authorizing Provider  ALPRAZolam Prudy Feeler) 1 MG tablet Take 1 tablet (1 mg total) by mouth 2 (two) times daily as needed for anxiety. Patient not taking: Reported on 03/15/2022 08/10/13   Karie Chimera, PA-C  aspirin EC 81 MG tablet Take 81 mg by mouth daily. Swallow whole.    [provider]  busPIRone (BUSPAR) 10 MG tablet Take 10 mg by mouth 3 (three) times daily.    [provider]  DULoxetine (CYMBALTA) 60 MG capsule Take 60 mg by mouth daily.    [provider]  famotidine (PEPCID) 20 MG tablet Take 1 tablet (20 mg total) by mouth 2 (two) times daily. Take one tab by mouth after dinner and one tab at bed time  12/26/18 12/26/19  Arnaldo Natal, NP  furosemide (LASIX) 20 MG tablet Take 20 mg by mouth daily as needed for fluid or edema.  09/28/14   [provider]  hydrochlorothiazide (HYDRODIURIL) 25 MG tablet Take 25 mg by mouth daily.    [provider]  hydrOXYzine (ATARAX) 25 MG tablet Take 25 mg by mouth 3 (three) times daily as needed for anxiety.    [provider]  montelukast (SINGULAIR) 10 MG tablet Take 10 mg by mouth at bedtime.    [provider]  oxycodone (ROXICODONE) 30 MG immediate release tablet Take 30 mg by mouth 3 (three) times daily.    [provider]   pantoprazole (PROTONIX) 40 MG tablet Take 40 mg by mouth 2 (two) times daily.    [provider]  simvastatin (ZOCOR) 20 MG tablet Take 20 mg by mouth daily.    [provider]                                                                                                                                    Past Surgical History Past Surgical History:  Procedure Laterality Date   APPLICATION OF WOUND VAC Right 08/04/2013   Procedure: APPLICATION OF WOUND VAC;  Surgeon: Dominica Severin, MD;  Location: [facility] OR;  Service: Orthopedics;  Laterality: Right;   CATARACT EXTRACTION W/PHACO Left 03/08/2022   Procedure: CATARACT EXTRACTION PHACO AND INTRAOCULAR LENS PLACEMENT (IOC);  Surgeon: Fabio Pierce, MD;  Location: [facility] ORS;  Service: Ophthalmology;  Laterality: Left;  CDE 9.70   CATARACT EXTRACTION W/PHACO Right 03/22/2022   Procedure: CATARACT EXTRACTION PHACO AND INTRAOCULAR LENS PLACEMENT (IOC);  Surgeon: Fabio Pierce, MD;  Location: [facility];  Service: Ophthalmology;  Laterality: Right;  CDE: 9.00   Cervival cone     FASCIOTOMY Right 08/04/2013   Procedure: FASCIOTOMY;  Surgeon: Dominica Severin, MD;  Location: [facility] OR;  Service: Orthopedics;  Laterality: Right;   I & D EXTREMITY Right 08/04/2013   Procedure: IRRIGATION AND DEBRIDEMENT RIGHT HAND AND FOREARM;  Surgeon: Dominica Severin, MD;  Location: [facility] OR;  Service: Orthopedics;  Laterality: Right;   I & D EXTREMITY Right 08/05/2013   Procedure: IRRIGATION AND DEBRIDEMENT RIGHT HAND AND FOREARM with SECONDARY CLOSURE ;  Surgeon: Dominica Severin, MD;  Location: [facility] OR;  Service: Orthopedics;  Laterality: Right;   I & D EXTREMITY Right 08/07/2013   Procedure: IRRIGATION AND DEBRIDEMENT RIGHT ARM, FOREARM, HAND AND SKIN GRAFTING AS NECESSARY TO AFECTED AREAS (FOREARM AND HAND );  Surgeon: Dominica Severin, MD;  Location: [facility] OR;  Service: Orthopedics;  Laterality: Right;  graft from right thigh to arm   SKIN FULL THICKNESS GRAFT Right 08/07/2013    Procedure: AND SKIN GRAFTING AS NECESSARY TO AFECTED AREAS (FOREARM AND HAND );  Surgeon: Dominica Severin, MD;  Location: [facility] OR;  Service: Orthopedics;  Laterality: Right;  skin graft from right thigh   THROAT  SURGERY     THROAT SURGERY  02/23/2018   TOTAL ABDOMINAL HYSTERECTOMY     TUBAL LIGATION     Family History Family History  Problem Relation Age of Onset   Coronary artery disease Father    Hypertension Father    Diabetes Father    Cancer Father    Heart disease Father    Diabetes type II Mother    Alcohol abuse Mother    COPD Mother    Diabetes Sister    Heart disease Sister     Social History Social History   Tobacco Use   Smoking status: Every Day    Current packs/day: 0.50    Average packs/day: 0.5 packs/day for 40.0 years (20.0 ttl pk-yrs)    Types: Cigarettes   Smokeless tobacco: Never  Vaping Use   Vaping status: Never Used  Substance Use Topics   Alcohol use: No   Drug use: No   Allergies Asa [aspirin], Vicodin [hydrocodone-acetaminophen], and Hydrocodone-acetaminophen  Review of Systems Review of Systems  Physical Exam Vital Signs  I have reviewed the triage vital signs BP (!) 155/83 (BP Location: Right Arm)   Pulse (!) 58   Temp 98 F (36.7 C) (Oral)   Resp (!) 22   Ht 5\' 4"  (1.626 m)   Wt 99.8 kg   LMP 09/16/2000   SpO2 98%   BMI 37.76 kg/m   Physical Exam HENT:     Head: Normocephalic.  Cardiovascular:     Rate and Rhythm: Normal rate.     Heart sounds: Normal heart sounds.  Pulmonary:     Effort: Pulmonary effort is normal. No tachypnea or respiratory distress.     Breath sounds: Normal breath sounds. No wheezing.  Musculoskeletal:     Cervical back: Normal range of motion.  Neurological:     Mental Status: She is alert.     ED Results and Treatments Labs (all labs ordered are listed, but only abnormal results are displayed) Labs Reviewed  BASIC METABOLIC PANEL  CBC  BRAIN NATRIURETIC PEPTIDE  TROPONIN I (HIGH  SENSITIVITY)                                                                                                                          Radiology No results found.  Pertinent labs & imaging results that were available during my care of the patient were reviewed by me and considered in my medical decision making (see MDM for details).  Medications Ordered in ED Medications  nitroGLYCERIN (NITROSTAT) SL tablet 0.4 mg (has no administration in time range)  Procedures Procedures  (including critical care time)  Medical Decision Making / ED Course   This patient presents to the ED for concern of shortness of breath, this involves an extensive number of treatment options, and is a complaint that carries with it a high risk of complications and morbidity.  The differential diagnosis includes ACS, COPD exacerbation, pneumonia, less likely PE.  MDM: 62 year old female is here today for shortness of breath and chest pain.  Based on her history and exam, I think that this is most likely to be ACS.  Patient is nontachycardic, lung sounds are clear.  Lower suspicion for PE based on vital signs.  Initial EKG nonischemic.  Troponin ordered.  Have provided patient with nitroglycerin.  Patient declined aspirin due to her history of gastric ulcers.  Reassessment 7 PM-patient was complaining of continued chest pain, so added on a D-dimer which was negative.  Gave the patient Lasix based on her chest x-ray, slightly elevated BNP.  Patient Dors improvement with this, as well as with albuterol.  Believe the patient's symptoms are likely a combination of her underlying COPD, mild fluid overload.  Troponin negative x 2.  Believe patient would benefit more from outpatient follow-up rather than admission.  Will discharge her with a few days of Lasix, cardiology follow-up.  Also give the  patient short course of steroids.  Currently chest pain-free.   Additional history obtained: -Additional history obtained from  -External records from outside source obtained and reviewed including: Chart review including previous notes, labs, imaging, consultation notes   Lab Tests: -I ordered, reviewed, and interpreted labs.   The pertinent results include:   Labs Reviewed  BASIC METABOLIC PANEL  CBC  BRAIN NATRIURETIC PEPTIDE  TROPONIN I (HIGH SENSITIVITY)      EKG my independent review of the patient's EKG shows no ST segment depressions or elevations, no T wave inversions, no evidence of acute ischemia.  EKG Interpretation Date/Time:    Ventricular Rate:    PR Interval:    QRS Duration:    QT Interval:    QTC Calculation:   R Axis:      Text Interpretation:           Imaging Studies ordered: I ordered imaging studies including chest x-ray I independently visualized and interpreted imaging. I agree with the radiologist interpretation   Medicines ordered and prescription drug management: Meds ordered this encounter  Medications   nitroGLYCERIN (NITROSTAT) SL tablet 0.4 mg    -I have reviewed the patients home medicines and have made adjustments as needed   Cardiac Monitoring: The patient was maintained on a cardiac monitor.  I personally viewed and interpreted the cardiac monitored which showed an underlying rhythm of: Normal sinus rhythm  Social Determinants of Health:  Factors impacting patients care include:    Reevaluation: After the interventions noted above, I reevaluated the patient and found that they have :improved  Co morbidities that complicate the patient evaluation  Past Medical History:  Diagnosis Date   Allergic rhinitis    Anxiety and depression    Cervical cancer (HCC)    in her 20's   Chronic back pain    Chronic chest wall pain    Chronic pain syndrome    Compartment syndrome (HCC)    COPD (chronic obstructive pulmonary  disease) (HCC)    DJD (degenerative joint disease) of lumbar spine    Fibromyalgia    GERD (gastroesophageal reflux disease)    GI bleed    Gout  Hyperlipidemia    Osteoarthritis    Pain management    PUD (peptic ulcer disease)    Vaginal Pap smear, abnormal 10/29/2014   ASCUS negative HPV      Dispostion: I considered admission for this patient, however patient is not requiring any supplemental oxygen, has reassuring labs, and a reassuring exam.     Final Clinical Impression(s) / ED Diagnoses Final diagnoses:  None     @PCDICTATION @    Anders Simmonds T, DO 03/22/23 1910

## 2023-03-22 NOTE — ED Triage Notes (Addendum)
Sharp CP center of the chest  7/10 Pt was walking to the car when is started Complains SOB and nausea  Started as a cramp feeling Pt smokes 1/2 PPD
